# Patient Record
Sex: Male | Born: 1940 | Race: Black or African American | Hispanic: No | Smoking: Former smoker
Health system: Southern US, Community
[De-identification: ages and names within clinical notes are randomized; demographics above are authoritative.]

## PROBLEM LIST (undated history)

## (undated) DIAGNOSIS — IMO0002 Reserved for concepts with insufficient information to code with codable children: Secondary | ICD-10-CM

## (undated) DIAGNOSIS — M1990 Unspecified osteoarthritis, unspecified site: Secondary | ICD-10-CM

## (undated) DIAGNOSIS — J449 Chronic obstructive pulmonary disease, unspecified: Secondary | ICD-10-CM

## (undated) DIAGNOSIS — Z720 Tobacco use: Secondary | ICD-10-CM

## (undated) DIAGNOSIS — R55 Syncope and collapse: Secondary | ICD-10-CM

## (undated) DIAGNOSIS — I2510 Atherosclerotic heart disease of native coronary artery without angina pectoris: Secondary | ICD-10-CM

## (undated) DIAGNOSIS — I219 Acute myocardial infarction, unspecified: Secondary | ICD-10-CM

## (undated) DIAGNOSIS — E785 Hyperlipidemia, unspecified: Secondary | ICD-10-CM

## (undated) DIAGNOSIS — I10 Essential (primary) hypertension: Secondary | ICD-10-CM

## (undated) DIAGNOSIS — N183 Chronic kidney disease, stage 3 unspecified: Secondary | ICD-10-CM

## (undated) DIAGNOSIS — I509 Heart failure, unspecified: Secondary | ICD-10-CM

## (undated) DIAGNOSIS — I82409 Acute embolism and thrombosis of unspecified deep veins of unspecified lower extremity: Secondary | ICD-10-CM

## (undated) DIAGNOSIS — R0600 Dyspnea, unspecified: Secondary | ICD-10-CM

## (undated) DIAGNOSIS — R1030 Lower abdominal pain, unspecified: Secondary | ICD-10-CM

## (undated) DIAGNOSIS — R9430 Abnormal result of cardiovascular function study, unspecified: Secondary | ICD-10-CM

## (undated) HISTORY — DX: Lower abdominal pain, unspecified: R10.30

## (undated) HISTORY — DX: Abnormal result of cardiovascular function study, unspecified: R94.30

## (undated) HISTORY — DX: Heart failure, unspecified: I50.9

## (undated) HISTORY — DX: Acute myocardial infarction, unspecified: I21.9

## (undated) HISTORY — DX: Chronic kidney disease, stage 3 (moderate): N18.3

## (undated) HISTORY — DX: Syncope and collapse: R55

## (undated) HISTORY — PX: MULTIPLE TOOTH EXTRACTIONS: SHX2053

## (undated) HISTORY — DX: Reserved for concepts with insufficient information to code with codable children: IMO0002

## (undated) HISTORY — PX: EYE SURGERY: SHX253

## (undated) HISTORY — DX: Hyperlipidemia, unspecified: E78.5

## (undated) HISTORY — DX: Tobacco use: Z72.0

## (undated) HISTORY — DX: Acute embolism and thrombosis of unspecified deep veins of unspecified lower extremity: I82.409

## (undated) HISTORY — DX: Atherosclerotic heart disease of native coronary artery without angina pectoris: I25.10

## (undated) HISTORY — DX: Chronic kidney disease, stage 3 unspecified: N18.30

## (undated) HISTORY — DX: Chronic obstructive pulmonary disease, unspecified: J44.9

## (undated) HISTORY — DX: Essential (primary) hypertension: I10

---

## 2004-12-07 DIAGNOSIS — I219 Acute myocardial infarction, unspecified: Secondary | ICD-10-CM

## 2004-12-07 HISTORY — DX: Acute myocardial infarction, unspecified: I21.9

## 2004-12-07 HISTORY — PX: CORONARY ANGIOPLASTY: SHX604

## 2005-04-19 ENCOUNTER — Inpatient Hospital Stay (HOSPITAL_COMMUNITY): Admission: EM | Admit: 2005-04-19 | Discharge: 2005-04-21 | Payer: Self-pay | Admitting: Emergency Medicine

## 2005-04-20 ENCOUNTER — Ambulatory Visit: Payer: Self-pay | Admitting: *Deleted

## 2005-04-21 ENCOUNTER — Ambulatory Visit: Payer: Self-pay | Admitting: Internal Medicine

## 2005-04-22 ENCOUNTER — Ambulatory Visit: Payer: Self-pay | Admitting: *Deleted

## 2005-05-12 ENCOUNTER — Ambulatory Visit: Payer: Self-pay | Admitting: Internal Medicine

## 2005-05-14 ENCOUNTER — Ambulatory Visit: Payer: Self-pay | Admitting: Cardiology

## 2005-06-03 ENCOUNTER — Ambulatory Visit: Payer: Self-pay | Admitting: Internal Medicine

## 2005-08-28 ENCOUNTER — Ambulatory Visit: Payer: Self-pay | Admitting: Cardiology

## 2005-09-02 ENCOUNTER — Ambulatory Visit: Payer: Self-pay | Admitting: Internal Medicine

## 2005-09-15 ENCOUNTER — Encounter: Payer: Self-pay | Admitting: Cardiology

## 2005-09-15 ENCOUNTER — Ambulatory Visit (HOSPITAL_COMMUNITY): Admission: RE | Admit: 2005-09-15 | Discharge: 2005-09-15 | Payer: Self-pay | Admitting: Cardiology

## 2005-09-15 ENCOUNTER — Ambulatory Visit: Payer: Self-pay | Admitting: Cardiology

## 2005-09-16 ENCOUNTER — Ambulatory Visit: Payer: Self-pay | Admitting: Internal Medicine

## 2005-10-13 ENCOUNTER — Ambulatory Visit: Payer: Self-pay | Admitting: Internal Medicine

## 2005-12-18 ENCOUNTER — Ambulatory Visit: Payer: Self-pay | Admitting: Cardiology

## 2006-09-15 ENCOUNTER — Ambulatory Visit: Payer: Self-pay | Admitting: Cardiology

## 2006-09-16 ENCOUNTER — Ambulatory Visit: Payer: Self-pay | Admitting: Internal Medicine

## 2007-01-27 ENCOUNTER — Ambulatory Visit: Payer: Self-pay | Admitting: Cardiology

## 2007-08-17 ENCOUNTER — Ambulatory Visit: Payer: Self-pay | Admitting: Cardiology

## 2008-03-05 ENCOUNTER — Ambulatory Visit: Payer: Self-pay | Admitting: Cardiology

## 2009-06-28 ENCOUNTER — Encounter (INDEPENDENT_AMBULATORY_CARE_PROVIDER_SITE_OTHER): Payer: Self-pay | Admitting: *Deleted

## 2010-02-21 DIAGNOSIS — I10 Essential (primary) hypertension: Secondary | ICD-10-CM | POA: Insufficient documentation

## 2010-02-23 ENCOUNTER — Encounter: Payer: Self-pay | Admitting: Cardiology

## 2010-02-23 DIAGNOSIS — F17200 Nicotine dependence, unspecified, uncomplicated: Secondary | ICD-10-CM | POA: Insufficient documentation

## 2010-02-23 DIAGNOSIS — E785 Hyperlipidemia, unspecified: Secondary | ICD-10-CM | POA: Insufficient documentation

## 2010-02-24 ENCOUNTER — Ambulatory Visit: Payer: Self-pay | Admitting: Cardiology

## 2010-11-19 ENCOUNTER — Emergency Department (HOSPITAL_COMMUNITY)
Admission: EM | Admit: 2010-11-19 | Discharge: 2010-11-19 | Disposition: A | Discharge status: Other Acute Inpatient Hospital | Payer: Self-pay | Source: Home / Self Care | Admitting: Family Medicine

## 2010-11-19 ENCOUNTER — Encounter (INDEPENDENT_AMBULATORY_CARE_PROVIDER_SITE_OTHER): Payer: Self-pay | Admitting: Emergency Medicine

## 2010-11-19 ENCOUNTER — Observation Stay (HOSPITAL_COMMUNITY)
Admission: EM | Admit: 2010-11-19 | Discharge: 2010-11-20 | Payer: Self-pay | Source: Home / Self Care | Attending: Emergency Medicine | Admitting: Emergency Medicine

## 2010-11-21 ENCOUNTER — Ambulatory Visit: Payer: Self-pay | Admitting: Cardiology

## 2010-11-21 LAB — CONVERTED CEMR LAB
INR: 1.2
POC INR: 1.2

## 2010-11-25 ENCOUNTER — Ambulatory Visit: Payer: Self-pay | Admitting: Cardiology

## 2010-11-25 LAB — CONVERTED CEMR LAB: POC INR: 1.8

## 2010-12-02 ENCOUNTER — Ambulatory Visit: Payer: Self-pay | Admitting: Internal Medicine

## 2010-12-02 LAB — CONVERTED CEMR LAB: POC INR: 2.5

## 2010-12-16 ENCOUNTER — Encounter: Payer: Self-pay | Admitting: Cardiology

## 2010-12-16 ENCOUNTER — Emergency Department (HOSPITAL_COMMUNITY)
Admission: EM | Admit: 2010-12-16 | Discharge: 2010-12-16 | Payer: Self-pay | Source: Home / Self Care | Admitting: Emergency Medicine

## 2010-12-18 ENCOUNTER — Ambulatory Visit: Admission: RE | Admit: 2010-12-18 | Discharge: 2010-12-18 | Payer: Self-pay | Source: Home / Self Care

## 2010-12-18 LAB — CONVERTED CEMR LAB: POC INR: 1.6

## 2010-12-22 LAB — COMPREHENSIVE METABOLIC PANEL
ALT: 18 U/L (ref 0–53)
AST: 25 U/L (ref 0–37)
Albumin: 3.5 g/dL (ref 3.5–5.2)
Alkaline Phosphatase: 129 U/L — ABNORMAL HIGH (ref 39–117)
BUN: 5 mg/dL — ABNORMAL LOW (ref 6–23)
CO2: 23 mEq/L (ref 19–32)
Calcium: 8.8 mg/dL (ref 8.4–10.5)
Chloride: 106 mEq/L (ref 96–112)
Creatinine, Ser: 1.2 mg/dL (ref 0.4–1.5)
GFR calc Af Amer: >60 mL/min (ref >60)
GFR calc non Af Amer: >60 mL/min (ref >60)
Glucose, Bld: 111 mg/dL — ABNORMAL HIGH (ref 70–99)
Potassium: 5 mEq/L (ref 3.5–5.1)
Sodium: 137 mEq/L (ref 135–145)
Total Bilirubin: 0.8 mg/dL (ref 0.3–1.2)
Total Protein: 7.3 g/dL (ref 6.0–8.3)

## 2010-12-22 LAB — DIFFERENTIAL
Basophils Absolute: 0 10*3/uL (ref 0.0–0.1)
Basophils Relative: 1 % (ref 0–1)
Eosinophils Absolute: 0.2 10*3/uL (ref 0.0–0.7)
Eosinophils Relative: 5 % (ref 0–5)
Lymphocytes Relative: 26 % (ref 12–46)
Lymphs Abs: 1.1 10*3/uL (ref 0.7–4.0)
Monocytes Absolute: 0.6 10*3/uL (ref 0.1–1.0)
Monocytes Relative: 14 % — ABNORMAL HIGH (ref 3–12)
Neutro Abs: 2.3 10*3/uL (ref 1.7–7.7)
Neutrophils Relative %: 54 % (ref 43–77)
Smear Review: ADEQUATE

## 2010-12-22 LAB — CBC
HCT: 55.4 % — ABNORMAL HIGH (ref 39.0–52.0)
Hemoglobin: 19.5 g/dL — ABNORMAL HIGH (ref 13.0–17.0)
MCH: 39.2 pg — ABNORMAL HIGH (ref 26.0–34.0)
MCHC: 35.2 g/dL (ref 30.0–36.0)
MCV: 111.5 fL — ABNORMAL HIGH (ref 78.0–100.0)
Platelets: 154 10*3/uL (ref 150–400)
RBC: 4.97 MIL/uL (ref 4.22–5.81)
RDW: 12.2 % (ref 11.5–15.5)
WBC: 4.2 10*3/uL (ref 4.0–10.5)

## 2010-12-22 LAB — POCT CARDIAC MARKERS
CKMB, poc: <1 ng/mL — ABNORMAL LOW (ref 1.0–8.0)
Myoglobin, poc: 53.3 ng/mL (ref 12–200)
Troponin i, poc: <0.05 ng/mL (ref 0.00–0.09)

## 2010-12-22 LAB — PROTIME-INR
INR: 1.66 — ABNORMAL HIGH (ref 0.00–1.49)
Prothrombin Time: 19.8 seconds — ABNORMAL HIGH (ref 11.6–15.2)

## 2010-12-23 ENCOUNTER — Encounter: Payer: Self-pay | Admitting: Cardiology

## 2010-12-23 ENCOUNTER — Ambulatory Visit
Admission: RE | Admit: 2010-12-23 | Discharge: 2010-12-23 | Payer: Self-pay | Source: Home / Self Care | Attending: Cardiology | Admitting: Cardiology

## 2010-12-23 DIAGNOSIS — R079 Chest pain, unspecified: Secondary | ICD-10-CM | POA: Insufficient documentation

## 2010-12-30 ENCOUNTER — Telehealth (INDEPENDENT_AMBULATORY_CARE_PROVIDER_SITE_OTHER): Payer: Self-pay | Admitting: *Deleted

## 2010-12-31 ENCOUNTER — Ambulatory Visit
Admission: RE | Admit: 2010-12-31 | Discharge: 2010-12-31 | Payer: Self-pay | Source: Home / Self Care | Attending: Cardiovascular Disease | Admitting: Cardiovascular Disease

## 2010-12-31 ENCOUNTER — Encounter: Payer: Self-pay | Admitting: Cardiology

## 2010-12-31 ENCOUNTER — Encounter: Payer: Self-pay | Admitting: *Deleted

## 2010-12-31 ENCOUNTER — Encounter (HOSPITAL_COMMUNITY)
Admission: RE | Admit: 2010-12-31 | Discharge: 2011-01-06 | Payer: Self-pay | Source: Home / Self Care | Attending: Cardiology | Admitting: Cardiology

## 2010-12-31 ENCOUNTER — Ambulatory Visit: Admission: RE | Admit: 2010-12-31 | Discharge: 2010-12-31 | Payer: Self-pay | Source: Home / Self Care

## 2010-12-31 LAB — CONVERTED CEMR LAB: POC INR: 2.2

## 2011-01-01 ENCOUNTER — Ambulatory Visit: Admit: 2011-01-01 | Payer: Self-pay

## 2011-01-06 NOTE — Miscellaneous (Signed)
  Clinical Lists Changes  Problems: Added new problem of CAD (ICD-414.00) Added new problem of DYSLIPIDEMIA (ICD-272.4) Added new problem of TOBACCO ABUSE (ICD-305.1) Observations: Added new observation of PAST MED HX: HYPERTENSION, UNSPECIFIED  CAD...Marland KitchenMarland KitchenNon-ST elevation MI in May 2006 treated with a bare metal stent.  LV   mild dysfunction...echo..09/2005...50-55%...tech difficult Lipid abnormalities Tobacco abuse Etoh use (02/23/2010 20:39)       Past History:  Past Medical History: HYPERTENSION, UNSPECIFIED  CAD...Marland KitchenMarland KitchenNon-ST elevation MI in May 2006 treated with a bare metal stent.  LV   mild dysfunction...echo..09/2005...50-55%...tech difficult Lipid abnormalities Tobacco abuse Etoh use

## 2011-01-06 NOTE — Assessment & Plan Note (Signed)
Summary: F2Y  Medications Added ASPIRIN 81 MG TBEC (ASPIRIN) Take one tablet by mouth daily      Allergies Added: NKDA  Visit Type:  Follow-up Primary Provider:  [facility]  CC:  CAD.  History of Present Illness: The patient is seen for followup of coronary artery disease.  He had a non-ST elevation MI in 2006.  He received a bare-metal stent at that time. He has not had any recurrent chest pain since then.  Current Medications (verified): 1)  Metoprolol Succinate 50 Mg Xr24h-Tab (Metoprolol Succinate) .... Take One Tablet By Mouth Daily 2)  Lisinopril 10 Mg Tabs (Lisinopril) .... Take One Tablet By Mouth Daily 3)  Furosemide 20 Mg Tabs (Furosemide) .... Take One Tablet By Mouth Daily. 4)  Zetia 10 Mg Tabs (Ezetimibe) .... Take One Tablet By Mouth Daily. 5)  Aspirin 81 Mg Tbec (Aspirin) .... Take One Tablet By Mouth Daily  Allergies (verified): No Known Drug Allergies  Past History:  Past Medical History: Last updated: 02/23/2010 HYPERTENSION, UNSPECIFIED  CAD...Marland KitchenMarland KitchenNon-ST elevation MI in May 2006 treated with a bare metal stent.  LV   mild dysfunction...echo..09/2005...50-55%...tech difficult Lipid abnormalities Tobacco abuse Etoh use  Review of Systems       Patient denies fever, chills, headache, sweats, rash, change in vision, change in hearing, chest pain, cough, shortness of breath, nausea vomiting, urinary symptoms.  All other systems are reviewed and are negative.  Vital Signs:  Patient profile:   70 year old male Height:      71 inches Weight:      167 pounds BMI:     23.38 Pulse rate:   68 / minute BP sitting:   142 / 70  (left arm) Cuff size:   regular  Vitals Entered By: Hardin Negus, RMA (February 24, 2010 11:40 AM)  Physical Exam  General:  patient is stable. Eyes:  no xanthelasma. Neck:  no jugular distention. Lungs:  lungs are clear respiratory effort is nonlabored. Heart:  cardiac exam reveals S1 and S2.  No clicks or significant  murmurs. Abdomen:  abdomen is soft. Extremities:  no peripheral edema. Psych:  patient is oriented to person time and place.  Affect is normal.   Impression & Recommendations:  Problem # 1:  TOBACCO ABUSE (ICD-305.1) I have spoken with the patient and counseled him to stop smoking.  He says that he has cut back but he has not yet stopped.  Problem # 2:  DYSLIPIDEMIA (ICD-272.4)  His updated medication list for this problem includes:    Zetia 10 Mg Tabs (Ezetimibe) .Marland Kitchen... Take one tablet by mouth daily. Patient is on Zetia.  Would be optimal for him to be on a statin if possible  Problem # 3:  CAD (ICD-414.00)  His updated medication list for this problem includes:    Metoprolol Succinate 50 Mg Xr24h-tab (Metoprolol succinate) .Marland Kitchen... Take one tablet by mouth daily    Lisinopril 10 Mg Tabs (Lisinopril) .Marland Kitchen... Take one tablet by mouth daily    Aspirin 81 Mg Tbec (Aspirin) .Marland Kitchen... Take one tablet by mouth daily  Orders: EKG w/ Interpretation (93000) Coronary disease is stable.  EKG is done today reviewed by me.  His left axis deviation.  No further workup is needed.  Patient Instructions: 1)  Follow up in 1 year

## 2011-01-08 NOTE — Progress Notes (Signed)
Summary: nuc pre procedure  Phone Note Outgoing Call Call back at Home Phone [phone]   Call placed by: Cathlyn Parsons RN,  December 30, 2010 3:21 PM Call placed to: Patient Reason for Call: Confirm/change Appt Summary of Call: Left message with information on Myoview Information Sheet (see scanned document for details).      Nuclear Med Background Indications for Stress Test: Evaluation for Ischemia, Stent Patency, Post Hospital  Indications Comments: 12/19/10 [facility] with CP  History: Echo, Heart Catheterization, Myocardial Infarction, Stents  History Comments: 06 Echo: EF=50-55%,MI,Cath and Stent of RCA/LAD  Symptoms: Chest Pain with Exertion, Near Syncope  Symptoms Comments: Hx of DVT   Nuclear Pre-Procedure Cardiac Risk Factors: Hypertension, Lipids, Smoker Height (in): 71  Nuclear Med Study Referring MD:  Myrtis Ser MD, Leotis Shames

## 2011-01-08 NOTE — Medication Information (Signed)
Summary: rov  Anticoagulant Therapy  Managed by: Bethena Midget, RN, BSN Referring MD: Myrtis Ser MD, Leotis Shames [facility]CP: [facility] Supervising MD: Daleen Squibb MD,Thomas Indication 1: Acute MI Lab Used: [facility] [place] Site: [address] INR POC 1.8 INR RANGE 2-3  Dietary changes: no    Health status changes: no    Bleeding/hemorrhagic complications: no    Recent/future hospitalizations: no    Any changes in medication regimen? no    Recent/future dental: no  Any missed doses?: no       Is patient compliant with meds? yes      Comments: Finished Lovenox injection yesterday morning  Allergies: No Known Drug Allergies  Anticoagulation Management History:      The patient is taking warfarin and comes in today for a routine follow up visit.  Positive risk factors for bleeding include an age of 68 years or older.  The bleeding index is 'intermediate risk'.  Positive CHADS2 values include History of HTN.  Negative CHADS2 values include Age > 60 years old.  His last INR was 1.2.  Anticoagulation responsible provider: Wall MD,Thomas.  INR POC: 1.8.  Cuvette Lot#: [id number].  Exp: 12/2011.    Anticoagulation Management Assessment/Plan:      The next INR is due 12/02/2010.  Anticoagulation instructions were given to patient.  Results were reviewed/authorized by Bethena Midget, RN, BSN.  He was notified by Bethena Midget, RN, BSN.         Prior Anticoagulation Instructions: INR 1.2 (goal 2-3)  Continue taking the Lovenox shots until we see you again on Tuesday.  Continue taking 1 tablet of warfarin daily.  Return to clinic on Tuesday, December 20th at 2:45PM.  Current Anticoagulation Instructions: INR 1.8 Change dose to 1 pill everyday except 1.5 pills on Tuesdays and Saturdays per Dr Abner Greenspan. . Recheck in one week.

## 2011-01-08 NOTE — Medication Information (Signed)
Summary: rov/jb  Anticoagulant Therapy  Managed by: Weston Brass, PharmD Referring MD: Myrtis Ser MD, Leotis Shames [facility]CP: [facility] Supervising MD: Ladona Ridgel MD, Sharlot Gowda Indication 1: Acute MI Lab Used: [facility] [place] Site: [address] INR POC 1.6 INR RANGE 2-3  Dietary changes: no    Health status changes: no    Bleeding/hemorrhagic complications: no    Recent/future hospitalizations: yes       Details: Was in hospital two days ago for dehydration, received fluids, tests showed no other problems   Any changes in medication regimen? no    Recent/future dental: no  Any missed doses?: yes     Details: Missed last night  Is patient compliant with meds? yes      Comments: Pt states that he takes just 1 tablet every day   Allergies: No Known Drug Allergies  Anticoagulation Management History:      The patient is taking warfarin and comes in today for a routine follow up visit.  Positive risk factors for bleeding include an age of 24 years or older.  The bleeding index is 'intermediate risk'.  Positive CHADS2 values include History of HTN.  Negative CHADS2 values include Age > 45 years old.  His last INR was 1.2.  Anticoagulation responsible provider: Ladona Ridgel MD, Sharlot Gowda.  INR POC: 1.6.  Cuvette Lot#: [id number].  Exp: 01/2012.    Anticoagulation Management Assessment/Plan:      The patient's current anticoagulation dose is Warfarin sodium 5 mg tabs: Use as directed by Anticoagulation Clinic.  The next INR is due 01/01/2011.  Anticoagulation instructions were given to patient.  Results were reviewed/authorized by Weston Brass, PharmD.         Prior Anticoagulation Instructions: Cont on current regimen Return to clinic on Jan 12th, at 330 pm  Current Anticoagulation Instructions: INR 1.6  Coumadin 5 mg - Take 1.5 tablets today, then resume 1 tablet every day except 1.5 tablets on Thursdays

## 2011-01-08 NOTE — Medication Information (Signed)
Summary: rov/sp   Anticoagulant Therapy  Managed by: Windell Hummingbird, RN Referring MD: Myrtis Ser MD, Leotis Shames [facility]CP: [facility] Supervising MD: Patty Sermons, T Indication 1: Acute MI Lab Used: [facility] [place] Site: [address] INR POC 2.2 INR RANGE 2-3  Dietary changes: no    Health status changes: no    Bleeding/hemorrhagic complications: no    Recent/future hospitalizations: no    Any changes in medication regimen? no    Recent/future dental: no  Any missed doses?: yes     Details: Missed Sunday's dose  Is patient compliant with meds? yes       Allergies: No Known Drug Allergies  Anticoagulation Management History:      Positive risk factors for bleeding include an age of 62 years or older.  The bleeding index is 'intermediate risk'.  Positive CHADS2 values include History of HTN.  Negative CHADS2 values include Age > 37 years old.  His last INR was 1.2.  Anticoagulation responsible provider: Anikka Marsan, T.  INR POC: 2.2.  Cuvette Lot#: [id number].  Exp: 12/2011.    Anticoagulation Management Assessment/Plan:      The patient's current anticoagulation dose is Warfarin sodium 5 mg tabs: Use as directed by Anticoagulation Clinic.  The next INR is due 01/28/2011.  Anticoagulation instructions were given to patient.  Results were reviewed/authorized by Windell Hummingbird, RN.  He was notified by Windell Hummingbird, RN.         Prior Anticoagulation Instructions: INR 1.6  Coumadin 5 mg - Take 1.5 tablets today, then resume 1 tablet every day except 1.5 tablets on Thursdays   Current Anticoagulation Instructions: INR 2.2 Continue to take 1 tablet every day except 1.5 tablets on Thursdays. Recheck in 4 weeks.

## 2011-01-08 NOTE — Medication Information (Signed)
Summary: ccn.gd   Anticoagulant Therapy  Managed by: Louann Sjogren, PharmD Referring MD: Myrtis Ser MD, Leotis Shames [facility]CP: [facility] Supervising MD: Daleen Squibb MD,Thomas Indication 1: Acute MI INR POC 1.2 INR RANGE 2-3  Dietary changes: no    Health status changes: no    Bleeding/hemorrhagic complications: no    Recent/future hospitalizations: no    Any changes in medication regimen? yes       Details: New start coumadin  Recent/future dental: no  Any missed doses?: no       Is patient compliant with meds? yes      Comments: New start coumadin after discharge from hospital with acute MI.  H/o of MI when stents.  Was on ASA 81mg  daily.  Allergies: No Known Drug Allergies  Anticoagulation Management History:      The patient comes in today for his initial visit for anticoagulation therapy.  Positive risk factors for bleeding include an age of 68 years or older.  The bleeding index is 'intermediate risk'.  Positive CHADS2 values include History of HTN.  Negative CHADS2 values include Age > 53 years old.  Today's INR is 1.2.  Anticoagulation responsible provider: Wall MD,Thomas.  INR POC: 1.2.  Cuvette Lot#: [id number].    Anticoagulation Management Assessment/Plan:      The next INR is due 11/25/2010.  Results were reviewed/authorized by Louann Sjogren, PharmD.         Current Anticoagulation Instructions: INR 1.2 (goal 2-3)  Continue taking the Lovenox shots until we see you again on Tuesday.  Continue taking 1 tablet of warfarin daily.  Return to clinic on Tuesday, December 20th at 2:45PM.

## 2011-01-08 NOTE — Assessment & Plan Note (Addendum)
Summary: Cardiology Nuclear Testing  Nuclear Med Background Indications for Stress Test: Evaluation for Ischemia, Stent Patency, Post Hospital  Indications Comments: 12/19/10 [facility] with CP  History: Echo, Heart Catheterization, Myocardial Infarction, Stents  History Comments: '06 Echo:EF=50-55%, '06 MI>Stent-RCA/LAD  Symptoms: Chest Tightness, Dizziness, Near Syncope, Palpitations  Symptoms Comments: .   Nuclear Pre-Procedure Cardiac Risk Factors: Hypertension, Lipids, Smoker Caffeine/Decaff Intake: none NPO After: 9:00 PM Lungs: Clear.  O2 Sat 97% on RA. IV 0.9% NS with Angio Cath: 20g     IV Site: R Forearm IV Started by: Stanton Kidney, EMT-P Chest Size (in) 42     Height (in): 70 Weight (lb): 166 BMI: 23.90 Tech Comments: Metoprolol held this a.m.  Nuclear Med Study 1 or 2 day study:  1 day     Stress Test Type:  Eugenie Birks Reading MD:  Cassell Clement, MD     Referring MD:  Willa Rough, MD Resting Radionuclide:  Technetium 66m Tetrofosmin     Resting Radionuclide Dose:  10.6 mCi  Stress Radionuclide:  Technetium 27m Tetrofosmin     Stress Radionuclide Dose:  33 mCi   Stress Protocol   Lexiscan: 0.4 mg   Stress Test Technologist:  Rea [employer], CMA-N     Nuclear Technologist:  Doyne Keel, CNMT  Rest Procedure  Myocardial perfusion imaging was performed at rest 45 minutes following the intravenous administration of Technetium 44m Tetrofosmin.  Stress Procedure  The patient received IV Lexiscan 0.4 mg over 15-seconds.  Technetium 41m Tetrofosmin injected at 30-seconds.  There were no significant changes with infusion, other than occasional PVC's.  Quantitative spect images were obtained after a 45 minute delay.  QPS Raw Data Images:  Normal; no motion artifact; normal heart/lung ratio. Stress Images:  Normal homogeneous uptake in all areas of the myocardium. Rest Images:  Normal homogeneous uptake in all areas of the myocardium. Subtraction (SDS):  No evidence of  ischemia. Transient Ischemic Dilatation:  .87  (Normal <1.22)  Lung/Heart Ratio:  .30  (Normal <0.45)  Quantitative Gated Spect Images QGS EDV:  85 ml QGS ESV:  23 ml QGS EF:  72 %  Findings Normal nuclear study      Overall Impression  Exercise Capacity: Lexiscan with no exercise. BP Response: Normal blood pressure response. Clinical Symptoms: Dyspnea. ECG Impression: No significant ST segment change suggestive of ischemia. Overall Impression: Normal stress nuclear study.No ischemia.  Normal LV systolic function.  Appended Document: Cardiology Nuclear Testing Good  Appended Document: Cardiology Nuclear Testing attempted to call pt, unable to leave a VM  Appended Document: Cardiology Nuclear Testing Left message to call back   Appended Document: Cardiology Nuclear Testing pt aware

## 2011-01-08 NOTE — Assessment & Plan Note (Signed)
Summary: sob/mild chest pain off and on/mt      Allergies Added: NKDA  Visit Type:  Follow-up Primary Provider:  [facility]  CC:  CAD.  History of Present Illness: The patient is seen for followup of coronary artery disease.  I saw him last in March, 2011.  He had an ST elevation MI in 2006.  He received a bare-metal stent.  Until recently he had not had any recurrent chest pain.  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  I do not know who made this diagnosis.  He was placed on Coumadin and is followed in our Coumadin clinic.  On December 16, 2010 patient was seen in the emergency room.  He had significant diarrhea and was dehydrated with presyncope.  CT Scan ruled out pulmonary embolus.  Chest x-ray revealed COPD.  EKG question old inferior MI.  He was felt to be stable and was allowed to go home.  He felt better slowly over several days.  On December 19, 2010 he did have some chest pain.  It occurred with some exertion.  He feels better now.  Current Medications (verified): 1)  Metoprolol Succinate 50 Mg Xr24h-Tab (Metoprolol Succinate) .... Take One Tablet By Mouth Daily 2)  Lisinopril 10 Mg Tabs (Lisinopril) .... Take One Tablet By Mouth Daily 3)  Furosemide 20 Mg Tabs (Furosemide) .... Take One Tablet By Mouth Daily. 4)  Zetia 10 Mg Tabs (Ezetimibe) .... Take One Tablet By Mouth Daily. 5)  Aspirin 81 Mg Tbec (Aspirin) .... Take One Tablet By Mouth Daily 6)  Warfarin Sodium 5 Mg Tabs (Warfarin Sodium) .... Use As Directed By Anticoagulation Clinic  Allergies (verified): No Known Drug Allergies  Past History:  Past Medical History: HYPERTENSION, UNSPECIFIED  CAD...Marland KitchenMarland KitchenNon-ST elevation MI in May 2006 treated with a bare metal stent.  /  chest pain December 19, 2009 LV   mild dysfunction...echo..09/2005...50-55%...tech difficult Lipid abnormalities Tobacco abuse Etoh use Presyncope    ER visit January, 2011....  dehydration DVT    diagnosis December, 2011.... Coumadin therapy  Review of Systems       Patient denies fever, chills, headache, sweats, rash, change in vision, change in hearing, cough, nausea vomiting, urinary symptoms. All of the systems are reviewed and are negative.  Vital Signs:  Patient profile:   70 year old male Height:      71 inches Weight:      165 pounds BMI:     23.10 Pulse rate:   80 / minute BP sitting:   128 / 64  (left arm) Cuff size:   regular  Vitals Entered By: Hardin Negus, RMA (December 23, 2010 9:10 AM)  Physical Exam  General:  Patient is stable in general. Head:  head is atraumatic. Eyes:  no xanthelasma. Mouth:  poor dentition with multiple missing teeth. Neck:  no jugular venous distention. Chest Wall:  no chest wall tenderness. Lungs:  lungs reveal decreased breath sounds.  There are no rales. Heart:  cardiac exam reveals S1-S2.  No clicks or significant murmurs. Abdomen:  abdomen soft. Msk:  no musculoskeletal deformities. Extremities:  mild swelling in the right lower extremity. Skin:  no skin rashes. Psych:  patient is oriented to person time and place.  Affect is normal.   Impression & Recommendations:  Problem # 1:  TOBACCO ABUSE (ICD-305.1) The patient continues to use tobacco products.  I have counseled him to stop.  Problem # 2:  DYSLIPIDEMIA (ICD-272.4)  His updated medication list for this problem includes:    Zetia 10 Mg Tabs (Ezetimibe) .Marland Kitchen... Take one tablet by mouth daily. By history I have encouraged the patient to be on a statin in the past. Our research team is talking to him today about the possibility of a drug study involving the use of a statin and other meds.  He is considering.  Problem # 3:  CAD (ICD-414.00)  His updated medication list for this problem includes:    Metoprolol Succinate 50 Mg Xr24h-tab (Metoprolol succinate) .Marland Kitchen... Take one tablet by mouth daily    Lisinopril 10 Mg Tabs (Lisinopril) .Marland Kitchen... Take one  tablet by mouth daily    Aspirin 81 Mg Tbec (Aspirin) .Marland Kitchen... Take one tablet by mouth daily    Warfarin Sodium 5 Mg Tabs (Warfarin sodium) ..... Use as directed by anticoagulation clinic The patient has recently had some chest discomfort.  He says it was with exertion.  He has known coronary artery disease.  He has not had any type of exercise testing since 2006.  EKG is done today and reviewed by me. There is a Q wave in lead 3.  This is similar to the tracing of December 16, 2010 and similar to prior tracings.  There is no acute change. He Keysean have a nuclear exercise study to be sure that he does not have large areas of ischemia.  I Sherril then see him for followup.  Orders: EKG w/ Interpretation (93000) Nuclear Stress Test (Nuc Stress Test)  Problem # 4:  HYPERTENSION, UNSPECIFIED (ICD-401.9)  His updated medication list for this problem includes:    Metoprolol Succinate 50 Mg Xr24h-tab (Metoprolol succinate) .Marland Kitchen... Take one tablet by mouth daily    Lisinopril 10 Mg Tabs (Lisinopril) .Marland Kitchen... Take one tablet by mouth daily    Furosemide 20 Mg Tabs (Furosemide) .Marland Kitchen... Take one tablet by mouth daily.    Aspirin 81 Mg Tbec (Aspirin) .Marland Kitchen... Take one tablet by mouth daily Blood pressure is controlled today.  No change in therapy.  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  I do not have the data concerning his diagnosis.  He is on Coumadin.  Records Nickolai be reviewed further to see the recommended length of therapy.  As part of his evaluation I have reviewed the old records.  I have reviewed also the records from his emergency room visit on December 16, 2010.  Patient Instructions: 1)  Your physician has requested that you have a Lexiscan myoview.  For further information please visit [email or web address]  Please follow instruction sheet, as given. 2)  Follow up in 3-4 weeks

## 2011-01-08 NOTE — Medication Information (Signed)
Summary: rov/tm      Allergies Added: NKDA Anticoagulant Therapy  Managed by: Samantha Crimes, PharmD Referring MD: Myrtis Ser MD, Leotis Shames [facility]CP: [facility] Supervising MD: Graciela Husbands MD, Viviann Spare Indication 1: Acute MI Lab Used: [facility] [place] Site: [address] INR POC 2.5 INR RANGE 2-3  Dietary changes: no    Health status changes: no    Bleeding/hemorrhagic complications: no    Recent/future hospitalizations: no    Any changes in medication regimen? no    Recent/future dental: no  Any missed doses?: no       Is patient compliant with meds? yes       Current Medications (verified): 1)  Metoprolol Succinate 50 Mg Xr24h-Tab (Metoprolol Succinate) .... Take One Tablet By Mouth Daily 2)  Lisinopril 10 Mg Tabs (Lisinopril) .... Take One Tablet By Mouth Daily 3)  Furosemide 20 Mg Tabs (Furosemide) .... Take One Tablet By Mouth Daily. 4)  Zetia 10 Mg Tabs (Ezetimibe) .... Take One Tablet By Mouth Daily. 5)  Aspirin 81 Mg Tbec (Aspirin) .... Take One Tablet By Mouth Daily 6)  Warfarin Sodium 5 Mg Tabs (Warfarin Sodium) .... Use As Directed By Anticoagulation Clinic  Allergies (verified): No Known Drug Allergies  Anticoagulation Management History:      Positive risk factors for bleeding include an age of 16 years or older.  The bleeding index is 'intermediate risk'.  Positive CHADS2 values include History of HTN.  Negative CHADS2 values include Age > 41 years old.  His last INR was 1.2.  Anticoagulation responsible provider: Graciela Husbands MD, Viviann Spare.  INR POC: 2.5.  Exp: 12/2011.    Anticoagulation Management Assessment/Plan:      The patient's current anticoagulation dose is Warfarin sodium 5 mg tabs: Use as directed by Anticoagulation Clinic.  The next INR is due 12/16/2010.  Anticoagulation instructions were given to patient.  Results were reviewed/authorized by Samantha Crimes, PharmD.         Prior Anticoagulation Instructions: INR 1.8 Change dose to 1 pill  everyday except 1.5 pills on Tuesdays and Saturdays per Dr Abner Greenspan. . Recheck in one week.   Current Anticoagulation Instructions: Cont on current regimen Return to clinic on Jan 12th, at 330 pm

## 2011-01-18 ENCOUNTER — Encounter: Payer: Self-pay | Admitting: Cardiology

## 2011-01-19 ENCOUNTER — Ambulatory Visit (INDEPENDENT_AMBULATORY_CARE_PROVIDER_SITE_OTHER): Payer: Medicare Other | Admitting: Cardiology

## 2011-01-19 ENCOUNTER — Encounter: Payer: Self-pay | Admitting: Cardiology

## 2011-01-19 DIAGNOSIS — I2510 Atherosclerotic heart disease of native coronary artery without angina pectoris: Secondary | ICD-10-CM

## 2011-01-27 ENCOUNTER — Encounter: Payer: Self-pay | Admitting: Cardiovascular Disease

## 2011-01-27 ENCOUNTER — Encounter (INDEPENDENT_AMBULATORY_CARE_PROVIDER_SITE_OTHER): Payer: Medicare Other

## 2011-01-27 DIAGNOSIS — Z7901 Long term (current) use of anticoagulants: Secondary | ICD-10-CM

## 2011-01-27 DIAGNOSIS — I824Y9 Acute embolism and thrombosis of unspecified deep veins of unspecified proximal lower extremity: Secondary | ICD-10-CM

## 2011-01-27 DIAGNOSIS — I82409 Acute embolism and thrombosis of unspecified deep veins of unspecified lower extremity: Secondary | ICD-10-CM

## 2011-01-27 LAB — CONVERTED CEMR LAB: POC INR: 2.3

## 2011-01-28 NOTE — Assessment & Plan Note (Signed)
Summary: 1 month rov/sl/ok per michelle/kl      Allergies Added: NKDA  Visit Type:  Follow-up Primary Provider:  [facility]  CC:  CAD.  History of Present Illness: The patient is seen today for followup coronary artery disease.  I saw him last December 23, 2010.  He had some chest discomfort during [facility] emergency room.  We decided to proceed with nuclear exercise testing to be sure that there had been no significant change since 2006.  Study was done December 31, 2010.  There was no ischemia.  LV function was normal.  He has not had any significant recurrent chest pain.  Current Medications (verified): 1)  Metoprolol Succinate 50 Mg Xr24h-Tab (Metoprolol Succinate) .... Take One Tablet By Mouth Daily 2)  Lisinopril 10 Mg Tabs (Lisinopril) .... Take One Tablet By Mouth Daily 3)  Furosemide 20 Mg Tabs (Furosemide) .... Take One Tablet By Mouth Daily. 4)  Zetia 10 Mg Tabs (Ezetimibe) .... Take One Tablet By Mouth Daily. 5)  Aspirin 81 Mg Tbec (Aspirin) .... Take One Tablet By Mouth Daily 6)  Warfarin Sodium 5 Mg Tabs (Warfarin Sodium) .... Use As Directed By Anticoagulation Clinic  Allergies (verified): No Known Drug Allergies  Past History:  Past Medical History: HYPERTENSION, UNSPECIFIED  CAD...Marland KitchenMarland KitchenNon-ST elevation MI in May 2006 treated with a bare metal stent.  /  chest pain December 19, 2009 /   nuclear  12/31/2010...no ischemia...EF  70%.. LV   mild dysfunction...echo..09/2005...50-55%...tech difficult Lipid abnormalities Tobacco abuse Etoh use Presyncope    ER visit January, 2011.... dehydration DVT    diagnosis December, 2011.... Coumadin therapy  Review of Systems       Patient denies fever, chills, headache, sweats, rash, change in vision, change in hearing, chest, cough, nausea vomiting, urinary symptoms.  All of the systems are reviewed and are negative.  Vital Signs:  Patient profile:   70 year old male Height:      70 inches Weight:      167 pounds BMI:      24.05 Pulse rate:   80 / minute BP sitting:   124 / 78  (left arm) Cuff size:   regular  Vitals Entered By: Hardin Negus, RMA (January 19, 2011 10:14 AM)  Physical Exam  General:  patient is stable today. Eyes:  no xanthelasma. Neck:  no jugular venous distention. Lungs:  lungs are clear.  Respiratory effort is nonlabored. Heart:  cardiac exam reveals S1 and S2.  No clicks or significant murmurs. Abdomen:  abdomen is soft. Neurologic:  no peripheral edema.   Impression & Recommendations:  Problem # 1:  CHEST PAIN (ICD-786.50) No further chest pain.  Nuclear scan shows no ischemia.  No further workup.  Problem # 2:  TOBACCO ABUSE (ICD-305.1) The patient does continue to smoke.  I once again counseled him to stop.  Problem # 3:  HYPERTENSION, UNSPECIFIED (ICD-401.9) Blood pressure is controlled today.  No change in therapy.  One-year followup.  Patient Instructions: 1)  Your physician wants you to follow-up in:  1 year.  You Marley receive a reminder letter in the mail two months in advance. If you don't receive a letter, please call our office to schedule the follow-up appointment.

## 2011-01-28 NOTE — Miscellaneous (Signed)
  Clinical Lists Changes  Observations: Added new observation of PAST MED HX: HYPERTENSION, UNSPECIFIED  CAD...Marland KitchenMarland KitchenNon-ST elevation MI in May 2006 treated with a bare metal stent.  /  chest pain December 19, 2009 /   nuclear  12/31/2010...no ischemia...EF  70% LV   mild dysfunction...echo..09/2005...50-55%...tech difficult Lipid abnormalities Tobacco abuse Etoh use Presyncope    ER visit January, 2011.... dehydration DVT    diagnosis December, 2011.... Coumadin therapy (01/18/2011 17:30) Added new observation of PRIMARY MD: [facility] (01/18/2011 17:30)       Past History:  Past Medical History: HYPERTENSION, UNSPECIFIED  CAD...Marland KitchenMarland KitchenNon-ST elevation MI in May 2006 treated with a bare metal stent.  /  chest pain December 19, 2009 /   nuclear  12/31/2010...no ischemia...EF  70% LV   mild dysfunction...echo..09/2005...50-55%...tech difficult Lipid abnormalities Tobacco abuse Etoh use Presyncope    ER visit January, 2011.... dehydration DVT    diagnosis December, 2011.... Coumadin therapy

## 2011-02-03 NOTE — Medication Information (Signed)
Summary: rov/tm   Anticoagulant Therapy  Managed by: Weston Brass, PharmD Referring MD: Willa Rough, MD PCP: [facility] Supervising MD: Excell Seltzer MD, Casimiro Needle Indication 1: Acute MI Lab Used: [facility] [place] Site: [address] INR POC 2.3 INR RANGE 2-3  Dietary changes: yes       Details: Ate more greens  Health status changes: no    Bleeding/hemorrhagic complications: no    Recent/future hospitalizations: no    Any changes in medication regimen? yes       Details: missed one dose 3 days ago  Recent/future dental: no  Any missed doses?: no       Is patient compliant with meds? yes       Allergies: No Known Drug Allergies  Anticoagulation Management History:      The patient is taking warfarin and comes in today for a routine follow up visit.  Positive risk factors for bleeding include an age of 24 years or older.  The bleeding index is 'intermediate risk'.  Positive CHADS2 values include History of HTN.  Negative CHADS2 values include Age > 53 years old.  His last INR was 1.2.  Anticoagulation responsible provider: Excell Seltzer MD, Casimiro Needle.  INR POC: 2.3.  Cuvette Lot#: [id number].  Exp: 12/2011.    Anticoagulation Management Assessment/Plan:      The patient's current anticoagulation dose is Warfarin sodium 5 mg tabs: Use as directed by Anticoagulation Clinic.  The next INR is due 02/24/2011.  Anticoagulation instructions were given to patient.  Results were reviewed/authorized by Weston Brass, PharmD.  He was notified by Margot Chimes PharmD Candidate.         Prior Anticoagulation Instructions: INR 2.2 Continue to take 1 tablet every day except 1.5 tablets on Thursdays. Recheck in 4 weeks.  Current Anticoagulation Instructions: INR 2.3  Continue your current dose of 1 tablet everyday except for Thursdays when you take 1 and 1/2 tablets.  Recheck INR in 4 weeks.

## 2011-02-16 LAB — BASIC METABOLIC PANEL WITH GFR
BUN: 5 mg/dL — ABNORMAL LOW (ref 6–23)
CO2: 25 meq/L (ref 19–32)
Calcium: 8.5 mg/dL (ref 8.4–10.5)
Chloride: 101 meq/L (ref 96–112)
Creatinine, Ser: 1.26 mg/dL (ref 0.4–1.5)
GFR calc non Af Amer: 57 mL/min — ABNORMAL LOW
Glucose, Bld: 87 mg/dL (ref 70–99)
Potassium: 4.2 meq/L (ref 3.5–5.1)
Sodium: 133 meq/L — ABNORMAL LOW (ref 135–145)

## 2011-02-16 LAB — CBC
HCT: 51.4 % (ref 39.0–52.0)
Hemoglobin: 18.2 g/dL — ABNORMAL HIGH (ref 13.0–17.0)
MCH: 39.9 pg — ABNORMAL HIGH (ref 26.0–34.0)
MCHC: 35.4 g/dL (ref 30.0–36.0)
MCV: 112.7 fL — ABNORMAL HIGH (ref 78.0–100.0)
Platelets: 158 10*3/uL (ref 150–400)
RBC: 4.56 MIL/uL (ref 4.22–5.81)
RDW: 11.5 % (ref 11.5–15.5)
WBC: 4.7 10*3/uL (ref 4.0–10.5)

## 2011-02-16 LAB — APTT: aPTT: 27 seconds (ref 24–37)

## 2011-02-16 LAB — PROTIME-INR
INR: 1.05 (ref 0.00–1.49)
Prothrombin Time: 13.9 seconds (ref 11.6–15.2)

## 2011-02-23 ENCOUNTER — Encounter: Payer: Self-pay | Admitting: Internal Medicine

## 2011-02-23 ENCOUNTER — Encounter (INDEPENDENT_AMBULATORY_CARE_PROVIDER_SITE_OTHER): Payer: Medicare Other

## 2011-02-23 DIAGNOSIS — I82409 Acute embolism and thrombosis of unspecified deep veins of unspecified lower extremity: Secondary | ICD-10-CM

## 2011-02-23 DIAGNOSIS — I80299 Phlebitis and thrombophlebitis of other deep vessels of unspecified lower extremity: Secondary | ICD-10-CM

## 2011-02-23 DIAGNOSIS — Z7901 Long term (current) use of anticoagulants: Secondary | ICD-10-CM

## 2011-02-23 LAB — CONVERTED CEMR LAB: POC INR: 2.4

## 2011-02-27 ENCOUNTER — Ambulatory Visit (INDEPENDENT_AMBULATORY_CARE_PROVIDER_SITE_OTHER): Payer: Medicare Other | Admitting: Family Medicine

## 2011-02-27 DIAGNOSIS — E785 Hyperlipidemia, unspecified: Secondary | ICD-10-CM

## 2011-02-27 DIAGNOSIS — I10 Essential (primary) hypertension: Secondary | ICD-10-CM

## 2011-02-27 DIAGNOSIS — F17200 Nicotine dependence, unspecified, uncomplicated: Secondary | ICD-10-CM

## 2011-02-27 DIAGNOSIS — I2510 Atherosclerotic heart disease of native coronary artery without angina pectoris: Secondary | ICD-10-CM

## 2011-02-27 DIAGNOSIS — Z79899 Other long term (current) drug therapy: Secondary | ICD-10-CM

## 2011-02-27 DIAGNOSIS — I82409 Acute embolism and thrombosis of unspecified deep veins of unspecified lower extremity: Secondary | ICD-10-CM

## 2011-02-27 NOTE — Patient Instructions (Signed)
It was great to meet you today! I would like you to schedule an appointment to come in and get some lab work done and your blood pressure checked sometime next week.  [name] have to be fasting for this. I want you to do some more thinking about screening for colon cancer.  We can talk about this at a later point. Congratulations on how much you have cut back on your smoking.  Keep up the good work!

## 2011-03-01 ENCOUNTER — Encounter: Payer: Self-pay | Admitting: Family Medicine

## 2011-03-01 NOTE — Assessment & Plan Note (Signed)
It does not appear that the patient has had a fasting lipid panel checked in some time. The patient is not fasting today. Craig Moore have the patient return sometime in the next two weeks for fasting labs.

## 2011-03-01 NOTE — Progress Notes (Signed)
Subjective: The patient is a 70 year old male who presents today for an initial appointment. He reports that he was generally in good health until 2006 when he suffered from a heart attack. He was hospitalized at that time, cardiac Was performed, he was found to have to blockages requires standing. At that time he was also diagnosed with high blood pressure, and hyperlipidemia. Approximately 3 months ago he suffered from acute onset swelling in his right foot. He went to the emergency department and was discovered to have a deep vein thrombosis.  He was placed on Coumadin and bridge with Lovenox.  His INR has been followed by his cardiologist Dr. Driscilla Moats, and his last check demonstrated an INR 2.4.  He reports no problems with new leg swelling, significant leg pain, shortness of breath, or cough. He reports that there has been some darkening of the skin on his right foot since his episode in December. Otherwise reports that he has smoked for many years but is down to a quarter pack per day and does have intentions of stopping altogether.  Review of systems: her HPI, otherwise 12 point review of systems was performed and was unremarkable  Objective:  Filed Vitals:   02/27/11 1355  BP: 168/94  Pulse: 72  Temp: 98.5 F (36.9 C)   Gen: NAD, alert, cooperative with exam HEENT: MMM, EOMI, PERRL CV: RRR, no MRG Resp: CTABL, no wheezes noted, prolonged expiratory phase, breath sounds somewhat diminished Abd: SNTND, BS present, no guarding or organomegaly Ext: No edema noted on the left, mild edema with slight darkening of skin on right foot, full ROM.  2+ pulses in left foot, 1+ pulses in right foot Neuro: Alert and oriented, CN II-XII grossly intact

## 2011-03-01 NOTE — Assessment & Plan Note (Signed)
The patient has a deep vein thrombosis with a single risk factor, namely smoking.  he Craig Moore require at least six months of anticoagulation. He is currently being followed by another Coumadin clinic, and wishes to continue being followed by them. I made the patient aware that we [place] we would be happy to follow his INR is he so decides at a future date.

## 2011-03-01 NOTE — Assessment & Plan Note (Signed)
The patient is currently mildly hypertensive, but maintains that he normally has a well-controlled blood pressure. We Craig Moore recheck the patient's blood pressure on a return visit next week when he gets fasting labs drawn. If it remains elevated at that visit, I Callie plan to have the patient keeping blood pressure log and consider increasing medication at his next visit.

## 2011-03-01 NOTE — Assessment & Plan Note (Signed)
Discuss the importance of smoking cessation with the patient. The patient is not interested in any pharmacologic therapy at this point in time. Inform the patient that we would be happy to help him in this area if he so desires at a later point. Craig Moore continue to check in the patient at regular intervals on this health concern.

## 2011-03-02 ENCOUNTER — Other Ambulatory Visit: Payer: Medicare Other

## 2011-03-02 ENCOUNTER — Ambulatory Visit (INDEPENDENT_AMBULATORY_CARE_PROVIDER_SITE_OTHER): Payer: Medicare Other | Admitting: *Deleted

## 2011-03-02 VITALS — BP 150/90 | HR 64

## 2011-03-02 DIAGNOSIS — Z79899 Other long term (current) drug therapy: Secondary | ICD-10-CM

## 2011-03-02 DIAGNOSIS — E785 Hyperlipidemia, unspecified: Secondary | ICD-10-CM

## 2011-03-02 DIAGNOSIS — I10 Essential (primary) hypertension: Secondary | ICD-10-CM

## 2011-03-02 NOTE — Progress Notes (Signed)
Patient in today for labs and BP check. BP  checked manually with regular adult cuff.  BP  LA 156/90 and RA 150/90 pulse 64. Vicky forward to MD for further instructions.

## 2011-03-02 NOTE — Progress Notes (Signed)
Drew pt for CMP and fasting Lipid. 2sst. AC

## 2011-03-03 LAB — COMPREHENSIVE METABOLIC PANEL
ALT: 18 U/L (ref 0–53)
AST: 28 U/L (ref 0–37)
Albumin: 3.8 g/dL (ref 3.5–5.2)
Alkaline Phosphatase: 112 U/L (ref 39–117)
BUN: 6 mg/dL (ref 6–23)
CO2: 22 mEq/L (ref 19–32)
Calcium: 8.5 mg/dL (ref 8.4–10.5)
Chloride: 103 mEq/L (ref 96–112)
Creat: 1.11 mg/dL (ref 0.40–1.50)
Glucose, Bld: 90 mg/dL (ref 70–99)
Potassium: 4.2 mEq/L (ref 3.5–5.3)
Sodium: 139 mEq/L (ref 135–145)
Total Bilirubin: 0.7 mg/dL (ref 0.3–1.2)
Total Protein: 7 g/dL (ref 6.0–8.3)

## 2011-03-03 LAB — LIPID PANEL
Cholesterol: 156 mg/dL (ref 0–200)
HDL: 49 mg/dL (ref >39)
LDL Cholesterol: 75 mg/dL (ref 0–99)
Total CHOL/HDL Ratio: 3.2 Ratio
Triglycerides: 158 mg/dL — ABNORMAL HIGH (ref <150)
VLDL: 32 mg/dL (ref 0–40)

## 2011-03-04 ENCOUNTER — Other Ambulatory Visit: Payer: Self-pay | Admitting: Internal Medicine

## 2011-03-04 ENCOUNTER — Other Ambulatory Visit: Payer: Self-pay | Admitting: Cardiology

## 2011-03-05 ENCOUNTER — Encounter: Payer: Self-pay | Admitting: Family Medicine

## 2011-03-05 NOTE — Medication Information (Signed)
Summary: rov/ewj  Anticoagulant Therapy  Managed by: Bethena Midget, RN, BSN Referring MD: Willa Rough, MD PCP: [facility] Supervising MD: Tenny Craw MD, Gunnar Fusi Indication 1: Acute MI Indication 2: DVT Lab Used: [facility] [place] Site: [address] INR POC 2.4 INR RANGE 2-3  Dietary changes: no    Health status changes: no    Bleeding/hemorrhagic complications: no    Recent/future hospitalizations: no    Any changes in medication regimen? no    Recent/future dental: no  Any missed doses?: no       Is patient compliant with meds? yes       Allergies: No Known Drug Allergies  Anticoagulation Management History:      The patient is taking warfarin and comes in today for a routine follow up visit.  Positive risk factors for bleeding include an age of 68 years or older.  The bleeding index is 'intermediate risk'.  Positive CHADS2 values include History of HTN.  Negative CHADS2 values include Age > 62 years old.  His last INR was 1.2.  Anticoagulation responsible provider: Tenny Craw MD, Gunnar Fusi.  INR POC: 2.4.  Cuvette Lot#: [id number].  Exp: 02/2012.    Anticoagulation Management Assessment/Plan:      The patient's current anticoagulation dose is Warfarin sodium 5 mg tabs: Use as directed by Anticoagulation Clinic.  The next INR is due 03/23/2011.  Anticoagulation instructions were given to patient.  Results were reviewed/authorized by Bethena Midget, RN, BSN.  He was notified by Bethena Midget, RN, BSN.         Prior Anticoagulation Instructions: INR 2.3  Continue your current dose of 1 tablet everyday except for Thursdays when you take 1 and 1/2 tablets.  Recheck INR in 4 weeks.    Current Anticoagulation Instructions: INR 2.4 Continue 1 pill everyday except 1.5 pill on Thursdays. Recheck in 4 weeks.

## 2011-03-05 NOTE — Progress Notes (Signed)
Reviewed.  Donovan ask patient to keep a log of blood pressures as OP (as he claims he has been in good control in the past) and RTC in 3 months to discuss changing his medications.

## 2011-03-05 NOTE — Telephone Encounter (Signed)
[address] °

## 2011-03-10 NOTE — Progress Notes (Signed)
Patient notified . He has an already scheduled appointment for a month from last visit . Advised him to keep that appointment.

## 2011-03-16 ENCOUNTER — Telehealth: Payer: Self-pay | Admitting: Cardiology

## 2011-03-16 DIAGNOSIS — I10 Essential (primary) hypertension: Secondary | ICD-10-CM

## 2011-03-16 MED ORDER — FUROSEMIDE 20 MG PO TABS: 20.0000 mg | ORAL_TABLET | Freq: Every day | ORAL | Status: DC

## 2011-03-16 MED ORDER — METOPROLOL SUCCINATE ER 50 MG PO TB24: 50.0000 mg | ORAL_TABLET | Freq: Every day | ORAL | Status: DC

## 2011-03-16 MED ORDER — LISINOPRIL 10 MG PO TABS: 10.0000 mg | ORAL_TABLET | Freq: Every day | ORAL | Status: DC

## 2011-03-16 NOTE — Telephone Encounter (Signed)
Spoke w/pt he states his refills for his lisinopril, metoprolol and lasix were denied and he is not sure why, advised I was not sure, sorry for inconvenience and sent in refills

## 2011-03-16 NOTE — Telephone Encounter (Signed)
Pt calling because his medication was denied and he is not sure why. Please call pt.

## 2011-03-23 ENCOUNTER — Ambulatory Visit (INDEPENDENT_AMBULATORY_CARE_PROVIDER_SITE_OTHER): Payer: Medicare Other | Admitting: *Deleted

## 2011-03-23 DIAGNOSIS — I82409 Acute embolism and thrombosis of unspecified deep veins of unspecified lower extremity: Secondary | ICD-10-CM

## 2011-03-23 LAB — POCT INR: INR: 3

## 2011-03-31 ENCOUNTER — Ambulatory Visit (INDEPENDENT_AMBULATORY_CARE_PROVIDER_SITE_OTHER): Payer: Medicare Other | Admitting: Family Medicine

## 2011-03-31 ENCOUNTER — Encounter: Payer: Self-pay | Admitting: Family Medicine

## 2011-03-31 VITALS — BP 154/96 | HR 70 | Temp 98.2°F | Ht 70.0 in | Wt 168.2 lb

## 2011-03-31 DIAGNOSIS — I10 Essential (primary) hypertension: Secondary | ICD-10-CM

## 2011-03-31 NOTE — Patient Instructions (Signed)
It is good to see you today! I am still puzzled by your blood pressure but, with the readings you are getting at home, I am a little afraid of you going too low if we go up on your medicine.  What I would like you to do is to check your blood pressure a few times when you are out and around (go into the drug store or something like that).  I also would like you to schedule an appointment to see me in about 6 weeks.  Bring your blood pressure cuff with you to that appointment so we can compare readings.

## 2011-04-08 ENCOUNTER — Other Ambulatory Visit: Payer: Self-pay | Admitting: Cardiology

## 2011-04-10 ENCOUNTER — Encounter: Payer: Self-pay | Admitting: Family Medicine

## 2011-04-10 NOTE — Progress Notes (Signed)
Subjective:  The patient presents today for follow-up for his hypertension. He reports that he is compliant with his medications, and it does take them daily. He reports rarely having any missed doses. He also reports that, at home, when he checks his blood pressure he is averaging systolic in the 120s and diastolic in the 60s to 70s. He denies any problems with dizziness, headaches, visual changes, chest pain, shortness of breath, or other worrisome symptoms. He is quite puzzled by his blood pressure reading today.  Objective:  Filed Vitals:   03/31/11 1439  BP: 154/96  Pulse: 70  Temp: 98.2 F (36.8 C)   Gen: NAD, alert, cooperative with exam CV: RRR, no MRG Resp: CTABL, no wheezes noted Abd: SNTND, BS present, no guarding or organomegaly Ext: No edema noted, full ROM

## 2011-04-10 NOTE — Assessment & Plan Note (Signed)
I am quite puzzled by this patient's persistent hypertension while in the office. I am not certain if this is an issue related to his home blood pressure monitor or if it is related to a physician's visit. Nonetheless, I am hesitant to increase any of his antihypertensives for fear of making him run too low when at home, assuming that his home readings are correct. For now, we Gentle keep his regimen the same, instruct him to check his blood pressure while out and about several times before his next visit, and bring his blood pressure monitor with him to the clinic. If his blood pressure cuff is in agreement with our readings at an office visit, I Breon not be terribly inclined to increase his medications.

## 2011-04-13 ENCOUNTER — Encounter: Payer: Self-pay | Admitting: Home Health Services

## 2011-04-14 ENCOUNTER — Other Ambulatory Visit: Payer: Self-pay | Admitting: Cardiology

## 2011-04-20 ENCOUNTER — Ambulatory Visit (INDEPENDENT_AMBULATORY_CARE_PROVIDER_SITE_OTHER): Payer: Medicare Other | Admitting: *Deleted

## 2011-04-20 DIAGNOSIS — I82409 Acute embolism and thrombosis of unspecified deep veins of unspecified lower extremity: Secondary | ICD-10-CM

## 2011-04-20 LAB — POCT INR: INR: 2.8

## 2011-04-20 NOTE — Patient Instructions (Signed)
Continue taking 1 tablet (5 mg) daily EXCEPT for 1 1/2 tablets (7.5 mg) on Thursdays only. Recheck in 4 weeks.

## 2011-04-21 NOTE — Assessment & Plan Note (Signed)
[facility]                            CARDIOLOGY OFFICE NOTE   KALIEL, BOLDS                         MRN:          [id number]  DATE:03/05/2008                            DOB:          November 30, 1941    Mr. Craig Moore has coronary disease.  He has coronary disease.  He had a MI  in May 2006. He has done well.  He tried Chantix and could not stop  smoking but he has cut back.  He says he drinks only a rare beer or  glass of wine.   He is not having any significant chest pain.   PAST MEDICAL HISTORY:   ALLERGIES:  No known drug allergies.   MEDICATIONS:  1. Zetia 10.  2. Furosemide 20.  3. Aspirin 81.  4. Lisinopril 10.  5. Metoprolol 25 b.i.d.   OTHER MEDICAL PROBLEMS:  See the list below.   REVIEW OF SYSTEMS:  He is actually feeling well. He says that his blood  pressure was normal at home today by his check.  He is delayed by my  being late in to see him and I suspect this has caused his blood  pressure to go up. He Derek be checking it at home. Review of systems is  negative.   PHYSICAL EXAM:  Blood pressure is 180/90 with a pulse of 65.  The patient is oriented to person, time and place.  Affect is normal.  HEENT:  Reveals no xanthelasma.  He has poor dentition.  LUNGS:  Reveal distant lung sounds.  CARDIAC:  Reveals an S1 with an S2.  There are no clicks or significant  murmurs.  ABDOMEN:  Soft.  He has no masses or bruits.   PROBLEM:  1. Non-ST elevation MI in May 2006 treated with a bare metal stent.  2. Mild left ventricular dysfunction.  3. History of tobacco use that continues and he is trying to limit.  4. History of alcohol use that he is limiting.  5. Lipid abnormalities.  He is on Zetia.  It would be optimal for him      to be a statin.  6. Hypertension.  He says that his pressure is under control at home      and I have chosen not to push his medicines further.   I Mervin see him back for a cardiology follow-up.  He is  stable.  EKG  shows inferior infarct.     Craig Abed, MD, [facility]  Electronically Signed    JDK/MedQ  DD: 03/05/2008  DT: 03/06/2008  Job #: [id number]   cc:   Melvern Banker

## 2011-04-21 NOTE — Assessment & Plan Note (Signed)
[facility]                            CARDIOLOGY OFFICE NOTE   ZION, TA                         MRN:          [id number]  DATE:08/17/2007                            DOB:          March 05, 1941    Mr. Schar is here for cardiology followup and he is doing well.  He is  not having any chest pain.  He is trying to take his medicines.  He has  not had shortness of breath, syncope, or presyncope.  He has begun  smoking again and he wants to try to stop.  He has not been able to get  nicotine patches.   PAST MEDICAL HISTORY:   ALLERGIES:  No known drug allergies.   MEDICATIONS:  1. Zetia 10.  2. Furosemide 20.  3. Metoprolol 50.  4. Lisinopril 10.  5. Aspirin.   OTHER MEDICAL PROBLEMS:  See the list below.   REVIEW OF SYSTEMS:  He is feeling well and his review of systems is  negative.  He does have dental disease and needs a procedure soon.  Otherwise, review of systems is negative.   PHYSICAL EXAMINATION:  VITAL SIGNS:  Weight is 176 pounds which is  decreased from the prior visit.  Blood pressure is 140/88 with a pulse  of 73.  NEUROLOGIC:  The patient is oriented to person, time, and place.  Affect  is normal.  HEENT:  Reveals no xanthelasma. He has normal extraocular motion.  He  does have poor dentition.  NECK:  There are no carotid bruits.  There is no jugular venous  distention.  LUNGS:  Reveal overall decreased breath sounds.  There are no rales  heard.  There is no respiratory distress.  CARDIAC:  Reveals an S1 with an S2.  There are no clicks or significant  murmurs.  ABDOMEN:  Soft.  There are no masses or bruits.  He has normal bowel  sounds.  EXTREMITIES:  There is no significant peripheral edema.   EKG reveals no significant change.   PROBLEMS:  1. Non-ST elevation myocardial infarction in May 2006, treated with a      bare metal stent.  He was on Plavix for a period of time and has      now been off it and he  remains stable.  2. Mild left ventricular dysfunction and an ejection fraction of 50-      55%.  3. History of tobacco and alcohol use.  He has stopped alcohol.  He      did begin smoking again somewhat.  We Selassie try to see any meds that      we can help him with.  I do not know if Medicaid Artem cover      Chantix.  4. Lipid abnormalities.  He is on Zetia.  5. Hypertension, treated.   It Bartolo be optimal for the patient to be on a Statin.     Luis Abed, MD, [facility]  Electronically Signed    JDK/MedQ  DD: 08/17/2007  DT: 08/18/2007  Job #: [id number]   cc:  [facility]

## 2011-04-24 NOTE — Cardiovascular Report (Signed)
NAME:  Craig Moore, Craig Moore                ACCOUNT NO.:  [id number]   MEDICAL RECORD NO.:  [id number]          PATIENT TYPE:  INP   LOCATION:  6526                         FACILITY:  [facility]   PHYSICIAN:  Charlies Constable, M.D. [facility] DATE OF BIRTH:  1941/09/09   DATE OF PROCEDURE:  04/20/2005  DATE OF DISCHARGE:                              CARDIAC CATHETERIZATION   CLINICAL HISTORY:  Mr. Rogala is 70 years old and has no prior history of  known heart disease.  He is admitted with chest pain and positive enzymes  consistent with a non-ST elevation myocardial infarction.  He was studied by  Dr. Dorethea Clan and found to have moderately tight lesions in the proximal LAD  and in the mid right coronary artery.  He has had a problem with alcohol and  we were concerned about compliance with long-term Plavix, so we elected to  perform percutaneous coronary intervention with bare metal stents.   PROCEDURE:  The procedure was performed via the right femoral artery using  an arterial sheath.  We used a 6-French Q-4 guiding catheter with side-holes  for the LAD lesion.  The patient was on an Integrilin drip and was given  additional heparin to prolong the ACT to greater than 200 seconds.  He was  given 600 mg of Plavix before the procedure.  We crossed the lesion in the  proximal LAD with a prowater wire without difficulty.  We direct stented  with a 3.5 x 20 mm Liberty stent, deploying this with one inflation of 12  atmospheres for 30 seconds.  We post-dilated with a 3.75 x 15 mm Quantum  Maverick, performing two inflations up to 16 atmospheres for about 30  seconds.   We then approached the right coronary artery.  We used a JR-4 6-French  guiding catheter with side-holes.  We crossed the lesion with a prowater  wire without difficulty.  We direct stented with a 3.0 x 24 mm Liberty  stent, pulling this with one inflation of 15 atmospheres for about 30  seconds.  We post-dilated with a 3.5 x 20 mm Quantum  Maverick, performing  two inflations up 16 atmospheres for about 30 seconds.  Repeat diagnostic  study was then performed through the guiding catheter.   The right femoral artery was closed with Angioseal at the end of the  procedure.  The patient tolerated the procedure well and left the laboratory  in satisfactory condition.   RESULTS:  Initially, the stenosis in the proximal LAD was estimated at 80%.  Following stenting, this improved to 0%.  There was a small diagonal branch  that had an ostial lesion, but this remained patent following stent  deployment.   The stenosis in the mid right coronary artery was initially estimated at  80%.  Following stenting, this improved to 0%.   CONCLUSION:  1.  Successful stenting of the lesion in the proximal LAD using a Liberty      bare metal stent with improvement in sentinel narrowing from 80% to 0%.  2.  Successful stenting of the lesion in the mid right coronary artery  using      a Liberty bare metal stent with improvement in sentinel narrowing from      80% to 0%.   DISPOSITION:  Patient returned to his room to have further observation.  We  Zaeden recommend Plavix for one year, but if the patient should be  noncompliant we feel that the risk of stent thrombosis Qualyn be less with  bare metal stents than with drug-eluting stents.      BB/MEDQ  D:  04/20/2005  T:  04/20/2005  Job:  [id number]

## 2011-04-24 NOTE — H&P (Signed)
NAME:  Craig Moore, Craig Moore                ACCOUNT NO.:  [id number]   MEDICAL RECORD NO.:  [id number]          PATIENT TYPE:  EMS   LOCATION:  ED                           FACILITY:  [facility]   PHYSICIAN:  Gertha Calkin, M.D.DATE OF BIRTH:  1941/02/28   DATE OF ADMISSION:  04/19/2005  DATE OF DISCHARGE:                                HISTORY & PHYSICAL   PRIMARY CARE PHYSICIAN:  Unassigned.   This is a 70 year old African-American male with limited past medical care,  who presents with acute onset of shortness of breath approximately 12:30  p.m. earlier today.  The patient states he was seated in his car when he had  the onset of his symptoms.  After the initial onset of his shortness of  breath, this was followed by chest tightness, some light amount of  diaphoresis but no nausea, no vomiting.  The chest tightness he cannot even  quantify, as his biggest worry was his difficulty with his breathing.  He  states that he has had similar symptoms like this last week with the  shortness of breath, but those lasted minutes in duration and spontaneously  resolved.  He denies any peripheral edema or orthopnea, denies problems with  chest pain or dyspnea on exertion at baseline.  His shortness of breath was  not relieved until he was brought to the ED and medications were  administered (Lasix and nitroglycerin drip).   PAST MEDICAL HISTORY:  None.   MEDICATIONS:  None.   ALLERGIES:  None.   FAMILY HISTORY:  Positive for brain aneurysm in his father who passed away  at the age of 35.  No known family history of coronary artery disease, early  onset CVD, or hypercholesterolemia, lung or kidney issues.  No hypertension  or diabetes that runs in the family that he is aware of.   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  He is separated, has 9 kids total.  He has 4 brothers and 3  living sisters.  One sister died from cancer, unknown type.  Another sister  has breast cancer.  He smokes approximately 1/2 pack of cigarettes a day and  states that he has been smoking for approximately 30-35 years.  He also  drinks occasionally, 2-3 beers approximately 3-4 times a week, denies any  binge drinking or black-outs.  Has not had any legal consequences from his  drinking.  Denies cocaine, marijuana, IV drug abuse.   REVIEW OF SYSTEMS:  No headaches, blurred vision, no chest pain, no problems  with dyspnea on exertion.  Denies cough or sinus problems.  Has no problems  with abdominal pain, peripheral edema, orthopnea.  No PND.  No weight  changes.  Denies any dysuria, dysphagia, constipation, diarrhea, nausea or  vomiting.  The rest of review of systems is negative.   PHYSICAL EXAMINATION:  VITAL SIGNS:  Temperature 97.5, blood pressure 165/97  down from 237/154 after medications administered.  Heart rate of 133,  respirations 24, 99% on 2 L nasal cannula.  GENERAL:  This is a well-developed, well-nourished  African-American male in  mild distress sitting in bed on face mask.  HEENT:  Pupils equal, round, and reactive to light.  Extraocular movements  are intact.  Posterior pharynx is clear without erythema or exudate.  NECK:  Supple.  Difficult to assess JVP, but no hepatojugular reflux, has no  lymphadenopathy, no masses, no bruits.  CARDIOVASCULAR:  Tachycardic but regular rhythm.  No audible murmurs, rubs,  or gallops.  CHEST:  Rales approximately 2/3 up bilateral lung fields.  No significant  crackles.  No wheezing present.  Air movement is adequate without use of  accessory muscles.  ABDOMEN:  Distended, however, soft, nontender with good bowel sounds.  No  hepatosplenomegaly appreciated.  No flank tenderness. RECTAL EXAM: nl  tone/prostate. Small external hemorrhoid. HEME NEGATIVE.  EXTREMITIES:  Without cyanosis, clubbing, or edema.  Pulses are 2+ and  symmetric upper and lower.  NEUROLOGIC:  Alert and oriented x 3.  Cranial nerves 2-12 are  intact.  There  is no focal strength or sensation deficits.   LABORATORY DATA:  Chest x-ray shows evidence of cardiac enlargement with  significant moderate amount of vascular congestion.   BNP of 1990.   White count of 6.7, hemoglobin 12.2, platelets are 223, MCV of 104, RDW is  within normal.  Sodium is 135, potassium 4.3, chloride 102, bicarb 22,  glucose of 211, BUN 6, creatinine 1.2, calcium 9.3.  AST elevated slightly  at 46, ALT of 25, total bili 1.3, alk phos elevated at 138, total protein  8.1, albumin 4.0.  Point of care markers x 1 is negative.  EKG shows sinus  tach with LVH and left axis deviation, otherwise normal intervals.  A repeat  EKG without changes from first.   ASSESSMENT/PLAN:  1.  New onset congestive heart failure.  2.  Hypertensive urgency.  3.  Sinus tachycardia.  4.  Tobacco abuse.  5.  Abnormal LFTs.  6.  Macrocytic macrocytosis.  7.  Deep venous thrombosis gastrointestinal prophylaxis.   Craig Moore admit to telemetry bed, IV diuretics and follow I's & O's and daily  weights.  We Seven rule him out for acute myocardial injury with serial  enzymes and EKGs.  Check fasting lipid profile, TSH.  For his sinus  tachycardia and severe elevated blood pressure on admission, we Craig Moore check a  UDS.  During this hospitalization, we Craig Moore also evaluate his left  ventricular function with a 2 D echocardiogram.  We Craig Moore check serum alcohol  and hepatitis panel and orthostatics again for his LFTs and his sinus  tachycardia, respectively.  We Craig Moore place a nicotine patch and provide him  tobacco cessation counseling prior to discharge.      JD/MEDQ  D:  04/19/2005  T:  04/19/2005  Job:  [id number]

## 2011-04-24 NOTE — Discharge Summary (Signed)
NAME:  Craig Moore, Craig Moore                ACCOUNT NO.:  [id number]   MEDICAL RECORD NO.:  [id number]          PATIENT TYPE:  INP   LOCATION:  6526                         FACILITY:  [facility]   PHYSICIAN:  Vida Roller, M.D.   DATE OF BIRTH:  [birth year]   DATE OF ADMISSION:  04/20/2005  DATE OF DISCHARGE:  04/21/2005                           DISCHARGE SUMMARY - REFERRING   SUMMARY OF HISTORY:  Mr. Schinke is a 70 year old African-American male who  presented to [facility] on Apr 19, 2005 with acute onset of shortness of  breath followed by chest tightness, diaphoresis, and it was noted that he  was a poor historian.   He was not on any medications prior to his presentation. He probably has a  history of untreated hypertension, tobacco and alcohol use, remote cocaine.   LABORATORY:  At [facility] on the fourteenth chest x-ray showed  cardiomegaly and moderate CHF. Admission weight at [facility] was 185.5. Admission H&H was 17.2 and 51.0, MCV was slightly  elevated 103.5, platelets 223, WBC and 6.7. At the time of discharge H&H was  14.9, 43.8, MCV 102.2, platelets 195, WBCs 6.0. Admission PT 13.9, sodium  135, potassium 43, BUN 6, creatinine 1.2, glucose 211. LFTs were elevated at  alkaline phosphatase of 138, AST of 46. At the time of discharge sodium was  138, potassium 35, BUN 13, creatinine 1.8. Admission BNP was 1990. CK total  MBs were negative for myocardial infarction. However, troponins were  elevated at 0.14, 0.16 and 0.11. Fasting lipids showed total cholesterol  181, triglycerides 70, HDL 52, LDL 115. TSH was 2.142. Vitamin B12 was  elevated at 1211 with a folate of 15.8. Alcohol and drug screen was negative  at the time of admission. Hemoglobin A1C on the 15th was 6.2. EKGs on  admission showed sinus tachycardia with a rate of 147 and lateral ST-segment  depression. Subsequent EKGs showed normal sinus rhythm with left axis  deviation, LVH, antral  lateral inferior T-wave inversion.   HOSPITAL COURSE:  Mr. Denardo was admitted to [facility] by [facility] and we were asked to consult. Dr. Rise Mu in association with Dr. Myrtis Ser  saw the patient on Apr 19, 2005 and felt that he had a non-Q-wave myocardial  infarction associated with congestive heart failure and hypertensive  emergency. A 2-D echocardiogram was performed and EF was 25-35% with diffuse  LV hypokinesis, moderate LVH, 1+ TR, normal RV function, left atrial  dilatation. The patient was diuresed over several days with improvement in  his blood pressure. Cardiac catheterization was performed on Apr 20, 2005 by  Dr. Dorethea Clan. Please refer to dictated note. It is noted that his EF was 25-  30%. He did have a mid 90% LAD lesion and 75% RCA lesion. Dr. Dorethea Clan  recommended stenting the LAD but would favor be non drug-eluting stent  secondary to the patient's noncompliance. Dr. Juanda Chance performed stenting to  the LAD and RCA reducing both lesions from 80% to 0% with a non drug-eluting  stent. However he did recommend taking Plavix long-term. His  Integrilin was  discontinued secondary to hematoma. It is felt that he could possibly be  discharged in the morning. On Apr 21, 2005, he was ambulating without  difficulty. Catheterization site was intact. Dr. Juanda Chance felt that the  patient could be discharged home.   DISCHARGE DIAGNOSIS:  1.  Non-Q-wave myocardial infarction.  2.  Congestive heart failure.  3.  Ischemic cardiomyopathy.  4.  Tobacco and alcohol use, remote drug use.  5.  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  6.  Hyperglycemia with an elevated hemoglobin A1c.  7.  Hyperlipidemia.  8.  Elevated liver function tests secondary to congestive heart failure,      history as previously.   DISPOSITION:  The patient is discharged home.   DISCHARGE MEDICATIONS:  1.  Plavix 75 milligrams q.d. for 1 year.  2.  Aspirin 325  q.d.  3.  Enalapril 5 milligrams b.i.d.  4.  Metoprolol 50 milligrams half tablet b.i.d.  5.  Mevacor 40 milligrams q.h.s.  6.  Lasix 20 milligrams q.d.  7.  Nitroglycerin 0.4 as needed.   He was given discharge instructions in regards to his catheterization site.  Maintain low salt, fat, cholesterol diet. No smoking or tobacco products. No  alcohol. He Yao follow up with Dr. Myrtis Ser in the office on June 8 at 2:30  p.m. At the time of follow-up arrangements Nainoa need to be made for fasting  lipids and LFTs since Mevacor was initiated. If he is not found a primary  care physician at the time of follow-up, this was also need to be arranged  in that the patient may be a diabetic and this needs to be further  investigated. Just prior to discharge, he Tristyn see the smoking cessation  consult as well as cardiac rehab and case management Franke be involved to  assist with any discharge needs.      EW/MEDQ  D:  04/21/2005  T:  04/21/2005  Job:  [id number]   cc:   Willa Rough, M.D.

## 2011-04-24 NOTE — Consult Note (Signed)
NAME:  Craig Moore, Craig Moore                ACCOUNT NO.:  [id number]   MEDICAL RECORD NO.:  [id number]          PATIENT TYPE:  INP   LOCATION:  0160                         FACILITY:  [facility]   PHYSICIAN:  Verne Grain, MD   DATE OF BIRTH:  18-Jan-1941   DATE OF CONSULTATION:  04/19/2005  DATE OF DISCHARGE:                                   CONSULTATION   Consult from Dr. Sedonia Small of the [facility] group.   REASON FOR CONSULTATION:  Non-ST elevation myocardial  infarction/hypertensive emergency.   HISTORY OF PRESENT ILLNESS:  A 70 year old male with a history of moderate  alcohol and tobacco use, history of high blood, was not taking any  medications for any known disease processes, no other known past medical or  cardiac history.  The patient reportedly works as a Scientist, physiological in [employer]  and has noted over the past one to two months transient episodes of  shortness of breath related to exertion.  He states these episodes would  usually go away in a few minutes on their own; however, yesterday the  patient had an episode that lasted longer than usual.  Today the patient had  onset of shortness of breath with some chest tightness that did not go away,  prompting him to report to the emergency room for further evaluation.  In  the [facility] emergency room, the patient's blood pressure was reportedly  237/154.  With medications, this value improved to 165/97 with nitroglycerin  and IV Lopressor.  The patient's EKG revealed anterolateral T-wave inversion  as well as evidence of LVH and left axis deviation.  The patient's chest x-  ray revealed vascular congestion and cardiomegaly.  The patient reports that  he felt back to his usual baseline within an hour of treatment with the  above medications and O2 by nasal cannula in the [facility] emergency room.  He subsequently had three sets of point of care cardiac markers in the  emergency room that were negative, was subsequently  admitted for further  blood pressure control and diuresis in regard to the findings of his  cardiomegaly, vascular congestion, as well as laboratory value with a BNP of  1990 and complaints of shortness of breath.  His first set of laboratory  values, however, after admission revealed emergence of a low positive CK-MB  and troponin (CK-MB equals 4.8 and troponin equals 0.14).  He subsequently  was transferred to the intensive care unit for closer monitoring.  Repeat  EKG revealed presence of anterior precordial T-wave inversions and was  otherwise not significantly changed. His diastolic blood pressure upon  arrival in the ICU was 118.  Review of his laboratory values is also notable  for a blood glucose of 211.  The patient specifically denies any history of  drug use.  Denies cocaine use for at least the past several years.   PAST MEDICAL HISTORY:  1.  Ongoing tobacco, one pack per day x35 years.  2.  Moderate alcohol use, two to three beers three to four times per week.  3.  Probable untreated  hypertension.   ALLERGIES/ADVERSE REACTIONS:  None known.  The patient specifically denies  any history to shellfish or iodine.   CURRENT MEDICATIONS:  1.  Folate and thiamine.  2.  Lasix 60 mg IV q.d.  3.  Heparin 14.5 mL/hr.  4.  Lopressor 25 mg p.o. b.i.d.  5.  Nicotine patch.  6.  Protonix 40 mg p.o. daily.  7.  Nitroglycerin drip.   SOCIAL HISTORY:  The patient lives in [place], is a cab driver.  He is  separate with nine children and a positive alcohol and tobacco history but  denies any recent drug use for at least the past several years.   FAMILY HISTORY:  The patient has a father who died of a brain aneurysm at  age 59, a sister who died of cancer.  The patient has four brothers and  three sisters who are alive without known health problems.   REVIEW OF SYSTEMS:  Essentially negative.  No fevers, chills.  No recent  weight gain or weight loss.  No acute rashes reported.  No  neurologic  complaints.  No bowel or bladder complaints.  The patient did have some  chest tightness with shortness of breath as described in the HPI.  No skin  or hair changes suggestive of endocrinologic abnormality.  All other systems  are negative.   PHYSICAL EXAMINATION:  VITAL SIGNS:  Blood pressure 185/118, temperature 37  degrees, respiratory rate 18, heart rate 90.  GENERAL:  The patient is pleasant, cooperative, in no apparent distress.  HEENT:  He is normocephalic, atraumatic.  His extraocular eye movements are  intact.  NECK:  Supple with jugular venous pressures estimated at 8 cmH2O.  CHEST:  Lung fields reveal bibasilar crackles.  CARDIOVASCULAR:  Regular S1 and a regular S2.  ABDOMEN:  Soft, nontender, nondistended, with positive bowel sounds.  EXTREMITIES:  Lower extremity examination reveals no evidence of edema.  SKIN:  There is no acute rash on limited skin examination.  NEUROLOGIC:  There are grossly no focal neurologic deficits on brief  neurologic exam.  The patient was able to move all extremities, although  gait was not tested.   LABORATORY VALUES:  BNP 1990.  White blood cell count 6.7, hematocrit 51,  platelet count 223.  Sodium 135, potassium 43, chloride 12, bicarb 22, BUN  6, creatinine 1.2, glucose 211, anion gap 11.  Alcohol less than 1.  Calcium  9.3, AST 46, ALT 25, total bilirubin 1.3, alkaline phosphatase 139, albumin  4.0, total protein 8.1.  Myoglobin 107, 159, 402.  Troponin less than 0.05  x3, 0.14.  CK-MB 2.5, 2.0, 2.9, 4.8.  Total CK 203.   EKG:  Evidence of left ventricular hypertrophy with left axis deviation,  associated repolarization abnormalities, left atrial enlargement, anterior  precordial T-wave inversions described in HPI.  Chest x-ray:  Cardiac  enlargement with vascular congestion per written report.   ASSESSMENT AND RECOMMENDATIONS:  A 70 year old with history of alcohol, tobacco and probable untreated hypertension, reports with  a history of what  sound like crescendo angina and low positive cardiac markers consistent with  non-ST elevation myocardial infarction, evidence of volume overload  consistent with congestive heart failure (hypertensive emergency versus  acute coronary syndrome).  Jermon add eptifibatide to current use of  unfractionated heparin.  Kazden change the Lopressor to labetalol drip for  optimization of blood pressure (goal blood pressure 130-180/70-95) and add  Captopril 6.25 mg p.o. q.8h. and titrate up, start Lipitor 80  mg p.o.  q.h.s., aspirin 162 mg p.o. daily, and continue to cycle CK-MB values until  a downward trend is established, continuing IV Lasix for diuresis (goal  negative 1-2 L overnight) is certainly reasonable.  Pedro have the patient  n.p.o. after midnight in  anticipation of cardiac catheterization in the morning (may consider right  and left heart catheterization).  Liam also write for the patient to have a  lipid profile, LFTs, thyroid profile, a hemoglobin A1C, along with urine  drug screen checked with morning labs.  Continue to encourage tobacco  cessation throughout hospitalization.      DDH/MEDQ  D:  04/19/2005  T:  04/20/2005  Job:  [id number]

## 2011-05-12 ENCOUNTER — Encounter: Payer: Self-pay | Admitting: Family Medicine

## 2011-05-12 ENCOUNTER — Ambulatory Visit (INDEPENDENT_AMBULATORY_CARE_PROVIDER_SITE_OTHER): Payer: Medicare Other | Admitting: Family Medicine

## 2011-05-12 DIAGNOSIS — I10 Essential (primary) hypertension: Secondary | ICD-10-CM

## 2011-05-12 DIAGNOSIS — I82409 Acute embolism and thrombosis of unspecified deep veins of unspecified lower extremity: Secondary | ICD-10-CM

## 2011-05-12 DIAGNOSIS — E785 Hyperlipidemia, unspecified: Secondary | ICD-10-CM

## 2011-05-12 NOTE — Patient Instructions (Addendum)
It was great to see you today! Because of the history of a blood clot, you need to work hard on stopping smoking. I think it would be OK for you to come off your coumadin at the end of the month.  I am giving you a handout about DVTs.  If you experience any of the symptoms, get in to the hospital immediately.  If you have another clot, you Hagen have to be on coumadin for the rest of your life. Once you stop the coumadin, you can start taking 81mg  of aspirin again. Come in a couple days before your next appointment to get some fasting labs drawn.

## 2011-05-12 NOTE — Assessment & Plan Note (Signed)
Pt counceled extensively on the risks and benefits of continued coumadin therapy.  As he is a smoker with hypertension, CAD, and dislipidemia, he is at greater than average risk of repeat DVT.  After significant discussion, the patient decided that he would like to discontinue therapy.  We Manoj plan to do so at the end of the month once he has been anticoagulated for a total of 6 months.  Pt advised of red flags to seek medical attention, and informed that a repeat clot would necessitate lifelong anticoagulation.

## 2011-05-12 NOTE — Progress Notes (Signed)
Subjective: Pt presents for bp followup and to discuss discontinuing his coumadin.  He reports no problems with chest pain, shortness of breath, headaches, or visual changes.  His blood pressure continues to run 120s/70s on his home machine.  No low blood pressures or problems with orthostasis.  Pt continues to take coumadin for DVT.  Has had therepeutic INR for close to six months at this point and would like to come off of medicine if at all possible.  Objective:  Filed Vitals:   05/12/11 1331  BP: 130/78  Pulse: 72  Temp: 98.7 F (37.1 C)   Gen: NAD, alert, cooperative with exam CV: RRR, no MRG Resp: CTABL, no wheezes noted, mildly decreased bilaterally Abd: SNTND, BS present, no guarding or organomegaly Ext: No edema noted, no palpable chords or knots.  No pain on palpation.

## 2011-05-18 ENCOUNTER — Ambulatory Visit (INDEPENDENT_AMBULATORY_CARE_PROVIDER_SITE_OTHER): Payer: Medicare Other | Admitting: *Deleted

## 2011-05-18 DIAGNOSIS — I82409 Acute embolism and thrombosis of unspecified deep veins of unspecified lower extremity: Secondary | ICD-10-CM

## 2011-05-18 LAB — POCT INR: INR: 2.1

## 2011-05-18 NOTE — Assessment & Plan Note (Signed)
Pt blood pressure is at goal today.  When compared with his home bp monitor, clinic blood pressure reads lower.  If that is the case and he is getting 120s systolic at home, I do not feel we need to increase his medications.  Kellyn plan followup in about 3 months.

## 2012-03-19 ENCOUNTER — Other Ambulatory Visit: Payer: Self-pay | Admitting: Cardiology

## 2012-04-22 ENCOUNTER — Other Ambulatory Visit: Payer: Self-pay | Admitting: Cardiology

## 2012-05-03 ENCOUNTER — Telehealth: Payer: Self-pay | Admitting: Cardiology

## 2012-05-03 MED ORDER — EZETIMIBE 10 MG PO TABS: 10.0000 mg | ORAL_TABLET | Freq: Every day | ORAL | Status: DC

## 2012-05-03 NOTE — Telephone Encounter (Signed)
Pt calling re denial on zetia, wants to know why?

## 2012-05-03 NOTE — Telephone Encounter (Signed)
Appt scheduled, zetia refilled for 30 days, pt notified he needs an appt for further refills.

## 2012-05-10 ENCOUNTER — Encounter: Payer: Self-pay | Admitting: Cardiology

## 2012-05-10 DIAGNOSIS — I82409 Acute embolism and thrombosis of unspecified deep veins of unspecified lower extremity: Secondary | ICD-10-CM | POA: Insufficient documentation

## 2012-05-10 DIAGNOSIS — R55 Syncope and collapse: Secondary | ICD-10-CM | POA: Insufficient documentation

## 2012-05-10 DIAGNOSIS — I2510 Atherosclerotic heart disease of native coronary artery without angina pectoris: Secondary | ICD-10-CM | POA: Insufficient documentation

## 2012-05-10 DIAGNOSIS — R9430 Abnormal result of cardiovascular function study, unspecified: Secondary | ICD-10-CM | POA: Insufficient documentation

## 2012-05-11 ENCOUNTER — Ambulatory Visit (INDEPENDENT_AMBULATORY_CARE_PROVIDER_SITE_OTHER): Payer: Medicare Other | Admitting: Cardiology

## 2012-05-11 ENCOUNTER — Encounter: Payer: Self-pay | Admitting: Cardiology

## 2012-05-11 VITALS — BP 138/88 | Ht 71.0 in | Wt 166.0 lb

## 2012-05-11 DIAGNOSIS — R109 Unspecified abdominal pain: Secondary | ICD-10-CM

## 2012-05-11 DIAGNOSIS — I2510 Atherosclerotic heart disease of native coronary artery without angina pectoris: Secondary | ICD-10-CM

## 2012-05-11 DIAGNOSIS — F17200 Nicotine dependence, unspecified, uncomplicated: Secondary | ICD-10-CM

## 2012-05-11 DIAGNOSIS — R55 Syncope and collapse: Secondary | ICD-10-CM

## 2012-05-11 DIAGNOSIS — R1030 Lower abdominal pain, unspecified: Secondary | ICD-10-CM

## 2012-05-11 DIAGNOSIS — I82409 Acute embolism and thrombosis of unspecified deep veins of unspecified lower extremity: Secondary | ICD-10-CM

## 2012-05-11 NOTE — Patient Instructions (Signed)
Your physician wants you to follow-up in: 1 year. You Jahseh receive a reminder letter in the mail two months in advance. If you don't receive a letter, please call our office to schedule the follow-up appointment.  

## 2012-05-11 NOTE — Assessment & Plan Note (Signed)
Coronary disease is stable.  He needs no further workup at this time. 

## 2012-05-11 NOTE — Assessment & Plan Note (Signed)
Patient continues to smoke. I have counseled him to stop. 

## 2012-05-11 NOTE — Assessment & Plan Note (Signed)
There is a history of deep venous thrombosis. Coumadin was then stopped.

## 2012-05-11 NOTE — Progress Notes (Signed)
HPI Patient is seen for followup of coronary disease. I saw him last February, 2012. His cardiac status has remained stable. He received a bare-metal stent for a non-ST elevation MI in 2006. Nuclear scan in January, 2012 revealed no ischemia and there was normal LV function. He's not having any chest pain or shortness of breath. He's doing well. He did have DVT in December, 2011. He's been treated with Coumadin. He was then felt that he had been treated long enough and his Coumadin was stopped.  He has an area that is causing him some discomfort in his right groin. He'll be seeing his primary physician about this tomorrow.  As part of today's evaluation I have reviewed my old cardiology records. I have completely abated the new electronic medical record.  No Known Allergies  Current Outpatient Prescriptions  Medication Sig Dispense Refill  . aspirin EC 81 MG EC tablet Take 1 tablet (81 mg total) by mouth daily.  150 tablet  2  . ezetimibe (ZETIA) 10 MG tablet Take 1 tablet (10 mg total) by mouth daily.  30 tablet  0  . furosemide (LASIX) 20 MG tablet TAKE 1 TABLET BY MOUTH ONCE DAILY  30 tablet  2  . lisinopril (PRINIVIL,ZESTRIL) 10 MG tablet TAKE 1 TABLET BY MOUTH ONCE DAILY  30 tablet  2  . metoprolol succinate (TOPROL-XL) 50 MG 24 hr tablet TAKE 1 TABLET BY MOUTH ONCE DAILY  30 tablet  2    History   Social History  . Marital Status: Legally Separated    Spouse Name: N/A    Number of Children: N/A  . Years of Education: N/A   Occupational History  . Not on file.   Social History Main Topics  . Smoking status: Current Everyday Smoker -- 0.2 packs/day    Types: Cigarettes  . Smokeless tobacco: Not on file  . Alcohol Use: 4.2 oz/week    7 Glasses of wine per week  . Drug Use: No  . Sexually Active: Not Currently   Other Topics Concern  . Not on file   Social History Narrative  . No narrative on file    Family History  Problem Relation Age of Onset  . Cancer Mother     . Heart disease Father   . Diabetes Father   . Stroke Father     Past Medical History  Diagnosis Date  . Hyperlipidemia   . Hypertension   . Myocardial infarction   . CAD (coronary artery disease)     Non-STEMI May, 2006, bare-metal stent  //   nuclear, January, 2012, EF 70%, no ischemia  . Ejection fraction     EF 55%, echo, October, 2006, technically difficult  //  EF 70%, nuclear, January, 2012  . Tobacco abuse   . Pre-syncope     ER, January, 2011, dehydration  . DVT (deep venous thrombosis)     December, 2011, Coumadin therapy    Past Surgical History  Procedure Date  . Coronary angioplasty 2006    ROS Patient denies fever, chills, headache, sweats, rash,  Change in vision, change in hearing, chest pain, cough, nausea vomiting, urinary symptoms. All other systems are reviewed and are negative.  PHYSICAL EXAM Patient is oriented to person time and place. Affect is normal. There is no jugulovenous distention. Lungs are clear. Respiratory effort is nonlabored. Cardiac exam reveals S1 and S2. There no clicks or significant murmurs. The abdomen is soft. There is no peripheral edema.There no musculoskeletal deformities.  There are no skin rashes. Very limited exam of the right groin area reveals no obvious external abnormality.  Filed Vitals:   05/11/12 1508  BP: 138/88  Height: 5\' 11"  (1.803 m)  Weight: 166 lb (75.297 kg)   EKG is done today and reviewed by me. There is sinus rhythm.  ASSESSMENT & PLAN

## 2012-05-11 NOTE — Assessment & Plan Note (Signed)
Etiology the patient's groin pain is not clear. I did only a very limited exam to be sure that the patient had no marked external abnormality. He Craig Moore be seeing his primary physician about this tomorrow.

## 2012-05-11 NOTE — Assessment & Plan Note (Signed)
He has had no syncope or presyncope. No further workup.

## 2012-05-12 ENCOUNTER — Encounter: Payer: Self-pay | Admitting: Family Medicine

## 2012-05-12 ENCOUNTER — Ambulatory Visit (INDEPENDENT_AMBULATORY_CARE_PROVIDER_SITE_OTHER): Payer: Medicare Other | Admitting: Family Medicine

## 2012-05-12 VITALS — BP 132/84 | Temp 98.1°F | Ht 70.0 in | Wt 165.0 lb

## 2012-05-12 DIAGNOSIS — R1030 Lower abdominal pain, unspecified: Secondary | ICD-10-CM

## 2012-05-12 DIAGNOSIS — R229 Localized swelling, mass and lump, unspecified: Secondary | ICD-10-CM | POA: Insufficient documentation

## 2012-05-12 DIAGNOSIS — R109 Unspecified abdominal pain: Secondary | ICD-10-CM

## 2012-05-12 MED ORDER — TRAMADOL HCL 50 MG PO TABS: 50.0000 mg | ORAL_TABLET | Freq: Three times a day (TID) | ORAL | Status: AC | PRN

## 2012-05-12 NOTE — Progress Notes (Signed)
  Subjective:    Patient ID: Craig Moore, male    DOB: 20-Apr-1941, 71 y.o.   MRN: [id number]  HPI  1.  Right groin pain:  71 yo M with Right groin pain for past month.  Noticed first when he was playing golf.  Describes sharp stabbing pain worse when he's been standing, bending with golf, or bending over to put on his shoe.  No trauma/injury to the site.  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  No redness or swelling of leg.  Taking Alleve initially to relieve his pain, but this is not helping as much anymore.  No fevers, chills, shortness of breath.  No dysuria, change in bowel habits.    Review of Systems See HPI above for review of systems.       Objective:   Physical Exam Gen:  Alert, cooperative patient who appears stated age in no acute distress.  Vital signs reviewed. GU:  No inguinal hernias noted.  No mass or tenderness in scrotal sac.  Do note 2-3 cm mass that bulges when patient coughs in femoral canal.  Minimal tenderness here. Ext:  No edema BL LE's.  No palpable cord.  No tenderness along medial or lateral aspect of Right leg distal to mass in femoral canal.   MSK:  No hip tenderness with full external or internal rotation bilaterally.  Patient able to fully extend and flex hip on Right side without any pain or tenderness.     Skin:  No overlying redness of groin, no lesions noted.        Assessment & Plan:

## 2012-05-12 NOTE — Patient Instructions (Signed)
Take the Tramadol for pain relief.    We Craig Moore get you set up with surgery.

## 2012-05-12 NOTE — Assessment & Plan Note (Signed)
Painful mass noted Right femoral canal.   Do not think this is a lymph node. Question femoral hernia, although this is uncommon in males. [place] refer to surgery for possible ultrasound of area to see if this is really hernia and for further recommendations.

## 2012-05-23 ENCOUNTER — Telehealth: Payer: Self-pay | Admitting: Family Medicine

## 2012-05-23 DIAGNOSIS — R1909 Other intra-abdominal and pelvic swelling, mass and lump: Secondary | ICD-10-CM

## 2012-05-23 NOTE — Telephone Encounter (Signed)
Referral placed on 6/6.  Craig Moore route to [facility] to complete

## 2012-05-23 NOTE — Telephone Encounter (Signed)
Patient is calling to check on the status of the referral for surgeon for hernia.  The pain meds aren't working so he really wants to get going on this.

## 2012-05-24 NOTE — Telephone Encounter (Signed)
?  ultrasound needed

## 2012-05-25 ENCOUNTER — Other Ambulatory Visit: Payer: Self-pay | Admitting: Family Medicine

## 2012-05-25 ENCOUNTER — Telehealth: Payer: Self-pay | Admitting: *Deleted

## 2012-05-25 DIAGNOSIS — R1909 Other intra-abdominal and pelvic swelling, mass and lump: Secondary | ICD-10-CM

## 2012-05-25 NOTE — Addendum Note (Signed)
Addended by: Farrell Ours on: 05/25/2012 03:25 PM   Modules accepted: Orders

## 2012-05-25 NOTE — Addendum Note (Signed)
Addended by: Damita Lack on: 05/25/2012 03:49 PM   Modules accepted: Orders

## 2012-05-25 NOTE — Addendum Note (Signed)
Addended by: Farrell Ours on: 05/25/2012 03:27 PM   Modules accepted: Orders

## 2012-05-25 NOTE — Telephone Encounter (Signed)
Spoke with patient and informed him of appointment @ [facility] 6/26@ 9am arrival time. He Dontrae be seeing Dr. Michaell Cowing

## 2012-05-25 NOTE — Addendum Note (Signed)
Addended byGwendolyn Grant, Lendora Keys H on: 05/25/2012 12:21 PM   Modules accepted: Orders

## 2012-05-25 NOTE — Telephone Encounter (Signed)
Spoke with patient and informed him of the [place] appointment made for him which is 6/24 @ 11:30am, patient to arrive @ 11:15am[facility]. patient is aware that this needs to be done first before appointment at [facility] can be made

## 2012-05-28 ENCOUNTER — Other Ambulatory Visit: Payer: Self-pay | Admitting: Cardiology

## 2012-05-30 ENCOUNTER — Ambulatory Visit (HOSPITAL_COMMUNITY)
Admission: RE | Admit: 2012-05-30 | Discharge: 2012-05-30 | Disposition: A | Discharge status: Home / Self Care | Payer: Medicare Other | Source: Ambulatory Visit | Attending: Family Medicine | Admitting: Family Medicine

## 2012-05-30 DIAGNOSIS — R1909 Other intra-abdominal and pelvic swelling, mass and lump: Secondary | ICD-10-CM

## 2012-05-30 DIAGNOSIS — R1031 Right lower quadrant pain: Secondary | ICD-10-CM | POA: Insufficient documentation

## 2012-05-30 DIAGNOSIS — R1900 Intra-abdominal and pelvic swelling, mass and lump, unspecified site: Secondary | ICD-10-CM | POA: Insufficient documentation

## 2012-05-30 DIAGNOSIS — R933 Abnormal findings on diagnostic imaging of other parts of digestive tract: Secondary | ICD-10-CM | POA: Insufficient documentation

## 2012-05-30 DIAGNOSIS — M1610 Unilateral primary osteoarthritis, unspecified hip: Secondary | ICD-10-CM | POA: Insufficient documentation

## 2012-05-30 DIAGNOSIS — M169 Osteoarthritis of hip, unspecified: Secondary | ICD-10-CM | POA: Insufficient documentation

## 2012-06-01 ENCOUNTER — Ambulatory Visit (INDEPENDENT_AMBULATORY_CARE_PROVIDER_SITE_OTHER): Payer: Self-pay | Admitting: Surgery

## 2012-06-01 ENCOUNTER — Ambulatory Visit (INDEPENDENT_AMBULATORY_CARE_PROVIDER_SITE_OTHER): Payer: Medicare Other | Admitting: Surgery

## 2012-06-01 ENCOUNTER — Encounter (INDEPENDENT_AMBULATORY_CARE_PROVIDER_SITE_OTHER): Payer: Self-pay | Admitting: Surgery

## 2012-06-01 VITALS — BP 148/88 | HR 98 | Temp 99.0°F | Resp 18 | Ht 70.0 in | Wt 160.8 lb

## 2012-06-01 DIAGNOSIS — M1610 Unilateral primary osteoarthritis, unspecified hip: Secondary | ICD-10-CM

## 2012-06-01 DIAGNOSIS — R109 Unspecified abdominal pain: Secondary | ICD-10-CM

## 2012-06-01 DIAGNOSIS — K429 Umbilical hernia without obstruction or gangrene: Secondary | ICD-10-CM

## 2012-06-01 DIAGNOSIS — R1030 Lower abdominal pain, unspecified: Secondary | ICD-10-CM

## 2012-06-01 DIAGNOSIS — M160 Bilateral primary osteoarthritis of hip: Secondary | ICD-10-CM

## 2012-06-01 DIAGNOSIS — M169 Osteoarthritis of hip, unspecified: Secondary | ICD-10-CM

## 2012-06-01 NOTE — Patient Instructions (Signed)
Groin Strain (Adductor Strain) A groin pull/strain refers to strained muscles on the upper inner part of the thigh. This usually occurs in muscles during exercise or participation in sports events. It is more likely to occur when your muscles are not warmed up well enough or if you are not properly conditioned. A pulled muscle, or muscle strain, occurs when a muscle is over stretched and some muscle fibers are torn. This is especially common in athletes where a sudden violent force placed on a muscle pushes it past its ability to respond. Usually, recovery from a pulled muscle takes 1 to 2 weeks, but complete healing Erdem take 5 to 6 weeks.  HOME CARE INSTRUCTIONS   Apply ice to the sore muscle for 15 to 20 minutes every 2-3 hours for the first 2 days. Put ice in a plastic bag and place a towel between the bag of ice and your skin.   Do not strain the pulled muscle for as long as it is still painful.   Only take over-the-counter or prescription medicines for pain, discomfort, or fever as directed by your caregiver. Do not use aspirin as this may increase bleeding (bruising) at injury site.   Warming up before exercise and developing proper conditioning helps prevent muscle strains.  SEEK IMMEDIATE MEDICAL CARE IF:   There is increased pain or swelling in the affected area.   You are not improving or are getting worse.  Document Released: 07/21/2004 Document Revised: 11/12/2011 Document Reviewed: 07/16/2009 [facility] Patient Information 2012 [place], [place].  Managing Pain  Pain after surgery or related to activity is often due to strain/injury to muscle, tendon, nerves and/or incisions.  This pain is usually short-term and Dom improve in a few months.   Many people find it helpful to do the following things TOGETHER to help speed the process of healing and to get back to regular activity more quickly:  1. Avoid heavy physical activity a.  no lifting greater than 20 pounds b. Do not "push  through" the pain.  Listen to your body and avoid positions and maneuvers than reproduce the pain c. Walking is okay as tolerated, but go slowly and stop when getting sore.  d. Remember: If it hurts to do it, then don't do it! 2. Take Anti-inflammatory medication  a. Take with food/snack around the clock for 1-2 weeks i. This helps the muscle and nerve tissues become less irritable and calm down faster b. Choose ONE of the following over-the-counter medications: i. Naproxen 220mg  tabs (ex. Aleve) 1-2 pills twice a day  ii. Ibuprofen 200mg  tabs (ex. Advil, Motrin) 3-4 pills with every meal and just before bedtime iii. Acetaminophen 500mg  tabs (Tylenol) 1-2 pills with every meal and just before bedtime 3. Use a Heating pad or Ice/Cold Pack a. 4-6 times a day b. May use warm bath/hottub  or showers 4. Try Gentle Massage and/or Stretching  a. at the area of pain many times a day b. stop if you feel pain - do not overdo it  Try these steps together to help you body heal faster and avoid making things get worse.  Doing just one of these things may not be enough.    If you are not getting better after two weeks or are noticing you are getting worse, contact our office for further advice; we may need to re-evaluate you & see what other things we can do to help.

## 2012-06-01 NOTE — Progress Notes (Signed)
Subjective:     Patient ID: Craig Moore, male   DOB: [birth year], 71 y.o.   MRN: [id number]  HPI  Craig Moore  26-Dec-1940 [id number]  Patient Care Team: Brent Bulla, MD as PCP - General (Family Medicine) Luis Abed, MD as Consulting Physician (Cardiology)  This patient is a 71 y.o.male who presents today for surgical evaluation at the request of Dr. Gwendolyn Grant.   Reason for visit: Right groin strain.  Question of femoral hernia.  Patient is a pleasant active male.  Golfs often.  He noted some right groin pain  about two months ago.  It was worse after walking.  Often with prolonged standing he would get worse as well.  He was walking with a limp.  Workup did not show any definite hernia but there was suspicion on physical exam or perhaps a femoral bulge/hernia.  Initially anti-inflammatories did not work.   However, he increased the Aleve to 2 pills three times a day.  It is definitely improved over the last few weeks.  It is much milder now.  He's weaned himself down to one pill a couple times a day.  He felt like the right side was more puffy but confesses it's not nearly as bad as it was.  No history of prior hernia issues aside from a tiny bellybutton hernia he's had all his life that has not gotten bigger or hurt him..  No history of fall or trauma.  Patient Active Problem List  Diagnosis  . DYSLIPIDEMIA  . TOBACCO ABUSE  . HYPERTENSION, UNSPECIFIED  . CAD (coronary artery disease)  . Ejection fraction  . Pre-syncope  . DVT (deep venous thrombosis)  . Groin pain, right inner    Past Medical History  Diagnosis Date  . Hyperlipidemia   . Hypertension   . Myocardial infarction   . CAD (coronary artery disease)     Non-STEMI May, 2006, bare-metal stent  //   nuclear, January, 2012, EF 70%, no ischemia  . Ejection fraction     EF 55%, echo, October, 2006, technically difficult  //  EF 70%, nuclear, January, 2012  . Tobacco abuse   . Pre-syncope     ER, January, 2011,  dehydration  . DVT (deep venous thrombosis)     December, 2011, Coumadin therapy  . Groin pain     June, 2013  . CHF (congestive heart failure)     Past Surgical History  Procedure Date  . Coronary angioplasty 2006    History   Social History  . Marital Status: Legally Separated    Spouse Name: N/A    Number of Children: N/A  . Years of Education: N/A   Occupational History  . Not on file.   Social History Main Topics  . Smoking status: Current Everyday Smoker -- 0.2 packs/day    Types: Cigarettes  . Smokeless tobacco: Not on file  . Alcohol Use: 4.2 oz/week    7 Glasses of wine per week  . Drug Use: No  . Sexually Active: Not Currently   Other Topics Concern  . Not on file   Social History Narrative  . No narrative on file    Family History  Problem Relation Age of Onset  . Cancer Mother   . Heart disease Father   . Diabetes Father   . Stroke Father   . Cancer Sister     breast    Current Outpatient Prescriptions  Medication Sig Dispense Refill  . aspirin EC 81  MG EC tablet Take 1 tablet (81 mg total) by mouth daily.  150 tablet  2  . ezetimibe (ZETIA) 10 MG tablet Take 1 tablet (10 mg total) by mouth daily.  30 tablet  0  . furosemide (LASIX) 20 MG tablet TAKE 1 TABLET BY MOUTH ONCE DAILY  30 tablet  2  . lisinopril (PRINIVIL,ZESTRIL) 10 MG tablet TAKE 1 TABLET BY MOUTH ONCE DAILY  30 tablet  2  . metoprolol succinate (TOPROL-XL) 50 MG 24 hr tablet TAKE 1 TABLET BY MOUTH ONCE DAILY  30 tablet  2     No Known Allergies  BP 148/88  Pulse 98  Temp 99 F (37.2 C) (Temporal)  Resp 18  Ht 5\' 10"  (1.778 m)  Wt 160 lb 12.8 oz (72.938 kg)  BMI 23.07 kg/m2  Ct Pelvis Wo Contrast  05/30/2012  *RADIOLOGY REPORT*  Clinical Data:  Right groin pain for 1 month.  Inguinal hernia.  CT PELVIS WITHOUT CONTRAST  Technique:  Multidetector CT imaging of the pelvis was performed following the standard protocol without intravenous contrast.  Comparison:   None.   Findings:  There is no inguinal hernia.  The inguinal canal appears normal and symmetric bilaterally.  Aorto iliac atherosclerosis is present extending in the femoral arteries.  No aneurysm is identified.  Colonic diverticulosis.  Prostatomegaly with 5 cm transverse axis of the prostate gland. There are areas of thickening of the wall of the sigmoid colon (image 23 series 2) which may represent diverticular hypertrophy.  Urinary bladder appears within normal limits.  Small bowel normal. Normal appendix identified.  Severe right hip osteoarthritis is present with denuded articular cartilage and essentially bone on bone contact.  Moderate to severe left hip osteoarthritis is present.  Pubic symphysis appears within normal limits. Partially visualized severe lumbar facet arthrosis and mild degenerative disc disease at L5-S1. There is isolated atrophy of the dorsal aspect of the right rectus femoris muscle (image 59 series 2).  Mild SI joint degenerative disease.  The CT scan was repeated with marker in place over the area of palpable abnormality.  Spermatic cord in this region appears normal.  There is no inguinal adenopathy.  No mass lesion is identified in the right inguinal canal.  IMPRESSION: 1.  Negative for inguinal hernia. 2.  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 3.  Lumbar facet arthrosis. 4.  Sigmoid colon mural thickening may be due to diverticular hypertrophy or under distention.  Correlation with colonoscopy recommended if not recently performed.  Original Report Authenticated By: Andreas Newport, M.D.     Review of Systems  Constitutional: Negative for fever, chills and diaphoresis.  HENT: Negative for nosebleeds, sore throat, facial swelling, mouth sores, trouble swallowing and ear discharge.   Eyes: Negative for photophobia, discharge and visual disturbance.  Respiratory: Negative for choking, chest tightness, shortness of breath and stridor.     Cardiovascular: Negative for chest pain and palpitations.       Patient walks 30 minutes without difficulty.  Golfs regularly.  No exertional chest/neck/shoulder/arm pain.   Gastrointestinal: Negative for nausea, vomiting, abdominal pain, diarrhea, constipation, blood in stool, abdominal distention, anal bleeding and rectal pain.       No personal nor family history of GI/colon cancer, inflammatory bowel disease, irritable bowel syndrome, allergy such as Celiac Sprue, dietary/dairy problems, colitis, ulcers nor gastritis.    No recent sick contacts/gastroenteritis.  No travel outside the country.  No changes in diet.  Genitourinary: Negative for dysuria, urgency, difficulty urinating and testicular pain.  Musculoskeletal: Negative for myalgias, back pain, arthralgias and gait problem.  Skin: Negative for color change, pallor, rash and wound.  Neurological: Negative for dizziness, speech difficulty, weakness, numbness and headaches.  Hematological: Negative for adenopathy. Does not bruise/bleed easily.  Psychiatric/Behavioral: Negative for hallucinations, confusion and agitation.       Objective:   Physical Exam  Constitutional: He is oriented to person, place, and time. He appears well-developed and well-nourished. No distress.  HENT:  Head: Normocephalic.  Mouth/Throat: Oropharynx is clear and moist and mucous membranes are normal. Abnormal dentition. No oropharyngeal exudate or posterior oropharyngeal edema.  Eyes: Conjunctivae and EOM are normal. Pupils are equal, round, and reactive to light. No scleral icterus.  Neck: Normal range of motion. Neck supple. No tracheal deviation present.  Cardiovascular: Normal rate, regular rhythm and intact distal pulses.   Pulmonary/Chest: Effort normal and breath sounds normal. No respiratory distress.  Abdominal: Soft. He exhibits no distension. There is no tenderness. Hernia confirmed negative in the right inguinal area and confirmed negative  in the left inguinal area.  Genitourinary: Right testis shows no mass and no tenderness. Left testis shows no mass and no tenderness. Uncircumcised. No phimosis.       No mass mass in groins.  No femoral bulge/mass.  Musculoskeletal: Normal range of motion. He exhibits no tenderness.  Lymphadenopathy:    He has no cervical adenopathy.       Right: No inguinal adenopathy present.       Left: No inguinal adenopathy present.  Neurological: He is alert and oriented to person, place, and time. No cranial nerve deficit. He exhibits normal muscle tone. Coordination normal.  Skin: Skin is warm and dry. No rash noted. He is not diaphoretic. No erythema. No pallor.  Psychiatric: He has a normal mood and affect. His behavior is normal. Judgment and thought content normal.       Assessment:     Right groin pain/swelling resolving with NSAIDs    Plan:     At this point, he seems to be improving.  I can feel no definite hernia.  I see no definite mass.  CAT scan does not raise any high suspicions.  I am leaning more towards a groin strain/hematoma.  Was related to some activity.  At this point I do not think he needs any diagnostic laparoscopy or anything more aggressive.It does not sound classic hip pain either, but that is in the differential as well.  Stop smoking.  Increase activity as tolerated.  Do not push through pain.Light duty before advancing back to unrestricted activity such as golf.  Bowel regimen to avoid problems.  Return to clinic p.r.n. If he suddenly gets worsening pain a recurrent or worsening balked, then I would proceed with diagnostic laparoscopy with a low threshold to do a mesh repair.The patient expressed understanding and appreciation  His umbilical hernia is small and has not changed over the decades.  It does not hurt or bother him.  I did not urge umbilical hernia repair at this time but just to be aware and call if anything changes.  He wants to hold off on any surgery  there as well

## 2012-07-03 ENCOUNTER — Other Ambulatory Visit: Payer: Self-pay | Admitting: Cardiology

## 2012-09-21 ENCOUNTER — Ambulatory Visit (HOSPITAL_COMMUNITY)
Admission: RE | Admit: 2012-09-21 | Discharge: 2012-09-21 | Disposition: A | Discharge status: Home / Self Care | Payer: Medicare Other | Source: Ambulatory Visit | Attending: Family Medicine | Admitting: Family Medicine

## 2012-09-21 ENCOUNTER — Encounter: Payer: Self-pay | Admitting: Family Medicine

## 2012-09-21 ENCOUNTER — Telehealth: Payer: Self-pay | Admitting: Family Medicine

## 2012-09-21 ENCOUNTER — Ambulatory Visit (INDEPENDENT_AMBULATORY_CARE_PROVIDER_SITE_OTHER): Payer: Medicare Other | Admitting: Family Medicine

## 2012-09-21 VITALS — BP 132/81 | HR 73 | Temp 97.8°F | Wt 157.1 lb

## 2012-09-21 DIAGNOSIS — I825Y9 Chronic embolism and thrombosis of unspecified deep veins of unspecified proximal lower extremity: Secondary | ICD-10-CM | POA: Insufficient documentation

## 2012-09-21 DIAGNOSIS — M7989 Other specified soft tissue disorders: Secondary | ICD-10-CM

## 2012-09-21 DIAGNOSIS — I824Y9 Acute embolism and thrombosis of unspecified deep veins of unspecified proximal lower extremity: Secondary | ICD-10-CM | POA: Insufficient documentation

## 2012-09-21 DIAGNOSIS — M79609 Pain in unspecified limb: Secondary | ICD-10-CM

## 2012-09-21 DIAGNOSIS — I82409 Acute embolism and thrombosis of unspecified deep veins of unspecified lower extremity: Secondary | ICD-10-CM

## 2012-09-21 DIAGNOSIS — M79605 Pain in left leg: Secondary | ICD-10-CM

## 2012-09-21 MED ORDER — RIVAROXABAN 20 MG PO TABS: 20.0000 mg | ORAL_TABLET | Freq: Every day | ORAL | Status: DC

## 2012-09-21 MED ORDER — RIVAROXABAN 15 MG PO TABS: 15.0000 mg | ORAL_TABLET | Freq: Two times a day (BID) | ORAL | Status: DC

## 2012-09-21 NOTE — Progress Notes (Signed)
VASCULAR LAB PRELIMINARY  PRELIMINARY  PRELIMINARY  PRELIMINARY  Bilateral lower extremity venous duplex completed.    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  Craig Moore, RVS 09/21/2012, 3:20 PM

## 2012-09-21 NOTE — Addendum Note (Signed)
Addended byDenny Levy L on: 09/21/2012 03:07 PM   Modules accepted: Orders

## 2012-09-21 NOTE — Progress Notes (Addendum)
Patient ID: Craig Moore, male   DOB: 1941-09-04, 71 y.o.   MRN: [id number] Has DVT---has had one before. Saw Dr Louanne Belton this morning. I discussed DVT. He has no questions---is glad he does not have to do 'thiose shots" again. I gave him Rivaroaban 15 mg ---# ten total tabs of samples, a  rx for 15 mg tabs  And a Rx for the 20 mg tabs. And he is to see Dr Louanne Belton in 2 weeks. Can transition him to 20 mg tabs then. If no problems with the first three weeks. I gave him BOTH Rx and a coupon.

## 2012-09-21 NOTE — Addendum Note (Signed)
Addended by: Farrell Ours on: 09/21/2012 03:04 PM   Modules accepted: Orders

## 2012-09-21 NOTE — Progress Notes (Signed)
Patient ID: Craig Moore, male   DOB: [birth year], 71 y.o.   MRN: [id number] Subjective: The patient is a 71 y.o. year old male who presents today for left leg pain and swelling.  Pain began about a week ago and was located in calf of the left leg.  Accompanied by some intermittent swelling.  Swelling worse with standing or later in the day, better in the mornings.  Pain is an ache or tightness.  Initially thought nothing of it but, as it was not getting better after 6 days, he decided to come in for a visit.  He denies any chest pain, shortness of breath, pain with inspiration, bleeding, bruising, rashes, visual changes, or syncope/near syncope.  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  He is not on any anticoagulation now.  Patient's past medical, social, and family history were reviewed and updated as appropriate. History  Substance Use Topics  . Smoking status: Current Every Day Smoker -- 0.2 packs/day    Types: Cigarettes  . Smokeless tobacco: Not on file  . Alcohol Use: 4.2 oz/week    7 Glasses of wine per week   Objective:  Filed Vitals:   09/21/12 1146  BP: 132/81  Pulse: 73  Temp: 97.8 F (36.6 C)   Gen: NAD CV: RRR Resp: CTABL Ext: Right leg is non-painful.  I do not detect any edema but patient says that it is much larger than it was prior to when he had his DVT in it.  Left leg also has no edema but patient reports that it is larger than normal.  There are no palpable ropes present but the calf is mildly tender.  Pulses are preserved.  Assessment/Plan:  Please also see individual problems in problem list for problem-specific plans.

## 2012-09-21 NOTE — Patient Instructions (Addendum)
It was good to see you today. You need to get an ultrasound of your leg.  If it shows a blood clot we Navin be calling you to talk about starting to treat that. If it does not show a clot then I think you have a pulled muscle and should take 650mg  of Tylenol three times per day for the next week. ADDENDUM: You DO have a clot on your leg. I have given you 5 days of medicine. Take one tab in the morning and one at night. Get the Rx filled for the remaining medicine. You Nikalas need a total of 21 days at the twice a day dose, then we Kilo move to a ONCE A DAY dose of 20 mg.

## 2012-09-21 NOTE — Telephone Encounter (Signed)
Paged by vascular lab.  Patient has acute DVT of left popliteal vein.  This corresponds with location of patient's discomfort on exam.  Have instructed him to return to [facility].  As he has medicare he should be able to afford Xarelto but I want someone to speak with him personally about the importance of taking it.  He should be started on 15mg  BID x 21 days then transitioned to 20mg  daily for life (second occurrence of DVT unprovoked).  I asked Dr. Jennette Kettle who is precepting this afternoon to make certain that he was given a prescription when he came to the clinic and that he either be given 1-2 days of samples if there was any question about his ability to afford filling a prescription of xarelto, or that he be able to guarantee that he Craig Moore be able get it filled and started today.

## 2012-09-21 NOTE — Assessment & Plan Note (Signed)
Due to history of DVT Johnpatrick obtain LE doplers.  If these are positive would plan Xarelto.  If negative would plan tylenol.  No signs of infection at this time.

## 2012-09-21 NOTE — Addendum Note (Signed)
Addended by: Farrell Ours on: 09/21/2012 03:01 PM   Modules accepted: Orders

## 2012-10-04 ENCOUNTER — Encounter: Payer: Self-pay | Admitting: Family Medicine

## 2012-10-04 ENCOUNTER — Ambulatory Visit (INDEPENDENT_AMBULATORY_CARE_PROVIDER_SITE_OTHER): Payer: Medicare Other | Admitting: Family Medicine

## 2012-10-04 VITALS — BP 141/71 | HR 74 | Temp 98.2°F | Wt 155.2 lb

## 2012-10-04 DIAGNOSIS — I82409 Acute embolism and thrombosis of unspecified deep veins of unspecified lower extremity: Secondary | ICD-10-CM

## 2012-10-11 NOTE — Progress Notes (Signed)
Patient ID: Craig Moore, male   DOB: 1941/03/03, 71 y.o.   MRN: [id number] Subjective: The patient is a 71 y.o. year old male who presents today for f/u DVT.  Has several days more of BID xarelto and rx for 20mg  daily dose.  No residual pain.  Feels leg is returning to normal size.  No CP/SOB/DOE.  No new leg swelling or pain.  Patient's past medical, social, and family history were reviewed and updated as appropriate. History  Substance Use Topics  . Smoking status: Current Every Day Smoker -- 0.2 packs/day    Types: Cigarettes  . Smokeless tobacco: Not on file  . Alcohol Use: 4.2 oz/week    7 Glasses of wine per week   Objective:  Filed Vitals:   10/04/12 1617  BP: 141/71  Pulse: 74  Temp: 98.2 F (36.8 C)   Gen: NAD Ext: No significant swelling or pain on palpation of either calf. 2+ pulses  Assessment/Plan:  Please also see individual problems in problem list for problem-specific plans.

## 2012-10-11 NOTE — Assessment & Plan Note (Signed)
Appears to be progressing well with no evidence of DVT extension on exam.  Have encouraged patient to price 20mg  rx before he runs out of 15mg  bid so he can make sure there are not financial problems with the medication.  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

## 2012-11-09 ENCOUNTER — Other Ambulatory Visit: Payer: Self-pay | Admitting: Family Medicine

## 2012-11-09 MED ORDER — RIVAROXABAN 20 MG PO TABS: 20.0000 mg | ORAL_TABLET | Freq: Every day | ORAL | Status: DC

## 2012-12-23 ENCOUNTER — Telehealth: Payer: Self-pay | Admitting: *Deleted

## 2012-12-23 NOTE — Telephone Encounter (Signed)
Patient comes to office stating he started Xarelto 2 months ago. Last night he saw on TV a commercial about Xarelto and it states if you have tingling to let your doctor know.. Patient has been having tingling of scalp since he started  Xarelto. Dr. Louanne Belton  consulted with Dr. Raymondo Band and he advises does not seem to be a concern or side effect of medication. If he is interested in restarting coumadin or if problem really bothering him  ,come in to see Dr. Louanne Belton so they can discuss. Appointment scheduled.

## 2012-12-28 ENCOUNTER — Ambulatory Visit: Payer: Medicare Other | Admitting: Family Medicine

## 2013-01-11 ENCOUNTER — Other Ambulatory Visit: Payer: Self-pay | Admitting: Family Medicine

## 2013-01-11 ENCOUNTER — Telehealth: Payer: Self-pay | Admitting: Family Medicine

## 2013-01-11 NOTE — Telephone Encounter (Signed)
Patient comes in and states that he has no other refill of XARELTO.  Patient does not understand why he did not receive a year's worth of refills. I let patient know that all refill request should come from pharmacy. Pt is completely out as of today.

## 2013-01-11 NOTE — Telephone Encounter (Signed)
rx was called in [place],pt was informed. Craig Moore, Virgel Bouquet

## 2013-01-13 ENCOUNTER — Ambulatory Visit (INDEPENDENT_AMBULATORY_CARE_PROVIDER_SITE_OTHER): Payer: Medicare Other | Admitting: Family Medicine

## 2013-01-13 ENCOUNTER — Encounter: Payer: Self-pay | Admitting: Family Medicine

## 2013-01-13 VITALS — BP 139/76 | HR 76 | Temp 99.4°F | Ht 70.0 in | Wt 156.0 lb

## 2013-01-13 DIAGNOSIS — E785 Hyperlipidemia, unspecified: Secondary | ICD-10-CM

## 2013-01-13 DIAGNOSIS — I82409 Acute embolism and thrombosis of unspecified deep veins of unspecified lower extremity: Secondary | ICD-10-CM

## 2013-01-13 DIAGNOSIS — I10 Essential (primary) hypertension: Secondary | ICD-10-CM

## 2013-01-13 LAB — LDL CHOLESTEROL, DIRECT: Direct LDL: 67 mg/dL

## 2013-01-13 MED ORDER — AMITRIPTYLINE HCL 10 MG PO TABS: 10.0000 mg | ORAL_TABLET | Freq: Every day | ORAL | Status: DC

## 2013-01-13 NOTE — Assessment & Plan Note (Signed)
Recheck cholesterol today

## 2013-01-13 NOTE — Assessment & Plan Note (Signed)
Discuss with the patient, abdomen, the fact that is relatively Coumadin regularly to prevent choices for treatment of DVT. Patient is not interested in Coumadin therapy at this time. His been on this before and did not like the number of times he has to come to see the physician. He decided that he Abdulkadir continue was also. We Jasiah give a trial of amitriptyline in case this is something neuropathic.

## 2013-01-13 NOTE — Patient Instructions (Signed)
It was good to see you today! I would like for you to try taking 10mg  of amitriptyline at night for the tingling in your scalp.  Let me know if it is helping. We Cardell check your cholesterol today. I would recommend that you get the pneumonia shot and a colonoscopy.  Let me know when you decide to do either of those.

## 2013-01-13 NOTE — Assessment & Plan Note (Signed)
Currently appropriately controlled. Continue current medications

## 2013-01-13 NOTE — Progress Notes (Signed)
Patient ID: Donzell Coller, male   DOB: 1941/07/01, 72 y.o.   MRN: [id number] Subjective: The patient is a 72 y.o. year old male who presents today for followup.  1. DVT: The patient is taking several toe as prescribed. He continues to have problems with a tingling sensation on the left posterior portion of his scalp. This has been made worse by concentrating. Is not associated with any visual changes, difficulty speaking, difficulty swallowing, loss of balance, or change in personality. He reports the swelling and pain in his legs has decreased significantly. Is not having any problems with shortness of breath or chest pain.  2. Dyslipidemia: patient continues to take his Zetia.  is not reporting any problems with his medication. Cholesterol has not been checked in close to a year.  3. Health maintenance: Patient is due for a pneumococcal vaccination in colonoscopy. He is managed in either of these this time. This was discussed with him in depth.  Patient's past medical, social, and family history were reviewed and updated as appropriate. History  Substance Use Topics  . Smoking status: Current Every Day Smoker -- 0.2 packs/day    Types: Cigarettes  . Smokeless tobacco: Not on file  . Alcohol Use: 4.2 oz/week    7 Glasses of wine per week   Objective:  Filed Vitals:   01/13/13 1422  BP: 139/76  Pulse: 76  Temp: 99.4 F (37.4 C)   Gen:  no acute distress, thin, appropriate CV:  regular rate and rhythm, no murmurs appreciated Resp:  clear to auscultation bilaterally  Ext: there is no appreciable swelling of the leg. There is no pain on palpation of either.   Assessment/Plan:  have discussed importance of health maintenance with the patient. He Camillo contact me when he is ready to pursue recommended health maintenance.   Please also see individual problems in problem list for problem-specific plans.

## 2013-01-16 ENCOUNTER — Encounter: Payer: Self-pay | Admitting: Family Medicine

## 2013-04-05 ENCOUNTER — Other Ambulatory Visit: Payer: Self-pay | Admitting: *Deleted

## 2013-04-06 MED ORDER — AMITRIPTYLINE HCL 10 MG PO TABS: 10.0000 mg | ORAL_TABLET | Freq: Every day | ORAL | Status: DC

## 2013-04-17 ENCOUNTER — Other Ambulatory Visit: Payer: Self-pay | Admitting: Family Medicine

## 2013-05-20 ENCOUNTER — Other Ambulatory Visit: Payer: Self-pay | Admitting: Family Medicine

## 2013-06-02 ENCOUNTER — Other Ambulatory Visit: Payer: Self-pay | Admitting: *Deleted

## 2013-06-02 MED ORDER — AMITRIPTYLINE HCL 10 MG PO TABS: 10.0000 mg | ORAL_TABLET | Freq: Every day | ORAL | Status: DC

## 2013-06-05 ENCOUNTER — Other Ambulatory Visit: Payer: Self-pay | Admitting: *Deleted

## 2013-06-05 MED ORDER — AMITRIPTYLINE HCL 10 MG PO TABS: 10.0000 mg | ORAL_TABLET | Freq: Every day | ORAL | Status: DC

## 2013-06-05 NOTE — Telephone Encounter (Signed)
Refilled based on Dr. Louanne Belton last note.  I know that amitriptyline is not recommended for elderly.  Low dose makes and Dr. Radonna Ricker note make me think OK, although I would revisit at next visit.

## 2013-06-09 ENCOUNTER — Other Ambulatory Visit: Payer: Self-pay | Admitting: Cardiology

## 2013-07-05 ENCOUNTER — Encounter: Payer: Self-pay | Admitting: Cardiology

## 2013-07-05 ENCOUNTER — Ambulatory Visit (INDEPENDENT_AMBULATORY_CARE_PROVIDER_SITE_OTHER): Payer: Medicare Other | Admitting: Cardiology

## 2013-07-05 VITALS — BP 112/60 | HR 72 | Ht 71.0 in | Wt 155.0 lb

## 2013-07-05 DIAGNOSIS — I2510 Atherosclerotic heart disease of native coronary artery without angina pectoris: Secondary | ICD-10-CM

## 2013-07-05 DIAGNOSIS — E785 Hyperlipidemia, unspecified: Secondary | ICD-10-CM

## 2013-07-05 DIAGNOSIS — F17200 Nicotine dependence, unspecified, uncomplicated: Secondary | ICD-10-CM

## 2013-07-05 DIAGNOSIS — I10 Essential (primary) hypertension: Secondary | ICD-10-CM

## 2013-07-05 NOTE — Assessment & Plan Note (Signed)
He continues to smoke a few cigarettes daily. I've encouraged him to stop completely.

## 2013-07-05 NOTE — Progress Notes (Signed)
HPI  Patient is seen to followup coronary disease. He's doing well. I saw him last in June, 2013. He received a bare-metal stent 2006. Nuclear study in 2012 revealed no ischemia. He has good left ventricular function. 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 He is now on Xarelto under a happy taking it. He's not having any significant chest pain.  No Known Allergies  Current Outpatient Prescriptions  Medication Sig Dispense Refill  . aspirin EC 81 MG EC tablet Take 1 tablet (81 mg total) by mouth daily.  150 tablet  2  . furosemide (LASIX) 20 MG tablet TAKE 1 TABLET BY MOUTH ONCE DAILY  90 tablet  0  . lisinopril (PRINIVIL,ZESTRIL) 10 MG tablet TAKE 1 TABLET BY MOUTH ONCE DAILY  90 tablet  0  . metoprolol succinate (TOPROL-XL) 50 MG 24 hr tablet TAKE 1 TABLET BY MOUTH ONCE DAILY  90 tablet  0  . XARELTO 20 MG TABS TAKE 1 TABLET BY MOUTH EVERY DAY  30 tablet  3  . ZETIA 10 MG tablet TAKE 1 TABLET (10 MG TOTAL) BY MOUTH DAILY.  90 tablet  0   No current facility-administered medications for this visit.    History   Social History  . Marital Status: Legally Separated    Spouse Name: N/A    Number of Children: N/A  . Years of Education: N/A   Occupational History  . Not on file.   Social History Main Topics  . Smoking status: Current Every Day Smoker -- 0.25 packs/day    Types: Cigarettes  . Smokeless tobacco: Not on file  . Alcohol Use: 4.2 oz/week    7 Glasses of wine per week  . Drug Use: No  . Sexually Active: Not Currently   Other Topics Concern  . Not on file   Social History Narrative  . No narrative on file    Family History  Problem Relation Age of Onset  . Cancer Mother   . Heart disease Father   . Diabetes Father   . Stroke Father   . Cancer Sister     breast    Past Medical History  Diagnosis Date  . Hyperlipidemia   . Hypertension   . Myocardial infarction   . CAD (coronary artery disease)     Non-STEMI May, 2006, bare-metal  stent  //   nuclear, January, 2012, EF 70%, no ischemia  . Ejection fraction     EF 55%, echo, October, 2006, technically difficult  //  EF 70%, nuclear, January, 2012  . Tobacco abuse   . Pre-syncope     ER, January, 2011, dehydration  . DVT (deep venous thrombosis)     December, 2011, Coumadin therapy  . Groin pain     June, 2013  . CHF (congestive heart failure)     Past Surgical History  Procedure Laterality Date  . Coronary angioplasty  2006    Patient Active Problem List   Diagnosis Date Noted  . Leg pain, left 09/21/2012  . Umbilical hernia 06/01/2012  . Osteoarthritis of both hips 06/01/2012  . Groin pain, right inner   . CAD (coronary artery disease)   . Ejection fraction   . Pre-syncope   . DVT (deep venous thrombosis)   . DYSLIPIDEMIA 02/23/2010  . TOBACCO ABUSE 02/23/2010  . HYPERTENSION, UNSPECIFIED 02/21/2010    ROS   Patient denies fever, chills, headache, sweats, rash, change in vision, change in hearing, chest pain, cough, nausea  vomiting, urinary symptoms. All other systems are reviewed and are negative.  PHYSICAL EXAM  Patient is oriented to person time and place. Affect is normal. He has poor dentition. There is no jugulovenous distention. Lungs are clear. Respiratory effort is nonlabored. Cardiac exam her vitals S1 and S2. There are no clicks or significant murmurs. The abdomen is soft. There is no peripheral edema.  Filed Vitals:   07/05/13 1516  BP: 112/60  Pulse: 72  Height: 5\' 11"  (1.803 m)  Weight: 155 lb (70.308 kg)   EKG is done today and reviewed by me. There is no significant abnormality.  ASSESSMENT & PLAN

## 2013-07-05 NOTE — Assessment & Plan Note (Signed)
The patient is on Zetia. I tried to see if there was a prior history of problems with statins. I cannot find any problems. The patient is not aware. I would be in favor of him being on a statin as the guidelines recommend.

## 2013-07-05 NOTE — Assessment & Plan Note (Signed)
Blood pressure is controlled. No change in therapy. 

## 2013-07-05 NOTE — Assessment & Plan Note (Signed)
Coronary disease is stable. No change in therapy. 

## 2013-07-05 NOTE — Patient Instructions (Signed)
**Note De-identified Craig Moore Obfuscation** Your physician recommends that you continue on your current medications as directed. Please refer to the Current Medication list given to you today.  Your physician wants you to follow-up in: 1 year. [name] receive a reminder letter in the mail two months in advance. If you don't receive a letter, please call our office to schedule the follow-up appointment.  

## 2013-07-11 ENCOUNTER — Other Ambulatory Visit: Payer: Self-pay | Admitting: Cardiology

## 2013-09-28 ENCOUNTER — Telehealth: Payer: Self-pay | Admitting: *Deleted

## 2013-09-28 DIAGNOSIS — I82409 Acute embolism and thrombosis of unspecified deep veins of unspecified lower extremity: Secondary | ICD-10-CM

## 2013-09-28 MED ORDER — RIVAROXABAN 20 MG PO TABS: 20.0000 mg | ORAL_TABLET | Freq: Every day | ORAL | Status: DC

## 2013-09-28 NOTE — Telephone Encounter (Signed)
Pharmacy calling asking for refill of Xarelto for patient. Nykeem forward to PCP

## 2013-09-28 NOTE — Telephone Encounter (Signed)
Patient came into office requesting refill of Xarelto.  Last OV was 01/2013.  Was seen by cardiologist in 06/2013.  Informed patient that med would be refilled x 1 month with no refills.  Brownie need OV with Dr. Caleb Popp before next refill.  Patient scheduled f/u appt for 10/17/13 at 3:30 pm.  Gaylene Brooks, RN

## 2013-10-17 ENCOUNTER — Encounter: Payer: Self-pay | Admitting: Family Medicine

## 2013-10-17 ENCOUNTER — Ambulatory Visit (INDEPENDENT_AMBULATORY_CARE_PROVIDER_SITE_OTHER): Payer: Medicare Other | Admitting: Family Medicine

## 2013-10-17 VITALS — BP 123/63 | HR 78 | Temp 99.6°F | Ht 71.0 in | Wt 146.0 lb

## 2013-10-17 DIAGNOSIS — I82409 Acute embolism and thrombosis of unspecified deep veins of unspecified lower extremity: Secondary | ICD-10-CM

## 2013-10-17 DIAGNOSIS — I2510 Atherosclerotic heart disease of native coronary artery without angina pectoris: Secondary | ICD-10-CM

## 2013-10-17 DIAGNOSIS — I10 Essential (primary) hypertension: Secondary | ICD-10-CM

## 2013-10-17 LAB — COMPREHENSIVE METABOLIC PANEL
ALT: 20 U/L (ref 0–53)
Albumin: 3.5 g/dL (ref 3.5–5.2)
CO2: 22 mEq/L (ref 19–32)
Calcium: 8.6 mg/dL (ref 8.4–10.5)
Chloride: 100 mEq/L (ref 96–112)
Creat: 1.85 mg/dL — ABNORMAL HIGH (ref 0.50–1.35)
Potassium: 4.5 mEq/L (ref 3.5–5.3)
Total Protein: 6.7 g/dL (ref 6.0–8.3)

## 2013-10-17 NOTE — Patient Instructions (Addendum)
Hi Mr. Tennyson. Today we talked about your blood pressure and your Xarelto prescription. I Ruby fill out a prior authorization to have your Xarelto approved. I discussed starting a statin because of your heart history however you have decided not to take it. We discussed risk and benefits and you understood. You do not have to take your furosemide, however, if you start to get short of breath after some time, please start taking the medication and call me. Please take this pill once daily. Please also consider getting a colonoscopy and flu shot. Please also get your Zoster vaccine. Please see me in 6 months for a follow-up appointment.  Jacquelin Hawking, MD

## 2013-10-22 NOTE — Assessment & Plan Note (Signed)
Blood pressure controlled. Troye continue current regimen. 

## 2013-10-22 NOTE — Assessment & Plan Note (Signed)
Patient on Zetia and does not want to start statin therapy. Advised patient of recommendations with current CAD, but he does not want to change his medication regimen. Craig Moore continue Zetia

## 2013-10-22 NOTE — Assessment & Plan Note (Signed)
Deondrea continue Xarelto.

## 2013-10-22 NOTE — Progress Notes (Signed)
  Subjective:    Patient ID: Jacai Kipp, male    DOB: August 23, 1941, 72 y.o.   MRN: [id number]  HPI Comments: Mr. David is here to meet me and to get me to fill out a prior authorization for his Xarelto. He has had no complaints.     Review of Systems  Respiratory: Negative for shortness of breath.   Neurological: Negative for light-headedness and headaches.       Objective:   Physical Exam  Constitutional: He is oriented to person, place, and time. He appears well-developed and well-nourished. No distress.  Cardiovascular: Normal rate, regular rhythm and normal heart sounds.   Pulmonary/Chest: Effort normal and breath sounds normal. No respiratory distress.  Abdominal: Soft.  Musculoskeletal: Normal range of motion.       Right ankle: He exhibits no swelling.       Left ankle: He exhibits no swelling.  Neurological: He is alert and oriented to person, place, and time.  Skin: Skin is warm and dry. He is not diaphoretic.          Assessment & Plan:

## 2013-10-30 ENCOUNTER — Other Ambulatory Visit: Payer: Self-pay | Admitting: Family Medicine

## 2014-02-19 ENCOUNTER — Telehealth: Payer: Self-pay | Admitting: *Deleted

## 2014-02-19 NOTE — Telephone Encounter (Signed)
Called pt and LMVM to call back. Please ask pt, if he had flu shot (when and where) thanks. Javier Glazier, Gerrit Heck

## 2014-03-23 ENCOUNTER — Ambulatory Visit (INDEPENDENT_AMBULATORY_CARE_PROVIDER_SITE_OTHER): Payer: Medicare Other | Admitting: Family Medicine

## 2014-03-23 ENCOUNTER — Encounter: Payer: Self-pay | Admitting: Family Medicine

## 2014-03-23 VITALS — BP 124/77 | HR 74 | Temp 98.1°F | Ht 71.0 in | Wt 146.1 lb

## 2014-03-23 DIAGNOSIS — I10 Essential (primary) hypertension: Secondary | ICD-10-CM

## 2014-03-23 DIAGNOSIS — I2510 Atherosclerotic heart disease of native coronary artery without angina pectoris: Secondary | ICD-10-CM

## 2014-03-23 DIAGNOSIS — Z23 Encounter for immunization: Secondary | ICD-10-CM

## 2014-03-23 MED ORDER — ASPIRIN EC 81 MG PO TBEC: 81.0000 mg | DELAYED_RELEASE_TABLET | Freq: Every day | ORAL | Status: DC

## 2014-03-23 MED ORDER — RIVAROXABAN 20 MG PO TABS: 20.0000 mg | ORAL_TABLET | Freq: Every day | ORAL | Status: DC

## 2014-03-23 MED ORDER — FUROSEMIDE 20 MG PO TABS: ORAL_TABLET | ORAL | Status: DC

## 2014-03-23 MED ORDER — EZETIMIBE 10 MG PO TABS: ORAL_TABLET | ORAL | Status: DC

## 2014-03-23 MED ORDER — LISINOPRIL 10 MG PO TABS: 10.0000 mg | ORAL_TABLET | Freq: Every day | ORAL | Status: DC

## 2014-03-23 NOTE — Patient Instructions (Signed)
Craig Moore, it was a pleasure seeing you today. Today we talked about your blood pressure and your vaccinations. Your blood pressure is well controlled today, but I want to get some labs to check your kidney function. We agreed that you would come by next week to get your blood drawn. In addition to the kidney function, you Agustin also get the cholesterol lab test. Please make an appointment at the front for a lab visit. We also discussed you getting the Zoster (Shingles) vaccine and the Pneumococcal (Pneumonia) vaccine.  Please make an appointment to see me in 1 year. Or sooner, if needed.  If you have any questions or concerns, please do not hesitate to call the office at [phone].  Sincerely,  Cordelia Poche, MD

## 2014-03-24 NOTE — Progress Notes (Signed)
   Subjective:    Patient ID: Craig Moore, male    DOB: [birth year], 73 y.o.   MRN: [id number]  HPI  Patient presents today for follow-up of chronic issues. He has well controlled hypertension currently on lisinopril and metoprolol and adherent to regimen. He has hyperlipidemia currently controlled on Zetia with no desire to change treatment medication. 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   Social history: Not currently sexually active  Past medical history: No known allergies   Review of Systems  Respiratory: Negative for shortness of breath.   Cardiovascular: Negative for chest pain and palpitations.  Musculoskeletal: Negative for myalgias.  Neurological: Negative for headaches.  All other systems reviewed and are negative.      Objective:   Physical Exam  Vitals reviewed. Constitutional: He is oriented to person, place, and time. He appears well-developed and well-nourished.  Cardiovascular: Normal rate, regular rhythm and normal heart sounds.   No murmur heard. Pulmonary/Chest: Effort normal and breath sounds normal. No respiratory distress. He has no wheezes.  Musculoskeletal: Normal range of motion. He exhibits edema (slight on left foot). He exhibits no tenderness.  Neurological: He is alert and oriented to person, place, and time.      Assessment & Plan:

## 2014-03-24 NOTE — Assessment & Plan Note (Signed)
Patient's blood pressure is controlled. Craig Moore obtain bmet to assess kidney function since last was bumped. Craig Moore need to come off of ACE-i if rises >30%

## 2014-03-26 ENCOUNTER — Other Ambulatory Visit: Payer: Medicare Other

## 2014-03-26 DIAGNOSIS — I10 Essential (primary) hypertension: Secondary | ICD-10-CM

## 2014-03-26 LAB — BASIC METABOLIC PANEL
BUN: 13 mg/dL (ref 6–23)
CHLORIDE: 97 meq/L (ref 96–112)
CO2: 24 mEq/L (ref 19–32)
CREATININE: 1.7 mg/dL — AB (ref 0.50–1.35)
Calcium: 8.5 mg/dL (ref 8.4–10.5)
GLUCOSE: 70 mg/dL (ref 70–99)
Potassium: 4.9 mEq/L (ref 3.5–5.3)
Sodium: 131 mEq/L — ABNORMAL LOW (ref 135–145)

## 2014-03-26 NOTE — Progress Notes (Signed)
BMP DONE TODAY Craig Moore 

## 2014-05-08 ENCOUNTER — Telehealth: Payer: Self-pay | Admitting: Family Medicine

## 2014-05-30 NOTE — Telephone Encounter (Signed)
Called to setup appointment for AWV. When Pt returns call, schedule 60 min appointment.

## 2014-07-05 ENCOUNTER — Ambulatory Visit (INDEPENDENT_AMBULATORY_CARE_PROVIDER_SITE_OTHER): Payer: Medicare Other | Admitting: Cardiology

## 2014-07-05 ENCOUNTER — Encounter: Payer: Self-pay | Admitting: Cardiology

## 2014-07-05 VITALS — BP 142/82 | HR 72 | Ht 71.0 in | Wt 152.8 lb

## 2014-07-05 DIAGNOSIS — I10 Essential (primary) hypertension: Secondary | ICD-10-CM

## 2014-07-05 DIAGNOSIS — I2510 Atherosclerotic heart disease of native coronary artery without angina pectoris: Secondary | ICD-10-CM

## 2014-07-05 DIAGNOSIS — E785 Hyperlipidemia, unspecified: Secondary | ICD-10-CM

## 2014-07-05 MED ORDER — ATORVASTATIN CALCIUM 20 MG PO TABS: 20.0000 mg | ORAL_TABLET | Freq: Every day | ORAL | Status: DC

## 2014-07-05 NOTE — Assessment & Plan Note (Signed)
He has been on Zetia for many years. His LDL one year ago was 67. The guidelines clearly recommend using statins in patients with coronary disease regardless of their LDL. I've explained this to the patient. Our plan Prentis be to start atorvastatin and stop his Zetia when it runs out.

## 2014-07-05 NOTE — Assessment & Plan Note (Addendum)
Coronary disease is stable. He is not in any further evaluation at this point.

## 2014-07-05 NOTE — Progress Notes (Signed)
Patient ID: Craig Moore, male   DOB: 1941/06/09, 73 y.o.   MRN: [id number]    HPI  Patient is seen to followup coronary artery disease. He received a bare-metal stent in 2006. Nuclear study in 2012 revealed no ischemia. He has good LV function. 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 He's not having any significant problems. In reviewing his medications, I see that he has been on Zetia for many years. In talking with him there is no documentation of problems with a statin in the past. His LDL had been in the range of 67. This is probably why we did not push to use a statin in the past.  No Known Allergies  Current Outpatient Prescriptions  Medication Sig Dispense Refill  . aspirin EC 81 MG tablet Take 1 tablet (81 mg total) by mouth daily.  30 tablet  11  . ezetimibe (ZETIA) 10 MG tablet TAKE 1 TABLET (10 MG TOTAL) BY MOUTH DAILY.  90 tablet  3  . furosemide (LASIX) 20 MG tablet TAKE 1 TABLET BY MOUTH ONCE DAILY  90 tablet  3  . lisinopril (PRINIVIL,ZESTRIL) 10 MG tablet Take 1 tablet (10 mg total) by mouth daily.  30 tablet  0  . metoprolol succinate (TOPROL-XL) 50 MG 24 hr tablet TAKE 1 TABLET BY MOUTH ONCE DAILY  90 tablet  3  . rivaroxaban (XARELTO) 20 MG TABS tablet Take 1 tablet (20 mg total) by mouth daily with breakfast.  30 tablet  11   No current facility-administered medications for this visit.    History   Social History  . Marital Status: Legally Separated    Spouse Name: N/A    Number of Children: N/A  . Years of Education: N/A   Occupational History  . Not on file.   Social History Main Topics  . Smoking status: Current Every Day Smoker -- 0.25 packs/day    Types: Cigarettes  . Smokeless tobacco: Not on file  . Alcohol Use: 4.2 oz/week    7 Glasses of wine per week  . Drug Use: No  . Sexual Activity: Not Currently   Other Topics Concern  . Not on file   Social History Narrative  . No narrative on file    Family History  Problem  Relation Age of Onset  . Cancer Mother   . Heart disease Father   . Diabetes Father   . Stroke Father   . Cancer Sister     breast    Past Medical History  Diagnosis Date  . Hyperlipidemia   . Hypertension   . Myocardial infarction   . CAD (coronary artery disease)     Non-STEMI May, 2006, bare-metal stent  //   nuclear, January, 2012, EF 70%, no ischemia  . Ejection fraction     EF 55%, echo, October, 2006, technically difficult  //  EF 70%, nuclear, January, 2012  . Tobacco abuse   . Pre-syncope     ER, January, 2011, dehydration  . DVT (deep venous thrombosis)     December, 2011, Coumadin therapy  . Groin pain     June, 2013  . CHF (congestive heart failure)     Past Surgical History  Procedure Laterality Date  . Coronary angioplasty  2006    Patient Active Problem List   Diagnosis Date Noted  . Umbilical hernia 69/48/5462  . Osteoarthritis of both hips 06/01/2012  . CAD (coronary artery disease)   . DVT (deep venous thrombosis)   .  DYSLIPIDEMIA 02/23/2010  . TOBACCO ABUSE 02/23/2010  . Hypertension, well controlled 02/21/2010    ROS   Patient denies fever, chills, headache, sweats, rash, change in vision, change in hearing, chest pain, cough, nausea vomiting, urinary symptoms. All other systems are reviewed and are negative.  PHYSICAL EXAM  Patient is oriented to person time and place. Affect is normal. There is no jugulovenous distention. Lungs reveal marked decreased breath sounds.Marland Kitchen Respiratory effort is nonlabored. The patient has poor dentition. Head is atraumatic.  Cardiac exam reveals an S1 and S2. The abdomen is soft. There is no peripheral edema.  Filed Vitals:   07/05/14 1556  BP: 142/82  Pulse: 72  Height: 5\' 11"  (1.803 m)  Weight: 152 lb 12.8 oz (69.31 kg)   EKG is done today and reviewed by me. There is decreased anterior R wave progression. There sinus rhythm. There is no significant change from the past. ASSESSMENT & PLAN

## 2014-07-05 NOTE — Patient Instructions (Addendum)
Your physician has recommended you make the following change in your medication: when you finish your current bottle of Zetia switch to Atorvastatin 20 mg at bedtime  Your physician wants you to follow-up in: 1 year. [name] receive a reminder letter in the mail two months in advance. If you don't receive a letter, please call our office to schedule the follow-up appointment.

## 2014-08-14 ENCOUNTER — Encounter (HOSPITAL_COMMUNITY): Payer: Self-pay | Admitting: Emergency Medicine

## 2014-08-14 ENCOUNTER — Emergency Department (HOSPITAL_COMMUNITY): Payer: Medicare Other

## 2014-08-14 ENCOUNTER — Emergency Department (HOSPITAL_COMMUNITY)
Admission: EM | Admit: 2014-08-14 | Discharge: 2014-08-14 | Disposition: A | Discharge status: Home / Self Care | Payer: Medicare Other | Attending: Emergency Medicine | Admitting: Emergency Medicine

## 2014-08-14 DIAGNOSIS — Z7901 Long term (current) use of anticoagulants: Secondary | ICD-10-CM | POA: Insufficient documentation

## 2014-08-14 DIAGNOSIS — I252 Old myocardial infarction: Secondary | ICD-10-CM | POA: Diagnosis not present

## 2014-08-14 DIAGNOSIS — E871 Hypo-osmolality and hyponatremia: Secondary | ICD-10-CM

## 2014-08-14 DIAGNOSIS — E785 Hyperlipidemia, unspecified: Secondary | ICD-10-CM | POA: Diagnosis not present

## 2014-08-14 DIAGNOSIS — R51 Headache: Secondary | ICD-10-CM | POA: Insufficient documentation

## 2014-08-14 DIAGNOSIS — R079 Chest pain, unspecified: Secondary | ICD-10-CM | POA: Diagnosis present

## 2014-08-14 DIAGNOSIS — F17200 Nicotine dependence, unspecified, uncomplicated: Secondary | ICD-10-CM | POA: Diagnosis not present

## 2014-08-14 DIAGNOSIS — I509 Heart failure, unspecified: Secondary | ICD-10-CM | POA: Diagnosis not present

## 2014-08-14 DIAGNOSIS — Z7982 Long term (current) use of aspirin: Secondary | ICD-10-CM | POA: Insufficient documentation

## 2014-08-14 DIAGNOSIS — I10 Essential (primary) hypertension: Secondary | ICD-10-CM | POA: Insufficient documentation

## 2014-08-14 DIAGNOSIS — Z79899 Other long term (current) drug therapy: Secondary | ICD-10-CM | POA: Diagnosis not present

## 2014-08-14 DIAGNOSIS — I2510 Atherosclerotic heart disease of native coronary artery without angina pectoris: Secondary | ICD-10-CM | POA: Diagnosis not present

## 2014-08-14 DIAGNOSIS — Z86718 Personal history of other venous thrombosis and embolism: Secondary | ICD-10-CM | POA: Diagnosis not present

## 2014-08-14 DIAGNOSIS — Z9861 Coronary angioplasty status: Secondary | ICD-10-CM | POA: Diagnosis not present

## 2014-08-14 DIAGNOSIS — R519 Headache, unspecified: Secondary | ICD-10-CM

## 2014-08-14 LAB — BASIC METABOLIC PANEL
Anion gap: 14 (ref 5–15)
BUN: 11 mg/dL (ref 6–23)
CO2: 23 mEq/L (ref 19–32)
CREATININE: 1.75 mg/dL — AB (ref 0.50–1.35)
Calcium: 8 mg/dL — ABNORMAL LOW (ref 8.4–10.5)
Chloride: 91 mEq/L — ABNORMAL LOW (ref 96–112)
GFR, EST AFRICAN AMERICAN: 43 mL/min — AB (ref >90)
GFR, EST NON AFRICAN AMERICAN: 37 mL/min — AB (ref >90)
Glucose, Bld: 104 mg/dL — ABNORMAL HIGH (ref 70–99)
Potassium: 5 mEq/L (ref 3.7–5.3)
Sodium: 128 mEq/L — ABNORMAL LOW (ref 137–147)

## 2014-08-14 LAB — CBC
HEMATOCRIT: 42.1 % (ref 39.0–52.0)
Hemoglobin: 15.4 g/dL (ref 13.0–17.0)
MCH: 42.4 pg — AB (ref 26.0–34.0)
MCHC: 36.6 g/dL — ABNORMAL HIGH (ref 30.0–36.0)
MCV: 116 fL — AB (ref 78.0–100.0)
PLATELETS: 167 10*3/uL (ref 150–400)
RBC: 3.63 MIL/uL — ABNORMAL LOW (ref 4.22–5.81)
RDW: 11.5 % (ref 11.5–15.5)
WBC: 4.1 10*3/uL (ref 4.0–10.5)

## 2014-08-14 LAB — PRO B NATRIURETIC PEPTIDE: Pro B Natriuretic peptide (BNP): 434.3 pg/mL — ABNORMAL HIGH (ref 0–125)

## 2014-08-14 LAB — I-STAT TROPONIN, ED: Troponin i, poc: 0 ng/mL (ref 0.00–0.08)

## 2014-08-14 NOTE — Progress Notes (Signed)
Contacted by ED concerning Mr. Sellitto.  PA states they are planning on stopping his lasix at this time due to hyponatremia (128).  They wish for him to follow up at his [facility] office for follow up tomorrow to recheck BMP.  He was instructed to call the [facility] tomorrow morning and ask for an appointment.  Dr. Gerlean Ren, DO PGY-1 08/14/14 7:37 PM

## 2014-08-14 NOTE — ED Notes (Signed)
Pt reports for last three days he has awakened with a "fluttering" in his left chest. States by afternoon it resolves.. Pt reports SOB, dizziness and lightheadedness with sensation. Pt denies "fluttering" feeling at this time. Pt in NAD. AO x4.

## 2014-08-14 NOTE — ED Provider Notes (Signed)
CSN: [id number]     Arrival date & time 08/14/14  1237 History   First MD Initiated Contact with Patient 08/14/14 1743     Chief Complaint  Patient presents with  . Chest Pain     (Consider location/radiation/quality/duration/timing/severity/associated sxs/prior Treatment) Patient is a 73 y.o. male presenting with headaches. The history is provided by the patient. No language interpreter was used.  Headache Pain location:  Frontal Quality:  Dull (States it is a pressure not pain.) Associated symptoms: no abdominal pain, no cough, no fever, no nausea and no vomiting   Associated symptoms comment:  He reports concern for a pressure like sensation to bilateral forehead that has been on and off for the past 2-3 days. Today he experienced mild lightheadedness with his symptoms. No syncope or near syncope. No chest pain, SOB, nausea. He is adamant that he does not have a headache. No visual changes, congestion, sore throat, or cough.   Past Medical History  Diagnosis Date  . Hyperlipidemia   . Hypertension   . Myocardial infarction   . CAD (coronary artery disease)     Non-STEMI May, 2006, bare-metal stent  //   nuclear, January, 2012, EF 70%, no ischemia  . Ejection fraction     EF 55%, echo, October, 2006, technically difficult  //  EF 70%, nuclear, January, 2012  . Tobacco abuse   . Pre-syncope     ER, January, 2011, dehydration  . DVT (deep venous thrombosis)     December, 2011, Coumadin therapy  . Groin pain     June, 2013  . CHF (congestive heart failure)    Past Surgical History  Procedure Laterality Date  . Coronary angioplasty  2006   Family History  Problem Relation Age of Onset  . Cancer Mother   . Heart disease Father   . Diabetes Father   . Stroke Father   . Cancer Sister     breast   History  Substance Use Topics  . Smoking status: Current Every Day Smoker -- 0.25 packs/day    Types: Cigarettes  . Smokeless tobacco: Not on file  . Alcohol Use: 4.2 oz/week     7 Glasses of wine per week    Review of Systems  Constitutional: Negative for fever and chills.  Respiratory: Negative.  Negative for cough and shortness of breath.   Cardiovascular: Negative for chest pain and leg swelling.  Gastrointestinal: Negative.  Negative for nausea, vomiting and abdominal pain.  Musculoskeletal: Negative.   Skin: Negative.  Negative for color change and rash.  Neurological: Positive for headaches.       See HPI.      Allergies  Review of patient's allergies indicates no known allergies.  Home Medications   Prior to Admission medications   Medication Sig Start Date End Date Taking? Authorizing Provider  aspirin EC 81 MG tablet Take 81 mg by mouth daily. 03/23/14  Yes Cordelia Poche, MD  atorvastatin (LIPITOR) 20 MG tablet Take 20 mg by mouth daily.   Yes Historical Provider, MD  furosemide (LASIX) 20 MG tablet Take 20 mg by mouth.   Yes Historical Provider, MD  lisinopril (PRINIVIL,ZESTRIL) 10 MG tablet Take 10 mg by mouth daily.   Yes Historical Provider, MD  metoprolol succinate (TOPROL-XL) 50 MG 24 hr tablet Take 50 mg by mouth daily. Take with or immediately following a meal.   Yes Historical Provider, MD  rivaroxaban (XARELTO) 20 MG TABS tablet Take 20 mg by mouth daily with supper.  Yes Historical Provider, MD   BP 173/88  Pulse 65  Temp(Src) 98.2 F (36.8 C) (Oral)  Resp 16  SpO2 100% Physical Exam  Constitutional: He is oriented to person, place, and time. He appears well-developed and well-nourished.  HENT:  Head: Normocephalic and atraumatic.  Eyes: EOM are normal. Pupils are equal, round, and reactive to light.  Neck: Normal range of motion. Neck supple.  Cardiovascular: Normal rate and regular rhythm.   No murmur heard. No carotid bruit.  Pulmonary/Chest: Effort normal and breath sounds normal. He has no wheezes. He has no rales.  Abdominal: Soft. There is no tenderness.  Musculoskeletal: Normal range of motion. He exhibits no  edema.  Neurological: He is alert and oriented to person, place, and time. He has normal strength and normal reflexes. No sensory deficit. He displays a negative Romberg sign. Coordination normal.  CN's 3-12 grossly intact. He is ambulatory without ataxia. Coordination intact, without deficit. Speech clear and focused.   Skin: Skin is warm and dry.  Psychiatric: He has a normal mood and affect.    ED Course  Procedures (including critical care time) Labs Review Labs Reviewed  CBC - Abnormal; Notable for the following:    RBC 3.63 (*)    MCV 116.0 (*)    MCH 42.4 (*)    MCHC 36.6 (*)    All other components within normal limits  BASIC METABOLIC PANEL - Abnormal; Notable for the following:    Sodium 128 (*)    Chloride 91 (*)    Glucose, Bld 104 (*)    Creatinine, Ser 1.75 (*)    Calcium 8.0 (*)    GFR calc non Af Amer 37 (*)    GFR calc Af Amer 43 (*)    All other components within normal limits  PRO B NATRIURETIC PEPTIDE - Abnormal; Notable for the following:    Pro B Natriuretic peptide (BNP) 434.3 (*)    All other components within normal limits  I-STAT TROPOININ, ED    Imaging Review Dg Chest 2 View  08/14/2014   CLINICAL DATA:  Hypertension. Chest pain. Shortness of breath and dizziness.  EXAM: CHEST  2 VIEW  COMPARISON:  12/16/2010  FINDINGS: Midline trachea. Chin overlies the apices. Mild cardiomegaly with atherosclerosis in the transverse aorta. The descending aorta is tortuous. No pleural effusion or pneumothorax. EKG lead artifact project over the right upper and left lower lobes. Clear lungs.  IMPRESSION: Mild cardiomegaly and atherosclerosis.  No acute findings.   Electronically Signed   By: Abigail Miyamoto M.D.   On: 08/14/2014 13:36     EKG Interpretation None      MDM   Final diagnoses:  None    1. Hyponatremia 2. Headache  The patient has a normal neurologic exam. He appears well and is currently asymptomatic. He has been ambulatory in the department  without difficulty or recurrent symtpoms.   Doubt IC bleed, stroke or intracranial infection as cause of headache. Concern for hyponatremia at 128. Discussed with Dr. Johnney Killian who advises patient to stop Lasix until rechecked with PCP. Patient states he was told by PCP already to stop medication but he has continued. Also advised the patient to have his potassium rechecked this week as well.     Dewaine Oats, PA-C 08/15/14 [phone]

## 2014-08-14 NOTE — ED Notes (Addendum)
Patient to ED with C/O tightness in his head and fluttering in his chest. States that he was at the store and began feeling dizzy and lightheaded so he cam to the ED.

## 2014-08-14 NOTE — Discharge Instructions (Signed)
STOP TAKING YOUR LASIX. YOU Json NEED TO HAVE YOUR SODIUM LEVEL RECHECKED IN THE OFFICE. CALL FAMILY PRACTICE TOMORROW TO SCHEDULE AN APPOINTMENT TO HAVE THAT RECHECKED AND TO DISCUSS YOUR POTASSIUM LEVEL AS WELL AS TO HAVE YOUR CURRENT SYMPTOMS RECHECKED.

## 2014-08-20 ENCOUNTER — Ambulatory Visit (INDEPENDENT_AMBULATORY_CARE_PROVIDER_SITE_OTHER): Payer: Medicare Other | Admitting: Family Medicine

## 2014-08-20 ENCOUNTER — Encounter: Payer: Self-pay | Admitting: Family Medicine

## 2014-08-20 VITALS — BP 142/71 | HR 72 | Temp 98.4°F | Ht 71.0 in | Wt 153.0 lb

## 2014-08-20 DIAGNOSIS — I10 Essential (primary) hypertension: Secondary | ICD-10-CM

## 2014-08-20 DIAGNOSIS — E871 Hypo-osmolality and hyponatremia: Secondary | ICD-10-CM | POA: Insufficient documentation

## 2014-08-20 DIAGNOSIS — I2510 Atherosclerotic heart disease of native coronary artery without angina pectoris: Secondary | ICD-10-CM

## 2014-08-20 LAB — BASIC METABOLIC PANEL
BUN: 9 mg/dL (ref 6–23)
CO2: 27 meq/L (ref 19–32)
CREATININE: 1.34 mg/dL (ref 0.50–1.35)
Calcium: 9 mg/dL (ref 8.4–10.5)
Chloride: 97 mEq/L (ref 96–112)
GLUCOSE: 91 mg/dL (ref 70–99)
Potassium: 6.4 mEq/L (ref 3.5–5.3)
Sodium: 128 mEq/L — ABNORMAL LOW (ref 135–145)

## 2014-08-20 NOTE — Patient Instructions (Signed)
Thank you for coming to see me today. It was a pleasure. Today we talked about:   Recent Emergency Department visit: I'm glad you are not having those symptoms anymore. Your electrolytes were a little off recently. I am going to recheck them today. Someone Canden call you with the results, and per your request, I Tharon have them mailed to you. We talked about things to look out for, including: shortness of breath, chest pain, fevers, chills, pain with breathing, coughing up a lot of mucous. If any of those things happens, please come back to be seen.  Please make an appointment to see me in 3 months for follow-up.  If you have any questions or concerns, please do not hesitate to call the office at [phone].  Sincerely,  Cordelia Poche, MD

## 2014-08-20 NOTE — Assessment & Plan Note (Signed)
Stable.  Continue statin and aspirin. 

## 2014-08-20 NOTE — Assessment & Plan Note (Signed)
Denim repeat Bmet today. Patient not taking furosemide anymore.

## 2014-08-20 NOTE — Assessment & Plan Note (Signed)
Patient's blood pressure is controlled. Continue current regimen

## 2014-08-20 NOTE — ED Provider Notes (Signed)
Medical screening examination/treatment/procedure(s) were performed by non-physician practitioner and as supervising physician I was immediately available for consultation/collaboration.   EKG Interpretation   Date/Time:  Tuesday August 14 2014 12:45:26 EDT Ventricular Rate:  72 PR Interval:  216 QRS Duration: 102 QT Interval:  394 QTC Calculation: 431 R Axis:   -30 Text Interpretation:  Sinus rhythm with 1st degree A-V block Left axis  deviation Incomplete right bundle branch block Septal infarct , age  undetermined Abnormal ECG ED PHYSICIAN INTERPRETATION AVAILABLE IN [facility] Confirmed by TEST, Record ([id number]) on 08/16/2014 7:20:57 AM       Charlesetta Shanks, MD 08/20/14 1743

## 2014-08-20 NOTE — Progress Notes (Signed)
    Subjective    Craig Moore is a 73 y.o. male that presents for an office visit.   1. ED follow-up: Patient was seen at the ED for chest pain and shortness of breath. He was found to have hyponatremia and increased potassium. He is not having chest pain, headaches , shortness of breath or leg swelling. He is not taking furosemide. 2. Hypertension: Adherent to medication regimen. 3. CAD: adherent to medication regimen. Recently switched to atorvastatin from Zetia.  History  Substance Use Topics  . Smoking status: Current Every Day Smoker -- 0.25 packs/day    Types: Cigarettes  . Smokeless tobacco: Not on file  . Alcohol Use: 4.2 oz/week    7 Glasses of wine per week    No Known Allergies  No orders of the defined types were placed in this encounter.    ROS  Per HPI   Objective   BP 142/71  Pulse 72  Temp(Src) 98.4 F (36.9 C) (Oral)  Ht 5\' 11"  (1.803 m)  Wt 153 lb (69.4 kg)  BMI 21.35 kg/m2  General: Well appearing, in no acute distress Respiratory/Chest: Clear to auscultation with decreased breath sounds most likely due to poor effort. Cardiovascular: Regular rate and rhythm Musculoskeletal: No leg edema  Assessment and Plan   Please refer to problem based charting of assessment and plan

## 2014-08-21 ENCOUNTER — Telehealth: Payer: Self-pay | Admitting: Family Medicine

## 2014-08-21 ENCOUNTER — Ambulatory Visit (HOSPITAL_COMMUNITY)
Admission: RE | Admit: 2014-08-21 | Discharge: 2014-08-21 | Disposition: A | Discharge status: Home / Self Care | Payer: Medicare Other | Source: Ambulatory Visit | Attending: Family Medicine | Admitting: Family Medicine

## 2014-08-21 ENCOUNTER — Other Ambulatory Visit: Payer: Medicare Other

## 2014-08-21 ENCOUNTER — Ambulatory Visit (INDEPENDENT_AMBULATORY_CARE_PROVIDER_SITE_OTHER): Payer: Medicare Other | Admitting: Family Medicine

## 2014-08-21 ENCOUNTER — Encounter: Payer: Self-pay | Admitting: Family Medicine

## 2014-08-21 VITALS — BP 136/70 | HR 65 | Temp 98.4°F | Wt 149.0 lb

## 2014-08-21 DIAGNOSIS — R0789 Other chest pain: Secondary | ICD-10-CM | POA: Diagnosis present

## 2014-08-21 DIAGNOSIS — E875 Hyperkalemia: Secondary | ICD-10-CM

## 2014-08-21 DIAGNOSIS — I10 Essential (primary) hypertension: Secondary | ICD-10-CM

## 2014-08-21 DIAGNOSIS — E871 Hypo-osmolality and hyponatremia: Secondary | ICD-10-CM

## 2014-08-21 LAB — CBC
HCT: 43.8 % (ref 39.0–52.0)
Hemoglobin: 16 g/dL (ref 13.0–17.0)
MCH: 43.7 pg — AB (ref 26.0–34.0)
MCHC: 36.5 g/dL — ABNORMAL HIGH (ref 30.0–36.0)
MCV: 119.7 fL — ABNORMAL HIGH (ref 78.0–100.0)
Platelets: 206 10*3/uL (ref 150–400)
RBC: 3.66 MIL/uL — AB (ref 4.22–5.81)
RDW: 11.9 % (ref 11.5–15.5)
WBC: 3.8 10*3/uL — ABNORMAL LOW (ref 4.0–10.5)

## 2014-08-21 LAB — BASIC METABOLIC PANEL
BUN: 9 mg/dL (ref 6–23)
CALCIUM: 8.8 mg/dL (ref 8.4–10.5)
CHLORIDE: 99 meq/L (ref 96–112)
CO2: 26 mEq/L (ref 19–32)
Creat: 1.31 mg/dL (ref 0.50–1.35)
Glucose, Bld: 94 mg/dL (ref 70–99)
Potassium: 4.9 mEq/L (ref 3.5–5.3)
Sodium: 137 mEq/L (ref 135–145)

## 2014-08-21 NOTE — Progress Notes (Signed)
Patient ID: Warner Laduca, male   DOB: [birth year], 73 y.o.   MRN: [id number]  Tommi Rumps, MD Phone: [phone]  Bairon Ellery is a 73 y.o. male who presents today for same day appointment.   Patient presents for repeat lab work and EKG in the setting of hyperkalemia discovered on lab work obtained yesterday in clinic. The patients potassium came back at 6.4 and per the lab report the specimen was hemolyzed. Patient reports no complaints at this time. He notes he did have a mild tightness near his left axilla this morning when he received the call regarding the lab results. He notes this lasted less than 30 seconds, had no dyspnea, diaphoresis, or radiation with this. He notes this tightness has occurred in the past when he is anxious. He is followed by Dr Ron Parker and last saw him in July. Patient reports this pain occurs infrequently and only when he is anxious. It is not exertional. He has no palpitations or weakness. He was taken off lasix one week ago for hyponatremia.   Patient is a smoker.   ROS: Per HPI   Physical Exam Filed Vitals:   08/21/14 1405  BP: 136/70  Pulse: 65  Temp: 98.4 F (36.9 C)    Gen: Well NAD HEENT: PERRL,  MMM Lungs: CTABL Nl WOB Heart: RRR no MRG Exts: Non edematous BL  LE, warm and well perfused.   EKG: NSR, rate 66, no ST or T wave abnormalities noted, no QRS prolongation  Assessment/Plan: Please see individual problem list.  Tommi Rumps, MD [place] PGY-3

## 2014-08-21 NOTE — Patient Instructions (Signed)
Nice to meet you. Your potassium was elevated yesterday. We are rechecking this today. We Craig Moore call you with the results.  If your potassium is still elevated we may advise you to go to the emergency room. If you develop palpitations, chest pain, lightheadedness/dizziness, weakness or any new symptoms please go to the emergency room.

## 2014-08-21 NOTE — Assessment & Plan Note (Addendum)
Patient with brief atypical chest discomfort occuring this morning during a period of anxiety regarding his lab results. He has no acute changes on EKG. I do not believe this is cardiac in nature given it is atypical and there are no acute EKG changes and most likely is related to anxiety. I discussed return precautions with the patient. F/u in 2 weeks with PCP.  Precepted with Dr Ree Kida.

## 2014-08-21 NOTE — Assessment & Plan Note (Addendum)
Hyponatremia and hyperkalemia resolved on lab testing today. Called patient to inform him of the results. He is to continue to hold his lasix. He can continue his other medications at this time. He is to follow-up with his PCP in 2 weeks.

## 2014-08-21 NOTE — Telephone Encounter (Signed)
Informed patient of elevated serum potassium and low sodium Pt reports feeling well HPI: Furosemide stopped in ED because of hyponatremia on 08/14/14. Pt had gone to ED for chief complaint of pressure sensation of forehead.  A/  1. Hyperkalemia  Lab Results  Component Value Date   CREATININE 1.34 (1.75 on 08/14/14) 08/20/2014   BUN 9 08/20/2014   NA 128* (128 on 08/14/14) 08/20/2014   K 6.4*   (5.0 on 9.8.15) 08/20/2014   CL 97 08/20/2014   CO2 27 08/20/2014   - Prior serum K+ 5.0 on 08/14/14;  4.9 on 03/27/15; 4.2 on 10/17/13. 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    Metoprolol-related   Chronic Kidney Disease   Multifactorial  Recommendations - Patient advised to come in for SDA to have STAT re-testing serum electrolytes and EKG. - Consider holding ACEI, metoprolol, -  Pt advised to call EMS if develops palpitations, chest pain, lightheadedness/dizziness.  Pt agreed.   Other lab finding of note is persistent Macrocytic RBCs.

## 2014-08-28 ENCOUNTER — Other Ambulatory Visit: Payer: Self-pay | Admitting: Family Medicine

## 2014-08-28 ENCOUNTER — Other Ambulatory Visit: Payer: Medicare Other

## 2014-08-28 DIAGNOSIS — D7589 Other specified diseases of blood and blood-forming organs: Secondary | ICD-10-CM

## 2014-08-28 DIAGNOSIS — E871 Hypo-osmolality and hyponatremia: Secondary | ICD-10-CM

## 2014-08-28 DIAGNOSIS — K921 Melena: Secondary | ICD-10-CM

## 2014-08-28 LAB — BASIC METABOLIC PANEL
BUN: 16 mg/dL (ref 6–23)
CALCIUM: 8.8 mg/dL (ref 8.4–10.5)
CO2: 23 mEq/L (ref 19–32)
Chloride: 99 mEq/L (ref 96–112)
Creat: 1.28 mg/dL (ref 0.50–1.35)
Glucose, Bld: 113 mg/dL — ABNORMAL HIGH (ref 70–99)
POTASSIUM: 4.9 meq/L (ref 3.5–5.3)
SODIUM: 136 meq/L (ref 135–145)

## 2014-08-28 LAB — CBC WITH DIFFERENTIAL/PLATELET
Basophils Absolute: 0 10*3/uL (ref 0.0–0.1)
Basophils Relative: 0 % (ref 0–1)
Eosinophils Absolute: 0.1 10*3/uL (ref 0.0–0.7)
Eosinophils Relative: 2 % (ref 0–5)
HEMATOCRIT: 42.1 % (ref 39.0–52.0)
Hemoglobin: 15.3 g/dL (ref 13.0–17.0)
LYMPHS PCT: 28 % (ref 12–46)
Lymphs Abs: 1.3 10*3/uL (ref 0.7–4.0)
MCH: 43 pg — ABNORMAL HIGH (ref 26.0–34.0)
MCHC: 36.3 g/dL — AB (ref 30.0–36.0)
MCV: 118.3 fL — ABNORMAL HIGH (ref 78.0–100.0)
MONO ABS: 0.4 10*3/uL (ref 0.1–1.0)
MONOS PCT: 8 % (ref 3–12)
NEUTROS ABS: 3 10*3/uL (ref 1.7–7.7)
Neutrophils Relative %: 62 % (ref 43–77)
Platelets: 195 10*3/uL (ref 150–400)
RBC: 3.56 MIL/uL — ABNORMAL LOW (ref 4.22–5.81)
RDW: 11.3 % — ABNORMAL LOW (ref 11.5–15.5)
WBC: 4.8 10*3/uL (ref 4.0–10.5)

## 2014-08-28 LAB — VITAMIN B12: VITAMIN B 12: 340 pg/mL (ref 211–911)

## 2014-08-28 LAB — FOLATE: Folate: 6.3 ng/mL

## 2014-08-28 NOTE — Progress Notes (Signed)
Per DR. NETTEY ADDED B12 AND FOLATE TO STAT LABS  Craig Moore

## 2014-08-28 NOTE — Addendum Note (Signed)
Addended by: Lianne Bushy on: 08/28/2014 12:34 PM   Modules accepted: Orders

## 2014-08-28 NOTE — Progress Notes (Signed)
STAT BMP AND CBC WITH DIFF DONE TODAY Craig Moore

## 2014-08-28 NOTE — Progress Notes (Signed)
Patient walked in and was seen by. [facility] with complaint of loose stools having had 3 bowel movements today. Reports all bowel movements are extremely black and tarry looking. He is worried because he's on blood thinner. He seems to be asymptomatic as in no dizziness, no chest pain, no lightheadedness. We Craig Moore get a stat hemoglobin and fecal occult blood card and I Dyshon call him with results

## 2014-09-17 ENCOUNTER — Other Ambulatory Visit: Payer: Self-pay | Admitting: Cardiology

## 2014-09-18 ENCOUNTER — Other Ambulatory Visit: Payer: Self-pay

## 2014-09-18 MED ORDER — METOPROLOL SUCCINATE ER 50 MG PO TB24: 50.0000 mg | ORAL_TABLET | Freq: Every day | ORAL | Status: DC

## 2014-10-19 ENCOUNTER — Other Ambulatory Visit: Payer: Self-pay | Admitting: *Deleted

## 2014-10-21 MED ORDER — LISINOPRIL 10 MG PO TABS: 10.0000 mg | ORAL_TABLET | Freq: Every day | ORAL | Status: DC

## 2014-10-22 NOTE — Telephone Encounter (Signed)
spokw with patient and informed him that rx has been sent in with 5 refills by Dr. Lonny Prude

## 2015-01-14 ENCOUNTER — Encounter: Payer: Self-pay | Admitting: *Deleted

## 2015-01-14 NOTE — Progress Notes (Signed)
Pt brought in letter from [facility] stating he [name] need a prior authorization completed for Xarelto.  [employer] called; PA completed over the phone.  PA approved through 01/12/2016.  Reference number: [id number].  Derl Barrow, RN

## 2015-02-07 ENCOUNTER — Other Ambulatory Visit: Payer: Self-pay | Admitting: *Deleted

## 2015-02-07 ENCOUNTER — Other Ambulatory Visit: Payer: Self-pay

## 2015-02-07 MED ORDER — METOPROLOL SUCCINATE ER 50 MG PO TB24: 50.0000 mg | ORAL_TABLET | Freq: Every day | ORAL | Status: DC

## 2015-02-07 MED ORDER — ATORVASTATIN CALCIUM 20 MG PO TABS: 20.0000 mg | ORAL_TABLET | Freq: Every day | ORAL | Status: DC

## 2015-02-07 MED ORDER — LISINOPRIL 10 MG PO TABS: 10.0000 mg | ORAL_TABLET | Freq: Every day | ORAL | Status: DC

## 2015-02-11 ENCOUNTER — Other Ambulatory Visit: Payer: Self-pay | Admitting: *Deleted

## 2015-02-11 MED ORDER — RIVAROXABAN 20 MG PO TABS: 20.0000 mg | ORAL_TABLET | Freq: Every day | ORAL | Status: DC

## 2015-02-11 NOTE — Telephone Encounter (Signed)
Patient requesting 90-day supply of xarelto.

## 2015-02-12 MED ORDER — RIVAROXABAN 20 MG PO TABS: 20.0000 mg | ORAL_TABLET | Freq: Every day | ORAL | Status: DC

## 2015-02-12 NOTE — Addendum Note (Signed)
Addended by: Cordelia Poche A on: 02/12/2015 06:49 PM   Modules accepted: Orders

## 2015-02-13 NOTE — Telephone Encounter (Signed)
Spoke with patient and informed him of below 

## 2015-02-13 NOTE — Telephone Encounter (Signed)
LVM for patient tot call back to inform him of below.

## 2015-02-14 ENCOUNTER — Telehealth: Payer: Self-pay | Admitting: Family Medicine

## 2015-02-14 MED ORDER — LISINOPRIL 10 MG PO TABS
10.0000 mg | ORAL_TABLET | Freq: Every day | ORAL | Status: DC
Start: 2015-02-14 — End: 2015-11-04

## 2015-02-14 NOTE — Telephone Encounter (Signed)
Patient's lisinopril was sent for only 30 tabs.  Wanted 90 day supply.  Asking provider to resend for 90 day and call patient to let him know when changed.  Already made 3 trips to pharmacy today.

## 2015-02-14 NOTE — Telephone Encounter (Signed)
Message left for 90 day supply of Xarelto, not lisinopril. [facility] resend prescription for 90 day supply of Lisinopril.

## 2015-02-15 NOTE — Telephone Encounter (Signed)
Patient informed. 

## 2015-08-06 ENCOUNTER — Other Ambulatory Visit: Payer: Self-pay | Admitting: Cardiology

## 2015-08-19 ENCOUNTER — Encounter: Payer: Self-pay | Admitting: Family Medicine

## 2015-08-19 ENCOUNTER — Ambulatory Visit (INDEPENDENT_AMBULATORY_CARE_PROVIDER_SITE_OTHER): Payer: Medicare Other | Admitting: Family Medicine

## 2015-08-19 ENCOUNTER — Other Ambulatory Visit: Payer: Self-pay | Admitting: Family Medicine

## 2015-08-19 VITALS — BP 148/84 | HR 72 | Temp 98.3°F | Ht 71.0 in | Wt 156.7 lb

## 2015-08-19 DIAGNOSIS — R739 Hyperglycemia, unspecified: Secondary | ICD-10-CM

## 2015-08-19 DIAGNOSIS — Z1211 Encounter for screening for malignant neoplasm of colon: Secondary | ICD-10-CM

## 2015-08-19 DIAGNOSIS — I10 Essential (primary) hypertension: Secondary | ICD-10-CM | POA: Diagnosis not present

## 2015-08-19 DIAGNOSIS — N529 Male erectile dysfunction, unspecified: Secondary | ICD-10-CM | POA: Insufficient documentation

## 2015-08-19 LAB — CBC WITH DIFFERENTIAL/PLATELET
BASOS ABS: 0 10*3/uL (ref 0.0–0.1)
Basophils Relative: 0 % (ref 0–1)
EOS ABS: 0.2 10*3/uL (ref 0.0–0.7)
EOS PCT: 4 % (ref 0–5)
HEMATOCRIT: 35.2 % — AB (ref 39.0–52.0)
Hemoglobin: 11.9 g/dL — ABNORMAL LOW (ref 13.0–17.0)
LYMPHS PCT: 22 % (ref 12–46)
Lymphs Abs: 1.2 10*3/uL (ref 0.7–4.0)
MCH: 41.2 pg — ABNORMAL HIGH (ref 26.0–34.0)
MCHC: 33.8 g/dL (ref 30.0–36.0)
MCV: 121.8 fL — ABNORMAL HIGH (ref 78.0–100.0)
MPV: 10.2 fL (ref 8.6–12.4)
Monocytes Absolute: 0.5 10*3/uL (ref 0.1–1.0)
Monocytes Relative: 10 % (ref 3–12)
Neutro Abs: 3.4 10*3/uL (ref 1.7–7.7)
Neutrophils Relative %: 64 % (ref 43–77)
Platelets: 246 10*3/uL (ref 150–400)
RBC: 2.89 MIL/uL — ABNORMAL LOW (ref 4.22–5.81)
RDW: 14.4 % (ref 11.5–15.5)
WBC: 5.3 10*3/uL (ref 4.0–10.5)

## 2015-08-19 LAB — POCT GLYCOSYLATED HEMOGLOBIN (HGB A1C): Hemoglobin A1C: 4.7

## 2015-08-19 LAB — TSH: TSH: 1.084 u[IU]/mL (ref 0.350–4.500)

## 2015-08-19 MED ORDER — SILDENAFIL CITRATE 100 MG PO TABS: 50.0000 mg | ORAL_TABLET | Freq: Every day | ORAL | Status: AC | PRN

## 2015-08-19 NOTE — Progress Notes (Signed)
Subjective    Craig Moore is a 74 y.o. male that presents for yearly physical.   Concerns:  1. Sexual dysfunction: Patient reports issues producing a strong erection and also with maintaining an erection. He reports having a strong desire.  2. Swelling in legs: This is a chronic issue. Symptoms worse when he has been up all day and while sitting down. Symptoms improve in the morning after waking up. No calf tenderness  Past Medical History  Diagnosis Date  . Hyperlipidemia   . Hypertension   . Myocardial infarction   . CAD (coronary artery disease)     Non-STEMI May, 2006, bare-metal stent  //   nuclear, January, 2012, EF 70%, no ischemia  . Ejection fraction     EF 55%, echo, October, 2006, technically difficult  //  EF 70%, nuclear, January, 2012  . Tobacco abuse   . Pre-syncope     ER, January, 2011, dehydration  . DVT (deep venous thrombosis)     December, 2011, Coumadin therapy  . Groin pain     June, 2013  . CHF (congestive heart failure)     Past Surgical History  Procedure Laterality Date  . Coronary angioplasty  2006   Current Outpatient Prescriptions on File Prior to Visit  Medication Sig Dispense Refill  . aspirin EC 81 MG tablet Take 81 mg by mouth daily.    Marland Kitchen atorvastatin (LIPITOR) 20 MG tablet TAKE 1 TABLET BY MOUTH EVERY DAY 30 tablet 0  . lisinopril (PRINIVIL,ZESTRIL) 10 MG tablet Take 1 tablet (10 mg total) by mouth daily. 90 tablet 2  . metoprolol succinate (TOPROL-XL) 50 MG 24 hr tablet TAKE 1 TABLET BY MOUTH EVERY DAY WITH OR IMMEDIATELY FOLLOWING A MEAL 30 tablet 0  . rivaroxaban (XARELTO) 20 MG TABS tablet Take 1 tablet (20 mg total) by mouth daily with supper. 90 tablet 3   No current facility-administered medications on file prior to visit.    No Known Allergies  Social History   Social History  . Marital Status: Legally Separated    Spouse Name: N/A  . Number of Children: N/A  . Years of Education: N/A   Social History Main Topics    . Smoking status: Current Every Day Smoker -- 0.25 packs/day    Types: Cigarettes  . Smokeless tobacco: None  . Alcohol Use: 4.8 oz/week    8 Glasses of wine per week  . Drug Use: No  . Sexual Activity: Not Currently   Other Topics Concern  . None   Social History Narrative    Family History  Problem Relation Age of Onset  . Cancer Mother   . Heart disease Father   . Diabetes Father   . Stroke Father   . Cancer Sister     breast   Health Maintenance Due  Topic Date Due  . TETANUS/TDAP  01/19/1962  . COLONOSCOPY  01/19/1993  . PNA vac Low Risk Adult (2 of 2 - PPSV23) 03/24/2015    ROS  Per HPI   Objective   BP 148/84 mmHg  Pulse 72  Temp(Src) 98.3 F (36.8 C) (Oral)  Ht 5\' 11"  (1.803 m)  Wt 156 lb 11.2 oz (71.079 kg)  BMI 21.87 kg/m2  General: Well appearing, no distress HEENT: Left TM not visible secondary to cerumen, right TM normal, nares patent, oropharynx normal with poor dentition which includes many missing teeth and some broken teeth, no cervical adenopathy. PERRL Respiratory/Chest: Clear to auscultation with diminished breath  sounds bilaterally and poor effort. No respiratory distress Cardiovascular: Regular rate and rhythm, no murmur Gastrointestinal: Soft, non-tender, non-distended, could not feel organomegaly or splenomegaly. Genitourinary: Not examined    Musculoskeletal: Good tone Neuro: Alert, oriented, CN grossly intact, reflexes symmetrical Dermatologic: No obvious rashes Psychiatric: Full affect, no depressed mood  Meds ordered this encounter  Medications  . sildenafil (VIAGRA) 100 MG tablet    Sig: Take 0.5 tablets (50 mg total) by mouth daily as needed for erectile dysfunction.    Dispense:  10 tablet    Refill:  11    Assessment and Plan    #Healthcare Maintenance - CBC, CMP, Lipid panel, TSH, A1C - Hemoccult cards (patient declines colonoscopy)  #LE swelling - compression stockings while up and sitting - elevate while sitting  and lying down

## 2015-08-19 NOTE — Patient Instructions (Addendum)
Thank you for coming to see me todaSildenafil tablets (Viagra) What is this medicine? SILDENAFIL (sil DEN a fil) is used to treat erection problems in men. This medicine may be used for other purposes; ask your health care provider or pharmacist if you have questions. COMMON BRAND NAME(S): Viagra What should I tell my health care provider before I take this medicine? They need to know if you have any of these conditions: -bleeding disorders -eye or vision problems, including a rare inherited eye disease called retinitis pigmentosa -anatomical deformation of the penis, Peyronie's disease, or history of priapism (painful and prolonged erection) -heart disease, angina, a history of heart attack, irregular heart beats, or other heart problems -high or low blood pressure -history of blood diseases, like sickle cell anemia or leukemia -history of stomach bleeding -kidney disease -liver disease -stroke -an unusual or allergic reaction to sildenafil, other medicines, foods, dyes, or preservatives -pregnant or trying to get pregnant -breast-feeding How should I use this medicine? Take this medicine by mouth with a glass of water. Follow the directions on the prescription label. The dose is usually taken 1 hour before sexual activity. You should not take the dose more than once per day. Do not take your medicine more often than directed. Talk to your pediatrician regarding the use of this medicine in children. This medicine is not used in children for this condition. Overdosage: If you think you have taken too much of this medicine contact a poison control center or emergency room at once. NOTE: This medicine is only for you. Do not share this medicine with others. What if I miss a dose? This does not apply. Do not take double or extra doses. What may interact with this medicine? Do not take this medicine with any of the following medications: -cisapride -methscopolamine nitrate -nitrates like  amyl nitrite, isosorbide dinitrate, isosorbide mononitrate, nitroglycerin -nitroprusside -other medicines for erectile dysfunction like avanafil, tadalafil, vardenafil -other sildenafil products (Revatio) This medicine may also interact with the following medications: -certain drugs for high blood pressure -certain drugs for the treatment of HIV infection or AIDS -certain drugs used for fungal or yeast infections, like fluconazole, itraconazole, ketoconazole, and voriconazole -cimetidine -erythromycin -rifampin This list may not describe all possible interactions. Give your health care provider a list of all the medicines, herbs, non-prescription drugs, or dietary supplements you use. Also tell them if you smoke, drink alcohol, or use illegal drugs. Some items may interact with your medicine. What should I watch for while using this medicine? If you notice any changes in your vision while taking this drug, call your doctor or health care professional as soon as possible. Stop using this medicine and call your health care provider right away if you have a loss of sight in one or both eyes. Contact your doctor or health care professional right away if you have an erection that lasts longer than 4 hours or if it becomes painful. This may be a sign of a serious problem and must be treated right away to prevent permanent damage. If you experience symptoms of nausea, dizziness, chest pain or arm pain upon initiation of sexual activity after taking this medicine, you should refrain from further activity and call your doctor or health care professional as soon as possible. Do not drink alcohol to excess (examples, 5 glasses of wine or 5 shots of whiskey) when taking this medicine. When taken in excess, alcohol can increase your chances of getting a headache or getting dizzy, increasing your  heart rate or lowering your blood pressure. Using this medicine does not protect you or your partner against HIV  infection (the virus that causes AIDS) or other sexually transmitted diseases. What side effects may I notice from receiving this medicine? Side effects that you should report to your doctor or health care professional as soon as possible: -allergic reactions like skin rash, itching or hives, swelling of the face, lips, or tongue -breathing problems -changes in hearing -changes in vision -chest pain -fast, irregular heartbeat -prolonged or painful erection -seizures Side effects that usually do not require medical attention (report to your doctor or health care professional if they continue or are bothersome): -back pain -dizziness -flushing -headache -indigestion -muscle aches -nausea -stuffy or runny nose This list may not describe all possible side effects. Call your doctor for medical advice about side effects. You may report side effects to FDA at 1-800-FDA-1088. Where should I keep my medicine? Keep out of reach of children. Store at room temperature between 15 and 30 degrees C (59 and 86 degrees F). Throw away any unused medicine after the expiration date. NOTE: This sheet is a summary. It may not cover all possible information. If you have questions about this medicine, talk to your doctor, pharmacist, or health care provider.  2015, Elsevier/Gold Standard. (2012-11-23 12:43:54) y. It was a pleasure.   I am getting some lab work today. You should get a letter in the mail. Otherwise, call back  Sexual dysfunction: I am prescribing Viagra. We spoke about some side effects to watch out for.  Please make an appointment to see me in 6 months for follow-up.  If you have any questions or concerns, please do not hesitate to call the office at [phone].  Sincerely,  Cordelia Poche, MD

## 2015-08-20 ENCOUNTER — Telehealth: Payer: Self-pay | Admitting: Family Medicine

## 2015-08-20 DIAGNOSIS — D539 Nutritional anemia, unspecified: Secondary | ICD-10-CM

## 2015-08-20 LAB — LIPID PANEL
CHOL/HDL RATIO: 2.1 ratio (ref <=5.0)
CHOLESTEROL: 137 mg/dL (ref 125–200)
HDL: 66 mg/dL (ref >=40)
LDL CALC: 49 mg/dL (ref <130)
Triglycerides: 111 mg/dL (ref <150)
VLDL: 22 mg/dL (ref <30)

## 2015-08-20 LAB — COMPLETE METABOLIC PANEL WITH GFR
ALT: 15 U/L (ref 9–46)
AST: 31 U/L (ref 10–35)
Albumin: 3.9 g/dL (ref 3.6–5.1)
Alkaline Phosphatase: 172 U/L — ABNORMAL HIGH (ref 40–115)
BUN: 9 mg/dL (ref 7–25)
CHLORIDE: 103 mmol/L (ref 98–110)
CO2: 24 mmol/L (ref 20–31)
CREATININE: 1.28 mg/dL — AB (ref 0.70–1.18)
Calcium: 8.6 mg/dL (ref 8.6–10.3)
GFR, Est African American: 64 mL/min (ref >=60)
GFR, Est Non African American: 56 mL/min — ABNORMAL LOW (ref >=60)
Glucose, Bld: 91 mg/dL (ref 65–99)
POTASSIUM: 4.3 mmol/L (ref 3.5–5.3)
Sodium: 137 mmol/L (ref 135–146)
Total Bilirubin: 1 mg/dL (ref 0.2–1.2)
Total Protein: 7.2 g/dL (ref 6.1–8.1)

## 2015-08-20 NOTE — Telephone Encounter (Signed)
Called and left message. Called to discuss patient's recent lab work. Patient Craig Moore need to come in to get more blood drawn for labs as he has an abnormal blood count. His kidney function has also worsened and Craig Moore need to follow-up in 3 months. He Craig Moore also need to stop nephrotoxic agents, such as NSAIDs, etc

## 2015-08-21 LAB — VITAMIN B12: VITAMIN B 12: 382 pg/mL (ref 211–911)

## 2015-08-21 LAB — FOLATE: Folate: 4.5 ng/mL

## 2015-08-21 NOTE — Telephone Encounter (Signed)
Pt returned call

## 2015-08-23 NOTE — Telephone Encounter (Signed)
Called patient again to discuss results and was met with voice mail. Left message instructing that if he were to call back to either give permission to leave a detailed voicemail or to give a better time to call.

## 2015-08-23 NOTE — Telephone Encounter (Signed)
Is it ok for me to call the pt and give him the information below? Ottis Stain, CMA

## 2015-08-27 ENCOUNTER — Other Ambulatory Visit: Payer: Self-pay | Admitting: Family Medicine

## 2015-08-27 NOTE — Telephone Encounter (Signed)
Pt called back and said that he Craig Moore be home today after 2:00 pm. He also said that if his voicemail picks up that Dr. Lonny Prude CAN leave a detailed message about his lab results. jw

## 2015-08-27 NOTE — Telephone Encounter (Signed)
Discussed results with patient. Advised to make an appointment with Lab to obtain a peripheral smear. Craig Moore likely refer to hematology for follow-up of his macrocytosis. Patient's questions answered.

## 2015-08-28 ENCOUNTER — Encounter: Payer: Self-pay | Admitting: Family Medicine

## 2015-08-28 ENCOUNTER — Other Ambulatory Visit: Payer: Self-pay | Admitting: *Deleted

## 2015-08-28 DIAGNOSIS — Z1211 Encounter for screening for malignant neoplasm of colon: Secondary | ICD-10-CM

## 2015-08-28 LAB — POC HEMOCCULT BLD/STL (HOME/3-CARD/SCREEN)
Card #3 Fecal Occult Blood, POC: NEGATIVE
FECAL OCCULT BLD: NEGATIVE
FECAL OCCULT BLD: NEGATIVE

## 2015-09-02 ENCOUNTER — Other Ambulatory Visit: Payer: Medicare Other

## 2015-09-02 DIAGNOSIS — D539 Nutritional anemia, unspecified: Secondary | ICD-10-CM

## 2015-09-02 LAB — FOLATE: FOLATE: 5.6 ng/mL

## 2015-09-02 LAB — VITAMIN B12: Vitamin B-12: 320 pg/mL (ref 211–911)

## 2015-09-02 NOTE — Progress Notes (Signed)
B12 , FOLATE AND PATH SMEAR DONE TODAY MARCI HOLDER

## 2015-09-03 ENCOUNTER — Telehealth: Payer: Self-pay | Admitting: Family Medicine

## 2015-09-03 DIAGNOSIS — D7589 Other specified diseases of blood and blood-forming organs: Secondary | ICD-10-CM

## 2015-09-03 LAB — PATHOLOGIST SMEAR REVIEW

## 2015-09-05 ENCOUNTER — Other Ambulatory Visit: Payer: Self-pay | Admitting: Cardiology

## 2015-09-05 NOTE — Telephone Encounter (Signed)
Left message for patient to call back. Would like to discuss results. Pathologist smear significant for some irregular shaped RBCs (teardrop and elliptocytes). I feel it better for patient to follow-up with hematologist regarding further work up.

## 2015-09-06 NOTE — Telephone Encounter (Signed)
Please call patient with last lab results

## 2015-09-11 DIAGNOSIS — H35342 Macular cyst, hole, or pseudohole, left eye: Secondary | ICD-10-CM | POA: Diagnosis not present

## 2015-09-11 DIAGNOSIS — H25813 Combined forms of age-related cataract, bilateral: Secondary | ICD-10-CM | POA: Diagnosis not present

## 2015-09-11 DIAGNOSIS — H35039 Hypertensive retinopathy, unspecified eye: Secondary | ICD-10-CM | POA: Diagnosis not present

## 2015-09-16 NOTE — Telephone Encounter (Signed)
Called patient to discuss results. Apologized that it took this long for him to receive a call back. Discussed reason for referral to hematology. Patient's questions answered. Follow-up with hematology on 09/19/15.

## 2015-09-19 ENCOUNTER — Encounter: Payer: Self-pay | Admitting: Hematology

## 2015-09-19 ENCOUNTER — Telehealth: Payer: Self-pay | Admitting: Hematology

## 2015-09-19 ENCOUNTER — Other Ambulatory Visit (HOSPITAL_BASED_OUTPATIENT_CLINIC_OR_DEPARTMENT_OTHER): Payer: Medicare Other

## 2015-09-19 ENCOUNTER — Ambulatory Visit (HOSPITAL_BASED_OUTPATIENT_CLINIC_OR_DEPARTMENT_OTHER): Payer: Medicare Other | Admitting: Hematology

## 2015-09-19 VITALS — BP 142/79 | HR 69 | Temp 98.7°F | Resp 18 | Ht 71.0 in | Wt 159.9 lb

## 2015-09-19 DIAGNOSIS — D649 Anemia, unspecified: Secondary | ICD-10-CM

## 2015-09-19 DIAGNOSIS — D539 Nutritional anemia, unspecified: Secondary | ICD-10-CM | POA: Diagnosis not present

## 2015-09-19 DIAGNOSIS — R932 Abnormal findings on diagnostic imaging of liver and biliary tract: Secondary | ICD-10-CM

## 2015-09-19 DIAGNOSIS — R161 Splenomegaly, not elsewhere classified: Secondary | ICD-10-CM

## 2015-09-19 LAB — COMPREHENSIVE METABOLIC PANEL (CC13)
ALT: 11 U/L (ref 0–55)
ANION GAP: 6 meq/L (ref 3–11)
AST: 18 U/L (ref 5–34)
Albumin: 3.4 g/dL — ABNORMAL LOW (ref 3.5–5.0)
Alkaline Phosphatase: 160 U/L — ABNORMAL HIGH (ref 40–150)
BUN: 8.5 mg/dL (ref 7.0–26.0)
CHLORIDE: 105 meq/L (ref 98–109)
CO2: 27 meq/L (ref 22–29)
Calcium: 8.8 mg/dL (ref 8.4–10.4)
Creatinine: 1.2 mg/dL (ref 0.7–1.3)
EGFR: 67 mL/min/{1.73_m2} — AB (ref >90)
GLUCOSE: 88 mg/dL (ref 70–140)
Potassium: 4.3 mEq/L (ref 3.5–5.1)
SODIUM: 139 meq/L (ref 136–145)
Total Bilirubin: 0.47 mg/dL (ref 0.20–1.20)
Total Protein: 7.2 g/dL (ref 6.4–8.3)

## 2015-09-19 LAB — CBC & DIFF AND RETIC
BASO%: 0.4 % (ref 0.0–2.0)
Basophils Absolute: 0 10*3/uL (ref 0.0–0.1)
EOS%: 5.8 % (ref 0.0–7.0)
Eosinophils Absolute: 0.3 10*3/uL (ref 0.0–0.5)
HCT: 36.1 % — ABNORMAL LOW (ref 38.4–49.9)
HGB: 12.3 g/dL — ABNORMAL LOW (ref 13.0–17.1)
IMMATURE RETIC FRACT: 2.6 % — AB (ref 3.00–10.60)
LYMPH#: 1.5 10*3/uL (ref 0.9–3.3)
LYMPH%: 29.4 % (ref 14.0–49.0)
MCH: 40.9 pg — ABNORMAL HIGH (ref 27.2–33.4)
MCHC: 34.1 g/dL (ref 32.0–36.0)
MCV: 119.9 fL — ABNORMAL HIGH (ref 79.3–98.0)
MONO#: 0.5 10*3/uL (ref 0.1–0.9)
MONO%: 10.5 % (ref 0.0–14.0)
NEUT%: 53.9 % (ref 39.0–75.0)
NEUTROS ABS: 2.8 10*3/uL (ref 1.5–6.5)
PLATELETS: 208 10*3/uL (ref 140–400)
RBC: 3.01 10*6/uL — AB (ref 4.20–5.82)
RDW: 12.4 % (ref 11.0–14.6)
RETIC CT ABS: 34.01 10*3/uL — AB (ref 34.80–93.90)
Retic %: 1.13 % (ref 0.80–1.80)
WBC: 5.1 10*3/uL (ref 4.0–10.3)

## 2015-09-19 LAB — LACTATE DEHYDROGENASE (CC13): LDH: 185 U/L (ref 125–245)

## 2015-09-19 LAB — CHCC SMEAR

## 2015-09-19 NOTE — Telephone Encounter (Signed)
Patient sent back to lab and given avs report and appointments for January. [facility] call re [place] appointment - patient water.

## 2015-09-19 NOTE — Patient Instructions (Signed)
Please take   1) Multivitamin with mineral (eg Centrum A thru Z) 2) vit B complex  For atleast 3 months F/u in 3 months

## 2015-09-19 NOTE — Progress Notes (Signed)
Marland Kitchen    HEMATOLOGY/ONCOLOGY CONSULTATION NOTE  Date of Service: 09/19/2015  Patient Care Team: Mariel Aloe, MD as PCP - General Carlena Bjornstad, MD as Consulting Physician (Cardiology)  CHIEF COMPLAINTS/PURPOSE OF CONSULTATION:  Macrocytosis   HISTORY OF PRESENTING ILLNESS:  Craig Moore is a wonderful 74 y.o. male who has been referred to [place] by Dr .Cordelia Poche, MD  for evaluation and management of Macrocytosis.  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 The patient has been noted to have an elevated MCV ranging between 110 to 120 at least from 2011.  He notes that he does drink 2-3 sometimes more glasses of wine daily. he also smokes about a quarter to a half a pack of cigarettes daily. Denies any previous history of liver disease.  Recent overall GI bleeding or other overt bleeding on anticoagulation.  No fevers/chills/and expected weight loss or night sweats. No abdominal pain.  No chest pain.  No shortness of breath. Notes no overt dietary restrictions. Notes that he has had periods where he has not been eating well during periods of higher alcohol use. Currently on labs today his hemoglobin is 12.3, hematocrit of 36, MCV 120, WBC count 5.1, platelets 208k  He notes no tingling numbness of hands or feet.no overt balance problems.  No significant memory issues. He was noted to have some hepatomegaly and an ultrasound of the abdomen was requested.  MEDICAL HISTORY:  Past Medical History  Diagnosis Date  . Hyperlipidemia   . Hypertension   . Myocardial infarction ([place])   . CAD (coronary artery disease)     Non-STEMI May, 2006, bare-metal stent  //   nuclear, January, 2012, EF 70%, no ischemia  . Ejection fraction     EF 55%, echo, October, 2006, technically difficult  //  EF 70%, nuclear, January,  2012  . Tobacco abuse   . Pre-syncope     ER, January, 2011, dehydration  . DVT (deep venous thrombosis) ([place])     December, 2011, Coumadin therapy  . Groin pain     June, 2013  . 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 Coronary angioplasty  2006    SOCIAL HISTORY: Social History   Social History  . Marital Status: Legally Separated    Spouse Name: N/A  . Number of Children: N/A  . Years of Education: N/A   Occupational History  . Not on file.   Social History Main Topics  . Smoking status: Current Every Day Smoker -- 0.25 packs/day    Types: Cigarettes  . Smokeless tobacco: Not on file  . Alcohol Use: 4.8 oz/week    8 Glasses of wine per week  . Drug Use: No  . Sexual Activity: Not Currently   Other Topics Concern  . Not on file   Social History Narrative    FAMILY HISTORY: Family History  Problem Relation Age of Onset  . Cancer Mother   . Heart disease Father   . Diabetes Father   . Stroke Father   . Cancer Sister     breast    ALLERGIES:  has No Known Allergies.  MEDICATIONS:  Current Outpatient Prescriptions  Medication Sig Dispense Refill  .  aspirin EC 81 MG tablet Take 81 mg by mouth daily.    Marland Kitchen atorvastatin (LIPITOR) 20 MG tablet TAKE 1 TABLET BY MOUTH EVERY DAY 30 tablet 0  . lisinopril (PRINIVIL,ZESTRIL) 10 MG tablet Take 1 tablet (10 mg total) by mouth daily. 90 tablet 2  . metoprolol succinate (TOPROL-XL) 50 MG 24 hr tablet TAKE 1 TABLET BY MOUTH EVERY DAY WITH OR IMMEDIATELY FOLLOWING A MEAL 30 tablet 0  . rivaroxaban (XARELTO) 20 MG TABS tablet Take 1 tablet (20 mg total) by mouth daily with supper. 90 tablet 3  . sildenafil (VIAGRA) 100 MG tablet Take 0.5 tablets (50 mg total) by mouth daily as needed for erectile dysfunction. 10 tablet 11   No current facility-administered  medications for this visit.    REVIEW OF SYSTEMS:    10 Point review of Systems was done is negative except as noted above.  PHYSICAL EXAMINATION: ECOG PERFORMANCE STATUS: 1 - Symptomatic but completely ambulatory  . Filed Vitals:   09/19/15 1322  Height: 5\' 11"  (1.803 m)  Weight: 159 lb 14.4 oz (72.53 kg)   Filed Weights   09/19/15 1322  Weight: 159 lb 14.4 oz (72.53 kg)   .Body mass index is 22.31 kg/(m^2).  GENERAL:alert, in no acute distress and comfortable SKIN: skin color, texture, turgor are normal, no rashes or significant lesions EYES: normal, conjunctiva are pink and non-injected, sclera clear OROPHARYNX:no exudate, no erythema and lips, buccal mucosa, and tongue normal  NECK: supple, no JVD, thyroid normal size, non-tender, without nodularity LYMPH:  no palpable lymphadenopathy in the cervical, axillary or inguinal LUNGS: clear to auscultation with normal respiratory effort HEART: regular rate & rhythm,  no murmurs and no lower extremity edema ABDOMEN: abdomen soft, non-tender, normoactive bowel sounds, noted to have mild hepatomegaly with some discomfort on palpation. Musculoskeletal: no cyanosis of digits and no clubbing  PSYCH: alert & oriented x 3 with fluent speech NEURO: no focal motor/sensory deficits  LABORATORY DATA:  I have reviewed the data as listed  . CBC Latest Ref Rng 09/19/2015 08/19/2015 08/28/2014  WBC 4.0 - 10.3 10e3/uL 5.1 5.3 4.8  Hemoglobin 13.0 - 17.1 g/dL 12.3(L) 11.9(L) 15.3  Hematocrit 38.4 - 49.9 % 36.1(L) 35.2(L) 42.1  Platelets 140 - 400 10e3/uL 208 246 195   . CBC    Component Value Date/Time   WBC 5.1 09/19/2015 1416   WBC 5.3 08/19/2015 1508   RBC 3.01* 09/19/2015 1416   RBC 2.89* 08/19/2015 1508   HGB 12.3* 09/19/2015 1416   HGB 11.9* 08/19/2015 1508   HCT 36.1* 09/19/2015 1416   HCT 35.2* 08/19/2015 1508   PLT 208 09/19/2015 1416   PLT 246 08/19/2015 1508   MCV 119.9* 09/19/2015 1416   MCV 121.8* 08/19/2015 1508    MCH 40.9* 09/19/2015 1416   MCH 41.2* 08/19/2015 1508   MCHC 34.1 09/19/2015 1416   MCHC 33.8 08/19/2015 1508   RDW 12.4 09/19/2015 1416   RDW 14.4 08/19/2015 1508   LYMPHSABS 1.5 09/19/2015 1416   LYMPHSABS 1.2 08/19/2015 1508   MONOABS 0.5 09/19/2015 1416   MONOABS 0.5 08/19/2015 1508   EOSABS 0.3 09/19/2015 1416   EOSABS 0.2 08/19/2015 1508   BASOSABS 0.0 09/19/2015 1416   BASOSABS 0.0 08/19/2015 1508   . Lab Results  Component Value Date   RETICCTPCT 1.13 09/19/2015   RBC 3.01* 09/19/2015   RETICCTABS 34.01* 09/19/2015    . Lab Results  Component Value Date   LDH 185 09/19/2015    .  Lab Results  Component Value Date   TOTALPROTELP 6.9 09/19/2015   ALBUMINELP 3.5* 09/19/2015   A1GS 0.4* 09/19/2015   A2GS 0.8 09/19/2015   BETS 0.4 09/19/2015   BETA2SER 0.5 09/19/2015   GAMS 1.2 09/19/2015   SPEI * 09/19/2015   (this displays SPEP labs)  No results found for: KPAFRELGTCHN, LAMBDASER, KAPLAMBRATIO (kappa/lambda light chains)      . CMP Latest Ref Rng 08/19/2015 08/28/2014 08/21/2014  Glucose 65 - 99 mg/dL 91 113(H) 94  BUN 7 - 25 mg/dL 9 16 9   Creatinine 0.70 - 1.18 mg/dL 1.28(H) 1.28 1.31  Sodium 135 - 146 mmol/L 137 136 137  Potassium 3.5 - 5.3 mmol/L 4.3 4.9 4.9  Chloride 98 - 110 mmol/L 103 99 99  CO2 20 - 31 mmol/L 24 23 26   Calcium 8.6 - 10.3 mg/dL 8.6 8.8 8.8  Total Protein 6.1 - 8.1 g/dL 7.2 - -  Total Bilirubin 0.2 - 1.2 mg/dL 1.0 - -  Alkaline Phos 40 - 115 U/L 172(H) - -  AST 10 - 35 U/L 31 - -  ALT 9 - 46 U/L 15 - -    Component     Latest Ref Rng 08/28/2014 08/19/2015 09/02/2015  IgG (Immunoglobin G), Serum     650 - 1600 mg/dL     IgA     68 - 379 mg/dL     IgM, Serum     41 - 251 mg/dL     Immunofix Electr Int          Total Protein, Serum Electrophoresis     6.1 - 8.1 g/dL     Albumin ELP     3.8 - 4.8 g/dL     Alpha-1 Glubulin     0.2 - 0.3 g/dL     Alpha-2 Globulin     0.5 - 0.9 g/dL     Beta Globulin     0.4 - 0.6 g/dL      Beta 2     0.2 - 0.5 g/dL     Gamma Globulin     0.8 - 1.7 g/dL     Abnormal Protein Band1          SPE Interp.          COMMENT (PROTEIN ELECTROPHOR)          Abnormal Protein Band2          Abnormal Protein Band3          Folate      6.3 4.5 5.6  Vitamin B-12     211 - 911 pg/mL 340 382 320  TSH     0.350 - 4.500 uIU/mL  1.084   LDH     125 - 245 U/L      Component     Latest Ref Rng 09/19/2015  IgG (Immunoglobin G), Serum     650 - 1600 mg/dL 1360  IgA     68 - 379 mg/dL 418 (H)  IgM, Serum     41 - 251 mg/dL 84  Immunofix Electr Int      *  Total Protein, Serum Electrophoresis     6.1 - 8.1 g/dL 6.9  Albumin ELP     3.8 - 4.8 g/dL 3.5 (L)  Alpha-1 Glubulin     0.2 - 0.3 g/dL 0.4 (H)  Alpha-2 Globulin     0.5 - 0.9 g/dL 0.8  Beta Globulin     0.4 - 0.6 g/dL 0.4  Beta  2     0.2 - 0.5 g/dL 0.5  Gamma Globulin     0.8 - 1.7 g/dL 1.2  Abnormal Protein Band1      NOT DET  SPE Interp.      *  COMMENT (PROTEIN ELECTROPHOR)      *  Abnormal Protein Band2      NOT DET  Abnormal Protein Band3      NOT DET  Folate        Vitamin B-12     211 - 911 pg/mL   TSH     0.350 - 4.500 uIU/mL   LDH     125 - 245 U/L 185    Peripheral Blood Smear (personally reviewed by me)  Macrocytic red blood cells.  few target cells.  No increased schistocytes.adequate platelets.  Note that it clumping.  No increased blasts.  Normal myeloid maturation.    RADIOGRAPHIC STUDIES: I have personally reviewed the radiological images as listed and agreed with the findings in the report. US Abdomen Complete  09/26/2015  CLINICAL DATA:  Evaluate liver and spleen, abnormal shape of the red blood cells EXAM: ULTRASOUND ABDOMEN COMPLETE COMPARISON:  None. FINDINGS: Gallbladder: There is abnormality of the gallbladder wall near the fundus of the gallbladder with inhomogeneous soft tissue structure present with some acoustical shadowing. This area measures approximately 28 mm in  diameter. Although this could represent an unusual manifestation of focal adenomyomatosis, a gallbladder malignancy cannot be excluded. CT of the abdomen without and with IV contrast media versus MR of the abdomen is recommended to evaluate this area further. There appears to be a single gallstone of 5 mm in diameter. The gallbladder wall is not thickened and there is no pain over the gallbladder with compression. Common bile duct: Diameter: The common bile duct is within upper limits of normal measuring 6.8 mm in diameter. Liver: The echogenicity of the liver parenchyma is within normal limits. The contours of the liver are not nodular. There is slight prominence of central intrahepatic ducts however and again CT of the abdomen without and with IV contrast or MRI with MRCP would be helpful in assessing for a possible obstructing lesion. IVC: No abnormality visualized. Pancreas: The pancreas appears normal in the midbody with portions of the head and tail of the pancreas obscured by bowel gas. The pancreatic duct measures 2.3 mm in diameter. Spleen: The spleen is unremarkable measuring 6.3 cm. No focal splenic abnormality is seen. Right Kidney: Length: 9.5 cm. No hydronephrosis is noted. A cyst is noted laterally of 2.1 cm in diameter. Left Kidney: Length: 10.2 cm. No hydronephrosis is seen. An echogenic focus in the upper pole of the left kidney of 5 mm in diameter is present which may represent a nonobstructing calculus. Abdominal aorta: The abdominal aorta is normal in caliber. Other findings: None. IMPRESSION: 1. Abnormal focus involving the gallbladder wall near the fundus. Possible unusual manifestation of adenomyomatosis, but a gallbladder malignancy cannot be excluded. Recommend CT of the abdomen without and with IV contrast versus MR. 2. Prominent intrahepatic ducts. Again CT of the abdomen or MRI with MR CP would be helpful to assess for a possible point of obstruction. 3. Single small gallstone. 4.  Possible single 5 mm nonobstructing left renal calculus. Electronically Signed   By: Ivar Drape M.D.   On: 09/26/2015 14:11    ASSESSMENT & PLAN:   74 year old African American male with  #1 macrocytosis without significant anemia.  No significant reticulocytosis noted.  Concern  for heavier alcohol use and the patient reports. Cannot rule out underlying liver disease.  Normal RDW makes a nutritional deficiency less likely.  B12 and folate low normal.  Cannot rule out other B vitamin deficiencies in the setting of alcohol use such as thiamine  Responsive macrocytosis.  #2 mild splenomegaly.  We ordered an got an ultrasound of the abdomen which shows an abnormal gallbladder findings concerning for possible gallbladder tumor.  He was also noted to have intrahepatic biliary dilatation of unclear etiology.  Plan  -absolute alcohol cessation -B complex and folic acid daily over-the-counter recommended -MRCP/MRI of the abdomen to further evaluate the gallbladder and liver as well as the hepatobiliary tree.Scheduled for 10/11/2015 -Might require a GI consultation. -If concern for gallbladder pathology might require a surgical consultation. -Counseled extensively on smoking cessation given his history of DVTs, cardiac history, and other things.  Patient notes that he has been trying to cut down but this is quite difficult and he is not at that point that he can completely quit.  Return to clinic in 3-4 days after MRCP/MRI of the abdomen   All of the patients questions were answered to his apparent satisfaction. The patient knows to call the clinic with any problems, questions or concerns.  I spent 50 minutes counseling the patient face to face. The total time spent in the appointment was 60 minutes and more than 50% was on counseling and direct patient cares.    Sullivan Lone MD [place] AAHIVMS [facility] Hematology/Oncology Physician [place]  (Office):       [phone] (Work  cell):  [phone] (Fax):           [phone]  09/19/2015 1:34 PM

## 2015-09-23 LAB — SPEP & IFE WITH QIG
ALBUMIN ELP: 3.5 g/dL — AB (ref 3.8–4.8)
ALPHA-1-GLOBULIN: 0.4 g/dL — AB (ref 0.2–0.3)
Alpha-2-Globulin: 0.8 g/dL (ref 0.5–0.9)
BETA 2: 0.5 g/dL (ref 0.2–0.5)
Beta Globulin: 0.4 g/dL (ref 0.4–0.6)
GAMMA GLOBULIN: 1.2 g/dL (ref 0.8–1.7)
IGA: 418 mg/dL — AB (ref 68–379)
IGG (IMMUNOGLOBIN G), SERUM: 1360 mg/dL (ref 650–1600)
IGM, SERUM: 84 mg/dL (ref 41–251)
TOTAL PROTEIN, SERUM ELECTROPHOR: 6.9 g/dL (ref 6.1–8.1)

## 2015-09-26 ENCOUNTER — Ambulatory Visit (HOSPITAL_COMMUNITY)
Admission: RE | Admit: 2015-09-26 | Discharge: 2015-09-26 | Disposition: A | Discharge status: Home / Self Care | Payer: Medicare Other | Source: Ambulatory Visit | Attending: Hematology | Admitting: Hematology

## 2015-09-26 DIAGNOSIS — R932 Abnormal findings on diagnostic imaging of liver and biliary tract: Secondary | ICD-10-CM | POA: Diagnosis not present

## 2015-09-26 DIAGNOSIS — K8020 Calculus of gallbladder without cholecystitis without obstruction: Secondary | ICD-10-CM | POA: Insufficient documentation

## 2015-09-26 DIAGNOSIS — R161 Splenomegaly, not elsewhere classified: Secondary | ICD-10-CM | POA: Insufficient documentation

## 2015-09-26 DIAGNOSIS — N281 Cyst of kidney, acquired: Secondary | ICD-10-CM | POA: Diagnosis not present

## 2015-09-27 ENCOUNTER — Telehealth: Payer: Self-pay | Admitting: Hematology

## 2015-09-27 ENCOUNTER — Other Ambulatory Visit: Payer: Self-pay | Admitting: Hematology

## 2015-09-27 DIAGNOSIS — K7689 Other specified diseases of liver: Secondary | ICD-10-CM

## 2015-09-27 DIAGNOSIS — R932 Abnormal findings on diagnostic imaging of liver and biliary tract: Secondary | ICD-10-CM

## 2015-09-27 NOTE — Telephone Encounter (Signed)
per pof to sch pt appt--cld pt and adv of time & date of appt-adv [facility] call to sch MR-gave pt # tocall in case no call

## 2015-10-04 MED ORDER — FOLIC ACID 1 MG PO TABS: 1.0000 mg | ORAL_TABLET | Freq: Every day | ORAL | Status: DC

## 2015-10-04 MED ORDER — B COMPLEX VITAMINS PO CAPS: 1.0000 | ORAL_CAPSULE | Freq: Every day | ORAL | Status: AC

## 2015-10-06 ENCOUNTER — Other Ambulatory Visit: Payer: Self-pay | Admitting: Cardiology

## 2015-10-11 ENCOUNTER — Other Ambulatory Visit: Payer: Self-pay | Admitting: Hematology

## 2015-10-11 ENCOUNTER — Ambulatory Visit (HOSPITAL_COMMUNITY)
Admission: RE | Admit: 2015-10-11 | Discharge: 2015-10-11 | Disposition: A | Discharge status: Home / Self Care | Payer: Medicare Other | Source: Ambulatory Visit | Attending: Hematology | Admitting: Hematology

## 2015-10-11 DIAGNOSIS — R932 Abnormal findings on diagnostic imaging of liver and biliary tract: Secondary | ICD-10-CM | POA: Diagnosis not present

## 2015-10-11 DIAGNOSIS — N281 Cyst of kidney, acquired: Secondary | ICD-10-CM | POA: Diagnosis not present

## 2015-10-11 DIAGNOSIS — K7689 Other specified diseases of liver: Secondary | ICD-10-CM

## 2015-10-11 DIAGNOSIS — K828 Other specified diseases of gallbladder: Secondary | ICD-10-CM | POA: Diagnosis not present

## 2015-10-11 DIAGNOSIS — R935 Abnormal findings on diagnostic imaging of other abdominal regions, including retroperitoneum: Secondary | ICD-10-CM | POA: Diagnosis not present

## 2015-10-11 MED ORDER — GADOBENATE DIMEGLUMINE 529 MG/ML IV SOLN
15.0000 mL | Freq: Once | INTRAVENOUS | Status: AC | PRN
  Administered 2015-10-11: 14 mL via INTRAVENOUS

## 2015-10-14 ENCOUNTER — Ambulatory Visit (HOSPITAL_BASED_OUTPATIENT_CLINIC_OR_DEPARTMENT_OTHER): Payer: Medicare Other | Admitting: Hematology

## 2015-10-14 ENCOUNTER — Encounter: Payer: Self-pay | Admitting: Hematology

## 2015-10-14 VITALS — BP 159/67 | HR 74 | Temp 97.9°F | Resp 20 | Ht 70.0 in | Wt 158.4 lb

## 2015-10-14 DIAGNOSIS — D7589 Other specified diseases of blood and blood-forming organs: Secondary | ICD-10-CM

## 2015-10-14 DIAGNOSIS — R932 Abnormal findings on diagnostic imaging of liver and biliary tract: Secondary | ICD-10-CM

## 2015-10-15 ENCOUNTER — Telehealth: Payer: Self-pay | Admitting: Hematology

## 2015-10-15 NOTE — Telephone Encounter (Signed)
s.w. pt and advised on May appt....pt wanted to cx Jan 2017 appt.Marland KitchenMarland KitchenMarland KitchenMarland Kitchenpt ok and aware

## 2015-11-04 ENCOUNTER — Other Ambulatory Visit: Payer: Self-pay | Admitting: Cardiology

## 2015-11-04 ENCOUNTER — Other Ambulatory Visit: Payer: Self-pay | Admitting: *Deleted

## 2015-11-04 MED ORDER — LISINOPRIL 10 MG PO TABS: 10.0000 mg | ORAL_TABLET | Freq: Every day | ORAL | Status: DC

## 2015-11-04 NOTE — Telephone Encounter (Signed)
Refill approved. Sent to pharmacy. 

## 2015-11-06 NOTE — Progress Notes (Signed)
Marland Kitchen    HEMATOLOGY/ONCOLOGY CONSULTATION NOTE  Date of Service: 11/06/2015  Patient Care Team: Mariel Aloe, MD as PCP - General Carlena Bjornstad, MD as Consulting Physician (Cardiology)  CHIEF COMPLAINTS/PURPOSE OF CONSULTATION: abrnormal gallbladder/liver imaging  DIAGNOSIS 1)  Macrocytosis without significant anemia 2) ? Gallbladder tumor. Intrahepatic biliary dilatation of unclear etiology.   INTERVAL HISTORY  Craig Moore is here for f/u of his abdominal imaging studies and blood tests. He notes no new significant symptoms. Has been trying to cut down on the smoking. No abdominal pain/fever/chills/change in bowel habits.  MEDICAL HISTORY:  Past Medical History  Diagnosis Date  . Hyperlipidemia   . Hypertension   . Myocardial infarction ([place])   . CAD (coronary artery disease)     Non-STEMI May, 2006, bare-metal stent  //   nuclear, January, 2012, EF 70%, no ischemia  . Ejection fraction     EF 55%, echo, October, 2006, technically difficult  //  EF 70%, nuclear, January, 2012  . Tobacco abuse   . Pre-syncope     ER, January, 2011, dehydration  . DVT (deep venous thrombosis) ([place])     December, 2011, Coumadin therapy  . Groin pain     June, 2013  . 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 Coronary angioplasty  2006    SOCIAL HISTORY: Social History   Social History  . Marital Status: Legally Separated    Spouse Name: N/A  . Number of Children: N/A  . Years of Education: N/A   Occupational History  . Not on file.   Social History Main Topics  . Smoking status: Current Every Day Smoker -- 0.25 packs/day    Types: Cigarettes  . Smokeless tobacco: Not on file  . Alcohol Use: 4.8 oz/week    8 Glasses of wine per week  . Drug Use: No  . Sexual Activity: Not Currently   Other Topics Concern  . Not on  file   Social History Narrative    FAMILY HISTORY: Family History  Problem Relation Age of Onset  . Cancer Mother   . Heart disease Father   . Diabetes Father   . Stroke Father   . Cancer Sister     breast    ALLERGIES:  has No Known Allergies.  MEDICATIONS:  Current Outpatient Prescriptions  Medication Sig Dispense Refill  . aspirin EC 81 MG tablet Take 81 mg by mouth daily.    Marland Kitchen atorvastatin (LIPITOR) 20 MG tablet TAKE 1 TABLET BY MOUTH EVERY DAY 30 tablet 0  . b complex vitamins capsule Take 1 capsule by mouth daily.    . folic acid (FOLVITE) 1 MG tablet Take 1 tablet (1 mg total) by mouth daily.    Marland Kitchen lisinopril (PRINIVIL,ZESTRIL) 10 MG tablet Take 1 tablet (10 mg total) by mouth daily. 90 tablet 2  . metoprolol succinate (TOPROL-XL) 50 MG 24 hr tablet TAKE 1 TABLET BY MOUTH EVERY DAY WITH OR IMMEDIATELY FOLLOWING A MEAL 30 tablet 0  . rivaroxaban (XARELTO) 20 MG TABS tablet Take 1 tablet (20 mg total) by mouth daily with supper. 90 tablet 3  . sildenafil (VIAGRA) 100 MG tablet Take 0.5 tablets (50 mg total) by mouth daily as needed for erectile dysfunction. 10 tablet 11   No current facility-administered medications for this visit.    REVIEW  OF SYSTEMS:    10 Point review of Systems was done is negative except as noted above.  PHYSICAL EXAMINATION: ECOG PERFORMANCE STATUS: 1 - Symptomatic but completely ambulatory  . Filed Vitals:   10/14/15 1255  Height: 5\' 10"  (1.778 m)  Weight: 158 lb 6.4 oz (71.85 kg)   Filed Weights   10/14/15 1255  Weight: 158 lb 6.4 oz (71.85 kg)   .Body mass index is 22.73 kg/(m^2).  GENERAL:alert, in no acute distress and comfortable SKIN: skin color, texture, turgor are normal, no rashes or significant lesions EYES: normal, conjunctiva are pink and non-injected, sclera clear OROPHARYNX:no exudate, no erythema and lips, buccal mucosa, and tongue normal  NECK: supple, no JVD, thyroid normal size, non-tender, without  nodularity LYMPH:  no palpable lymphadenopathy in the cervical, axillary or inguinal LUNGS: clear to auscultation with normal respiratory effort HEART: regular rate & rhythm,  no murmurs and no lower extremity edema ABDOMEN: abdomen soft, non-tender, normoactive bowel sounds, noted to have mild hepatomegaly with some discomfort on palpation. Musculoskeletal: no cyanosis of digits and no clubbing  PSYCH: alert & oriented x 3 with fluent speech NEURO: no focal motor/sensory deficits  LABORATORY DATA:  I have reviewed the data as listed  . CBC Latest Ref Rng 09/19/2015 08/19/2015 08/28/2014  WBC 4.0 - 10.3 10e3/uL 5.1 5.3 4.8  Hemoglobin 13.0 - 17.1 g/dL 12.3(L) 11.9(L) 15.3  Hematocrit 38.4 - 49.9 % 36.1(L) 35.2(L) 42.1  Platelets 140 - 400 10e3/uL 208 246 195   . CBC    Component Value Date/Time   WBC 5.1 09/19/2015 1416   WBC 5.3 08/19/2015 1508   RBC 3.01* 09/19/2015 1416   RBC 2.89* 08/19/2015 1508   HGB 12.3* 09/19/2015 1416   HGB 11.9* 08/19/2015 1508   HCT 36.1* 09/19/2015 1416   HCT 35.2* 08/19/2015 1508   PLT 208 09/19/2015 1416   PLT 246 08/19/2015 1508   MCV 119.9* 09/19/2015 1416   MCV 121.8* 08/19/2015 1508   MCH 40.9* 09/19/2015 1416   MCH 41.2* 08/19/2015 1508   MCHC 34.1 09/19/2015 1416   MCHC 33.8 08/19/2015 1508   RDW 12.4 09/19/2015 1416   RDW 14.4 08/19/2015 1508   LYMPHSABS 1.5 09/19/2015 1416   LYMPHSABS 1.2 08/19/2015 1508   MONOABS 0.5 09/19/2015 1416   MONOABS 0.5 08/19/2015 1508   EOSABS 0.3 09/19/2015 1416   EOSABS 0.2 08/19/2015 1508   BASOSABS 0.0 09/19/2015 1416   BASOSABS 0.0 08/19/2015 1508   . Lab Results  Component Value Date   RETICCTPCT 1.13 09/19/2015   RBC 3.01* 09/19/2015   RETICCTABS 34.01* 09/19/2015    . Lab Results  Component Value Date   LDH 185 09/19/2015    . Lab Results  Component Value Date   TOTALPROTELP 6.9 09/19/2015   ALBUMINELP 3.5* 09/19/2015   A1GS 0.4* 09/19/2015   A2GS 0.8 09/19/2015   BETS 0.4  09/19/2015   BETA2SER 0.5 09/19/2015   GAMS 1.2 09/19/2015   SPEI * 09/19/2015   (this displays SPEP labs)  No results found for: KPAFRELGTCHN, LAMBDASER, KAPLAMBRATIO (kappa/lambda light chains)    CMP Latest Ref Rng 09/19/2015 08/19/2015 08/28/2014  Glucose 70 - 140 mg/dl 88 91 113(H)  BUN 7.0 - 26.0 mg/dL 8.5 9 16   Creatinine 0.7 - 1.3 mg/dL 1.2 1.28(H) 1.28  Sodium 136 - 145 mEq/L 139 137 136  Potassium 3.5 - 5.1 mEq/L 4.3 4.3 4.9  Chloride 98 - 110 mmol/L - 103 99  CO2 22 - 29 mEq/L 27  24 23  Calcium 8.4 - 10.4 mg/dL 8.8 8.6 8.8  Total Protein 6.4 - 8.3 g/dL 7.2 7.2 -  Total Bilirubin 0.20 - 1.20 mg/dL 0.47 1.0 -  Alkaline Phos 40 - 150 U/L 160(H) 172(H) -  AST 5 - 34 U/L 18 31 -  ALT 0 - 55 U/L 11 15 -    Component     Latest Ref Rng 08/28/2014 08/19/2015 09/02/2015  IgG (Immunoglobin G), Serum     650 - 1600 mg/dL     IgA     68 - 379 mg/dL     IgM, Serum     41 - 251 mg/dL     Immunofix Electr Int          Total Protein, Serum Electrophoresis     6.1 - 8.1 g/dL     Albumin ELP     3.8 - 4.8 g/dL     Alpha-1 Glubulin     0.2 - 0.3 g/dL     Alpha-2 Globulin     0.5 - 0.9 g/dL     Beta Globulin     0.4 - 0.6 g/dL     Beta 2     0.2 - 0.5 g/dL     Gamma Globulin     0.8 - 1.7 g/dL     Abnormal Protein Band1          SPE Interp.          COMMENT (PROTEIN ELECTROPHOR)          Abnormal Protein Band2          Abnormal Protein Band3          Folate      6.3 4.5 5.6  Vitamin B-12     211 - 911 pg/mL 340 382 320  TSH     0.350 - 4.500 uIU/mL  1.084   LDH     125 - 245 U/L      Component     Latest Ref Rng 09/19/2015  IgG (Immunoglobin G), Serum     650 - 1600 mg/dL 1360  IgA     68 - 379 mg/dL 418 (H)  IgM, Serum     41 - 251 mg/dL 84  Immunofix Electr Int      *  Total Protein, Serum Electrophoresis     6.1 - 8.1 g/dL 6.9  Albumin ELP     3.8 - 4.8 g/dL 3.5 (L)  Alpha-1 Glubulin     0.2 - 0.3 g/dL 0.4 (H)  Alpha-2 Globulin     0.5 -  0.9 g/dL 0.8  Beta Globulin     0.4 - 0.6 g/dL 0.4  Beta 2     0.2 - 0.5 g/dL 0.5  Gamma Globulin     0.8 - 1.7 g/dL 1.2  Abnormal Protein Band1      NOT DET  SPE Interp.      *  COMMENT (PROTEIN ELECTROPHOR)      *  Abnormal Protein Band2      NOT DET  Abnormal Protein Band3      NOT DET  Folate        Vitamin B-12     211 - 911 pg/mL   TSH     0.350 - 4.500 uIU/mL   LDH     125 - 245 U/L 185   RADIOGRAPHIC STUDIES: I have personally reviewed the radiological images as listed and agreed with the findings in the report.  [place] abd 09/26/2015: IMPRESSION: 1. Abnormal focus involving the gallbladder wall near the fundus. Possible unusual manifestation of adenomyomatosis, but a gallbladder malignancy cannot be excluded. Recommend CT of the abdomen without and with IV contrast versus Craig. 2. Prominent intrahepatic ducts. Again CT of the abdomen or MRI with Craig CP would be helpful to assess for a possible point of obstruction. 3. Single small gallstone. 4. Possible single 5 mm nonobstructing left renal calculus.   Electronically Signed  By: Ivar Drape M.D.  On: 09/26/2015 14:11   Craig Abd W/wo Cm/mrcp  10/11/2015  CLINICAL DATA:  74 year old with possible gallbladder mass on ultrasound. No history of malignancy. Initial encounter. EXAM: MRI ABDOMEN WITHOUT AND WITH CONTRAST (INCLUDING MRCP) TECHNIQUE: Multiplanar multisequence Craig imaging of the abdomen was performed both before and after the administration of intravenous contrast. Heavily T2-weighted images of the biliary and pancreatic ducts were obtained, and three-dimensional MRCP images were rendered by post processing. CONTRAST:  52mL MULTIHANCE GADOBENATE DIMEGLUMINE 529 MG/ML IV SOLN COMPARISON:  Ultrasound 09/26/2015.  Chest CT 12/16/2010. FINDINGS: Lower chest:  Unremarkable. Hepatobiliary: The liver appears normal in signal and demonstrates no focal mass lesion or abnormal enhancement. The lesion of concern involving  the lateral wall of the gallbladder near the fundus measures 2.1 x 2.1 x 1.1 cm. This is well-circumscribed with peripheral T2 hyperintensity, central low T2 signal and mildly heterogeneous intrinsic T1 shortening. Although appearing to protrude into the gallbladder lumen on ultrasound, the epicenter on this examination is either in the wall of the gallbladder or in the adjacent liver. No enhancement of this lesion is seen following contrast, best demonstrated on the subtracted images. There is no generalized gallbladder wall thickening, gallstones or extrahepatic biliary dilatation. However, there does appear to be irregular cystic dilatation of the intrahepatic biliary system in the left lobe. The right lobe appears normal. No surrounding abnormal enhancement is seen following contrast. There is no hepatic atrophy in the left lobe. This finding is grossly stable compared with the chest CT from 2012. Pancreas: The pancreas appears normal. There is no pancreatic ductal dilatation or surrounding inflammation. No evidence of pancreas divisum or abnormal enhancement. Spleen: Normal in size without focal abnormality. Adrenals/Urinary Tract: Both adrenal glands appear normal. Both kidneys demonstrate small cortical cysts. There is cortical scarring and atrophy on the right. No enhancing renal mass or hydronephrosis. Stomach/Bowel: No evidence of bowel wall thickening, distention or surrounding inflammatory change. Vascular/Lymphatic: There are no enlarged abdominal lymph nodes. Mild aortoiliac atherosclerosis. Other: None. Musculoskeletal: No acute or significant osseous findings. IMPRESSION: 1. The lesion of concern within the gallbladder wall appears well circumscribed with heterogeneous signal, and no suspicious enhancement. 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 No evidence of neoplasm. 2. Apparent focal biliary ectasia within the left hepatic lobe of undetermined etiology and significance. The  extrahepatic biliary system appears normal and there is no evidence of choledocholithiasis, hepatic atrophy or associated abnormal enhancement. This is presumably a congenital or chronic postinflammatory finding and is grossly stable from prior chest CT. 3. Bilateral renal cysts and right renal cortical scarring. 4. To better evaluate the stability of the biliary and gallbladder findings, follow-up abdominal CT or MRCP in 6 months recommended. Electronically Signed   By: Richardean Sale M.D.   On: 10/11/2015 17:15    ASSESSMENT & PLAN:   74 year old African American male with  #1Macrocytosis without significant anemia.  No significant reticulocytosis noted.  Concern for heavier alcohol use than the patient reports. Cannot rule out underlying  liver disease.  Normal RDW makes a nutritional deficiency less likely.  B12 and folate low normal.  Cannot rule out other B vitamin deficiencies in the setting of alcohol use such as thiamine  Responsive macrocytosis.  #2 mild splenomegaly.  We ordered and had an ultrasound of the abdomen done which shows an abnormal gallbladder findings concerning for possible gallbladder tumor.  He was also noted to have intrahepatic biliary dilatation of unclear etiology. Patient subsequent head MRI/MRCP of the abdomen with suggests that the gallbladder lesion appears nonneoplastic with no enhancement. Possibly was postinflammatory scarring versus atypical adenomyosis. His left hepatic lobe intrahepatic biliary ectasia appears to be stable from a previous CT scan done in 2013 which is reassuring.  #3 history of DVT.  Plan -We discussed in detail the findings of his ultrasound MRI/MRCP. I explained the findings and the significance and the need for follow-up though there is no immediate overt evidence of a neoplastic lesion.  -absolute alcohol cessation -B complex and folic acid daily over-the-counter recommended -Repeat CT abdomen pelvis in 6 months to reevaluate gallbladder  and intrahepatic biliary ectasia. -Counseled on smoking cessation again.  Return to clinic with Dr. Irene Limbo in 6 months with repeat CBC, CMP and a CT abdomen pelvis.  All the patient's questions were answered in detail.    . Orders Placed This Encounter  Procedures  . CT Abdomen Pelvis W Wo Contrast    This exam should ONLY be ordered for initial diagnosis or follow up of known pancreatic/liver/renal/bladder masses.    Standing Status: Future     Number of Occurrences:      Standing Expiration Date: 01/13/2017    Order Specific Question:  If indicated for the ordered procedure, I authorize the administration of contrast media per Radiology protocol    Answer:  Yes    Order Specific Question:  Reason for Exam (SYMPTOM  OR DIAGNOSIS REQUIRED)    Answer:  followup on gall bladder mass/adenomyosis    Order Specific Question:  Preferred imaging location?    Answer:  [facility]  . CBC & Diff and Retic    Standing Status: Future     Number of Occurrences:      Standing Expiration Date: 11/17/2016  . Comprehensive metabolic panel    Standing Status: Future     Number of Occurrences:      Standing Expiration Date: 11/17/2016       Sullivan Lone MD [place] AAHIVMS [facility] Hematology/Oncology Physician [place]  (Office):       [phone] (Work cell):  [phone] (Fax):           [phone]

## 2015-12-07 ENCOUNTER — Other Ambulatory Visit: Payer: Self-pay | Admitting: Cardiology

## 2015-12-19 ENCOUNTER — Ambulatory Visit: Payer: Self-pay | Admitting: Hematology

## 2015-12-24 ENCOUNTER — Other Ambulatory Visit: Payer: Self-pay | Admitting: Cardiology

## 2016-01-07 ENCOUNTER — Encounter: Payer: Self-pay | Admitting: *Deleted

## 2016-01-17 ENCOUNTER — Encounter: Payer: Self-pay | Admitting: Cardiology

## 2016-01-17 ENCOUNTER — Ambulatory Visit (INDEPENDENT_AMBULATORY_CARE_PROVIDER_SITE_OTHER): Payer: Medicare Other | Admitting: Cardiology

## 2016-01-17 VITALS — BP 172/88 | HR 70 | Ht 70.0 in | Wt 169.4 lb

## 2016-01-17 DIAGNOSIS — I10 Essential (primary) hypertension: Secondary | ICD-10-CM | POA: Diagnosis not present

## 2016-01-17 DIAGNOSIS — I25810 Atherosclerosis of coronary artery bypass graft(s) without angina pectoris: Secondary | ICD-10-CM

## 2016-01-17 DIAGNOSIS — R0609 Other forms of dyspnea: Secondary | ICD-10-CM

## 2016-01-17 DIAGNOSIS — R0600 Dyspnea, unspecified: Secondary | ICD-10-CM

## 2016-01-17 MED ORDER — HYDROCHLOROTHIAZIDE 25 MG PO TABS: 25.0000 mg | ORAL_TABLET | Freq: Every day | ORAL | Status: DC

## 2016-01-17 MED ORDER — LISINOPRIL 20 MG PO TABS: 20.0000 mg | ORAL_TABLET | Freq: Every day | ORAL | Status: DC

## 2016-01-17 NOTE — Progress Notes (Signed)
Patient ID: Craig Moore, male   DOB: [birth year], 75 y.o.   MRN: [id number]      Cardiology Office Note  Date:  01/17/2016   ID:  Craig Moore, DOB 01-11-1941, MRN [id number]  PCP:  Cordelia Poche, MD  Cardiologist:  Dorothy Spark, MD (previosuly Dr Ron Parker)  Chief complain: dyspnea on exertion  History of Present Illness: Craig Moore is a 75 y.o. male who presents for followup coronary artery disease. He received a bare-metal stent in 2006. Nuclear study in 2012 revealed no ischemia. He has good LV function. 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 He's not having any significant problems. In reviewing his medications, I see that he has been on Zetia for many years. In talking with him there is no documentation of problems with a statin in the past. His LDL had been in the range of 67. This is probably why we did not push to use a statin in the past.  01/17/2016 - the patient is coming after one year, he has generally been doing well, he has been compliant to his medications, but has noticed that he gets more short of breath on exertion. He denies any chest pain that he never had chest pain. Before he had the stents placed in the past he felt short of breath as well as. He has noticed his blood pressure has been elevated and associated with headaches. He was recently discontinued on hydrochlorothiazide and he thinks that may difference for him. He denies any orthopnea, paroxysmal nocturnal dyspnea, lower extremity edema or claudications. No dizziness no syncope.   Past Medical History  Diagnosis Date  . Hyperlipidemia   . Hypertension   . Myocardial infarction ([place])   . CAD (coronary artery disease)     Non-STEMI May, 2006, bare-metal stent  //   nuclear, January, 2012, EF 70%, no ischemia  . Ejection fraction     EF 55%, echo, October, 2006, technically difficult  //  EF 70%, nuclear, January, 2012  . Tobacco abuse   . Pre-syncope     ER, January, 2011, dehydration  .  DVT (deep venous thrombosis) ([place])     December, 2011, Coumadin therapy  . Groin pain     June, 2013  . CHF (congestive heart failure) [facility])    Past Surgical History  Procedure Laterality Date  . Coronary angioplasty  2006   Current Outpatient Prescriptions  Medication Sig Dispense Refill  . aspirin EC 81 MG tablet Take 81 mg by mouth daily.    Marland Kitchen atorvastatin (LIPITOR) 20 MG tablet TAKE 1 TABLET (20 MG TOTAL) BY MOUTH DAILY. PLEASE SCHEDULE APPOINTMENT 30 tablet 0  . b complex vitamins capsule Take 1 capsule by mouth daily.    Marland Kitchen lisinopril (PRINIVIL,ZESTRIL) 10 MG tablet Take 1 tablet (10 mg total) by mouth daily. 90 tablet 2  . metoprolol succinate (TOPROL-XL) 50 MG 24 hr tablet TAKE 1 TABLET (50 MG TOTAL) BY MOUTH DAILY. PLEASE SCHEDULE APPOINTMENT 30 tablet 0  . rivaroxaban (XARELTO) 20 MG TABS tablet Take 1 tablet (20 mg total) by mouth daily with supper. 90 tablet 3  . sildenafil (VIAGRA) 100 MG tablet Take 0.5 tablets (50 mg total) by mouth daily as needed for erectile dysfunction. 10 tablet 11   No current facility-administered medications for this visit.   Allergies:   Review of patient's allergies indicates no known allergies.   Social History:  The patient  reports that he has been smoking Cigarettes.  He has been smoking about 0.25 packs per day. He does not have any smokeless tobacco history on file. He reports that he drinks about 4.8 oz of alcohol per week. He reports that he does not use illicit drugs.   Family History:  The patient's family history includes Breast cancer in his sister; Cancer in his mother; Diabetes in his father; Heart disease in his father; Stroke in his father.   ROS:  Please see the history of present illness.   Otherwise, review of systems are positive for none.   All other systems are reviewed and negative.   PHYSICAL EXAM: VS:  BP 172/88 mmHg  Pulse 70  Ht 5\' 10"  (1.778 m)  Wt 169 lb 6.4 oz (76.839 kg)  BMI 24.31 kg/m2 , BMI Body mass index  is 24.31 kg/(m^2). GEN: Well nourished, well developed, in no acute distress HEENT: normal Neck: no JVD, carotid bruits, or masses Cardiac: RRR; no murmurs, rubs, or gallops,no edema  Respiratory:  clear to auscultation bilaterally, normal work of breathing GI: soft, nontender, nondistended, + BS MS: no deformity or atrophy Skin: warm and dry, no rash Neuro:  Strength and sensation are intact Psych: euthymic mood, full affect  Recent Labs: 08/19/2015: TSH 1.084 09/19/2015: ALT 11; BUN 8.5; Creatinine 1.2; HGB 12.3*; Platelets 208; Potassium 4.3; Sodium 139   Lipid Panel    Component Value Date/Time   CHOL 137 08/19/2015 1508   TRIG 111 08/19/2015 1508   HDL 66 08/19/2015 1508   CHOLHDL 2.1 08/19/2015 1508   VLDL 22 08/19/2015 1508   LDLCALC 49 08/19/2015 1508   LDLDIRECT 67 01/13/2013 1527   Wt Readings from Last 3 Encounters:  01/17/16 169 lb 6.4 oz (76.839 kg)  10/14/15 158 lb 6.4 oz (71.85 kg)  10/11/15 160 lb (72.576 kg)    EKG: Sinus rhythm, first-degree AV block PVCs and left axis deviation.   ASSESSMENT AND PLAN:  1. Coronary artery disease - status post PCI in 2006, now new dyspnea on exertion, there is only new first degree AV block on his EKG but otherwise no ST-T wave abnormalities.his blood pressure significantly elevated, we Honest control and if his symptoms don't resolve we Calab consider stress test at the next visit.  2. Hypertension - uncontrolled we'll increase lisinopril 20 mg daily and add hydrocortisone to 25 mg daily, we [place]ack recheck in 2 months and also check creatinine at that time.  3. Lipids - all at goal on no therapy.  Follow-up in 2 months.  Signed, Dorothy Spark, MD  01/17/2016 3:39 PM    [facility] [place], [place], [place]  [address] Phone: [phone]; Fax: [phone]

## 2016-01-17 NOTE — Patient Instructions (Signed)
Medication Instructions:  Your physician has recommended you make the following change in your medication:  1. Increase lisinopril ( 20 mg ) daily,  2. Start HCTZ ( 25 mg ) daily, both sent in to patient's preferred pharmacy    Labwork: -None  Testing/Procedures: -None  Follow-Up: Your physician recommends that you keep your  scheduled  follow-up appointment with Dr. Meda Coffee   Any Other Special Instructions Craig Moore Listed Below (If Applicable).     If you need a refill on your cardiac medications before your next appointment, please call your pharmacy.

## 2016-01-21 ENCOUNTER — Other Ambulatory Visit: Payer: Self-pay | Admitting: Cardiology

## 2016-02-20 ENCOUNTER — Other Ambulatory Visit: Payer: Self-pay | Admitting: *Deleted

## 2016-02-20 MED ORDER — METOPROLOL SUCCINATE ER 50 MG PO TB24: ORAL_TABLET | ORAL | Status: DC

## 2016-02-21 ENCOUNTER — Other Ambulatory Visit: Payer: Self-pay

## 2016-02-21 MED ORDER — ATORVASTATIN CALCIUM 20 MG PO TABS: 20.0000 mg | ORAL_TABLET | Freq: Every day | ORAL | Status: DC

## 2016-03-02 ENCOUNTER — Other Ambulatory Visit: Payer: Self-pay | Admitting: *Deleted

## 2016-03-02 MED ORDER — RIVAROXABAN 20 MG PO TABS: 20.0000 mg | ORAL_TABLET | Freq: Every day | ORAL | Status: DC

## 2016-03-03 ENCOUNTER — Telehealth: Payer: Self-pay | Admitting: *Deleted

## 2016-03-03 NOTE — Telephone Encounter (Signed)
Prior Authorization for Xarelto 20 mg was approved via [employer] until 12/06/16.  Reference number: [id number]. Derl Barrow, RN

## 2016-03-03 NOTE — Telephone Encounter (Signed)
Prior Authorization received from [facility] pharmacy for Xarelto 20 mg. PA completed online at www.covermymeds.com. PA pending, review could take 24-72 hours to complete.  Derl Barrow, RN

## 2016-03-30 ENCOUNTER — Ambulatory Visit: Payer: Medicare Other | Admitting: Cardiology

## 2016-04-09 ENCOUNTER — Encounter: Payer: Self-pay | Admitting: Cardiology

## 2016-04-09 ENCOUNTER — Ambulatory Visit (INDEPENDENT_AMBULATORY_CARE_PROVIDER_SITE_OTHER): Payer: Medicare Other | Admitting: Cardiology

## 2016-04-09 VITALS — BP 132/82 | HR 72 | Ht 70.0 in | Wt 163.0 lb

## 2016-04-09 DIAGNOSIS — I2510 Atherosclerotic heart disease of native coronary artery without angina pectoris: Secondary | ICD-10-CM | POA: Diagnosis not present

## 2016-04-09 DIAGNOSIS — R0609 Other forms of dyspnea: Secondary | ICD-10-CM

## 2016-04-09 DIAGNOSIS — I119 Hypertensive heart disease without heart failure: Secondary | ICD-10-CM | POA: Diagnosis not present

## 2016-04-09 DIAGNOSIS — R0600 Dyspnea, unspecified: Secondary | ICD-10-CM

## 2016-04-09 DIAGNOSIS — I10 Essential (primary) hypertension: Secondary | ICD-10-CM | POA: Diagnosis not present

## 2016-04-09 DIAGNOSIS — E785 Hyperlipidemia, unspecified: Secondary | ICD-10-CM

## 2016-04-09 LAB — COMPREHENSIVE METABOLIC PANEL
ALT: 13 U/L (ref 9–46)
AST: 23 U/L (ref 10–35)
Albumin: 4.2 g/dL (ref 3.6–5.1)
Alkaline Phosphatase: 97 U/L (ref 40–115)
BUN: 15 mg/dL (ref 7–25)
CO2: 20 mmol/L (ref 20–31)
Calcium: 8.8 mg/dL (ref 8.6–10.3)
Chloride: 103 mmol/L (ref 98–110)
Creat: 1.66 mg/dL — ABNORMAL HIGH (ref 0.70–1.18)
Glucose, Bld: 86 mg/dL (ref 65–99)
Potassium: 5.5 mmol/L — ABNORMAL HIGH (ref 3.5–5.3)
Sodium: 134 mmol/L — ABNORMAL LOW (ref 135–146)
Total Bilirubin: 0.8 mg/dL (ref 0.2–1.2)
Total Protein: 7.2 g/dL (ref 6.1–8.1)

## 2016-04-09 MED ORDER — FUROSEMIDE 20 MG PO TABS: 20.0000 mg | ORAL_TABLET | Freq: Every day | ORAL | Status: DC

## 2016-04-09 NOTE — Patient Instructions (Signed)
Medication Instructions:   STOP TAKING HYDROCHLOROTHIAZIDE NOW  START TAKING LASIX 20 MG ONCE DAILY    Labwork:  TODAY--CMET    Follow-Up:  Your physician wants you to follow-up in: [place] receive a reminder letter in the mail two months in advance. If you don't receive a letter, please call our office to schedule the follow-up appointment.      If you need a refill on your cardiac medications before your next appointment, please call your pharmacy.

## 2016-04-09 NOTE — Addendum Note (Signed)
Addended by: Nuala Alpha on: 04/09/2016 03:39 PM   Modules accepted: Orders, Medications

## 2016-04-09 NOTE — Progress Notes (Signed)
Patient ID: Craig Moore, male   DOB: 09-09-1941, 75 y.o.   MRN: [id number]      Cardiology Office Note  Date:  04/09/2016   ID:  Craig Moore, DOB [birth year], MRN [id number]  PCP:  Cordelia Poche, MD  Cardiologist:  Dorothy Spark, MD (previosuly Dr Ron Parker)  Chief complain: dyspnea on exertion  History of Present Illness: Craig Moore is a 75 y.o. male who presents for followup coronary artery disease. He received a bare-metal stent in 2006. Nuclear study in 2012 revealed no ischemia. He has good LV function. 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 He's not having any significant problems. In reviewing his medications, I see that he has been on Zetia for many years. In talking with him there is no documentation of problems with a statin in the past. His LDL had been in the range of 67. This is probably why we did not push to use a statin in the past.  01/17/2016 - the patient is coming after one year, he has generally been doing well, he has been compliant to his medications, but has noticed that he gets more short of breath on exertion. He denies any chest pain that he never had chest pain. Before he had the stents placed in the past he felt short of breath as well as. He has noticed his blood pressure has been elevated and associated with headaches. He was recently discontinued on hydrochlorothiazide and he thinks that may difference for him. He denies any orthopnea, paroxysmal nocturnal dyspnea, lower extremity edema or claudications. No dizziness no syncope.  04/09/2016 the patient is coming after 3 months, at the last visit he was found to be hypertensive and we adjusted his medicine, his blood pressures now controlled and he feels significantly better and denies any dyspnea on exertion. He has no chest pain, no claudication palpitations or syncope. He states that using the past he was taking furosemide and he would prefer to switch Hydrocort Dyazide to furosemide.    Past  Medical History  Diagnosis Date  . Hyperlipidemia   . Hypertension   . Myocardial infarction ([place])   . CAD (coronary artery disease)     Non-STEMI May, 2006, bare-metal stent  //   nuclear, January, 2012, EF 70%, no ischemia  . Ejection fraction     EF 55%, echo, October, 2006, technically difficult  //  EF 70%, nuclear, January, 2012  . Tobacco abuse   . Pre-syncope     ER, January, 2011, dehydration  . DVT (deep venous thrombosis) ([place])     December, 2011, Coumadin therapy  . Groin pain     June, 2013  . CHF (congestive heart failure) [facility])    Past Surgical History  Procedure Laterality Date  . Coronary angioplasty  2006   Current Outpatient Prescriptions  Medication Sig Dispense Refill  . aspirin EC 81 MG tablet Take 81 mg by mouth daily.    Marland Kitchen atorvastatin (LIPITOR) 20 MG tablet Take 1 tablet (20 mg total) by mouth daily at 6 PM. 90 tablet 1  . b complex vitamins capsule Take 1 capsule by mouth daily.    . hydrochlorothiazide (HYDRODIURIL) 25 MG tablet Take 1 tablet (25 mg total) by mouth daily. 30 tablet 9  . lisinopril (PRINIVIL,ZESTRIL) 20 MG tablet Take 1 tablet (20 mg total) by mouth daily. 30 tablet 9  . metoprolol succinate (TOPROL-XL) 50 MG 24 hr tablet TAKE 1 TABLET (50 MG TOTAL) BY MOUTH DAILY  90 tablet 2  . rivaroxaban (XARELTO) 20 MG TABS tablet Take 1 tablet (20 mg total) by mouth daily with supper. 90 tablet 3  . sildenafil (VIAGRA) 100 MG tablet Take 0.5 tablets (50 mg total) by mouth daily as needed for erectile dysfunction. 10 tablet 11   No current facility-administered medications for this visit.   Allergies:   Review of patient's allergies indicates no known allergies.   Social History:  The patient  reports that he has been smoking Cigarettes.  He has been smoking about 0.25 packs per day. He does not have any smokeless tobacco history on file. He reports that he drinks about 4.8 oz of alcohol per week. He reports that he does not use illicit drugs.    Family History:  The patient's family history includes Breast cancer in his sister; Cancer in his mother; Diabetes in his father; Heart disease in his father; Stroke in his father.   ROS:  Please see the history of present illness.   Otherwise, review of systems are positive for none.   All other systems are reviewed and negative.   PHYSICAL EXAM: VS:  BP 132/82 mmHg  Pulse 72  Ht 5\' 10"  (1.778 m)  Wt 163 lb (73.936 kg)  BMI 23.39 kg/m2 , BMI Body mass index is 23.39 kg/(m^2). GEN: Well nourished, well developed, in no acute distress HEENT: normal Neck: no JVD, carotid bruits, or masses Cardiac: RRR; no murmurs, rubs, or gallops,no edema  Respiratory:  clear to auscultation bilaterally, normal work of breathing GI: soft, nontender, nondistended, + BS MS: no deformity or atrophy Skin: warm and dry, no rash Neuro:  Strength and sensation are intact Psych: euthymic mood, full affect  Recent Labs: 08/19/2015: TSH 1.084 09/19/2015: ALT 11; BUN 8.5; Creatinine 1.2; HGB 12.3*; Platelets 208; Potassium 4.3; Sodium 139   Lipid Panel    Component Value Date/Time   CHOL 137 08/19/2015 1508   TRIG 111 08/19/2015 1508   HDL 66 08/19/2015 1508   CHOLHDL 2.1 08/19/2015 1508   VLDL 22 08/19/2015 1508   LDLCALC 49 08/19/2015 1508   LDLDIRECT 67 01/13/2013 1527   Wt Readings from Last 3 Encounters:  04/09/16 163 lb (73.936 kg)  01/17/16 169 lb 6.4 oz (76.839 kg)  10/14/15 158 lb 6.4 oz (71.85 kg)    EKG: Sinus rhythm, first-degree AV block PVCs and left axis deviation.   ASSESSMENT AND PLAN:  1. Coronary artery disease - status post PCI in 2006, He is asymptomatic, therefore no ischemic workup indicated at this point. I would continue aspirin, atorvastatin, lisinopril, and metoprolol.  2. Dyspnea on exertion that resolved after his blood pressure is controlled, I would continue the same regimen. No ischemic workup necessary.   3. Hypertensive heart disease without heart failure,  blood pressure now controlled, I Craig Moore discontinue hydrochlorothiazide and start Lasix 20 mg daily.   4. Lipids - all at goal  atorvastatin 20 mg daily.   Follow-up in 1 year, check CMP to evaluate Crea and LFTs.   Signed, Dorothy Spark, MD  04/09/2016 2:55 PM    [facility] [place], [place], [place]  [address] Phone: [phone]; Fax: [phone]

## 2016-04-10 ENCOUNTER — Telehealth: Payer: Self-pay | Admitting: Cardiology

## 2016-04-10 DIAGNOSIS — N183 Chronic kidney disease, stage 3 unspecified: Secondary | ICD-10-CM | POA: Insufficient documentation

## 2016-04-10 DIAGNOSIS — R7989 Other specified abnormal findings of blood chemistry: Secondary | ICD-10-CM

## 2016-04-10 DIAGNOSIS — E871 Hypo-osmolality and hyponatremia: Secondary | ICD-10-CM

## 2016-04-10 NOTE — Telephone Encounter (Signed)
-----   Message from Dorothy Spark, MD sent at 04/10/2016 11:52 AM EDT ----- I would hold lasix and recheck BMP in 1 week.

## 2016-04-10 NOTE — Telephone Encounter (Signed)
Fu  Pt returning RN phone call- lab results. Please call back and discuss.   

## 2016-04-10 NOTE — Telephone Encounter (Signed)
Notified the pt that per Dr Meda Coffee, based on his labs, we need to hold his lasix and recheck a BMET in one week.  Informed the pt that I Ali place a hold on this in his med list.  Scheduled the pt a lab appt for next Friday  04/17/16, at our office, to check a BMET. Pt verbalized understanding and agrees with this plan.

## 2016-04-13 ENCOUNTER — Other Ambulatory Visit: Payer: Self-pay | Admitting: Hematology

## 2016-04-13 ENCOUNTER — Ambulatory Visit (HOSPITAL_COMMUNITY)
Admission: RE | Admit: 2016-04-13 | Discharge: 2016-04-13 | Disposition: A | Discharge status: Home / Self Care | Payer: Medicare Other | Source: Ambulatory Visit | Attending: Hematology | Admitting: Hematology

## 2016-04-13 ENCOUNTER — Encounter (HOSPITAL_COMMUNITY): Payer: Self-pay

## 2016-04-13 ENCOUNTER — Other Ambulatory Visit (HOSPITAL_BASED_OUTPATIENT_CLINIC_OR_DEPARTMENT_OTHER): Payer: Medicare Other

## 2016-04-13 DIAGNOSIS — D7589 Other specified diseases of blood and blood-forming organs: Secondary | ICD-10-CM

## 2016-04-13 DIAGNOSIS — M47816 Spondylosis without myelopathy or radiculopathy, lumbar region: Secondary | ICD-10-CM | POA: Diagnosis not present

## 2016-04-13 DIAGNOSIS — R932 Abnormal findings on diagnostic imaging of liver and biliary tract: Secondary | ICD-10-CM | POA: Insufficient documentation

## 2016-04-13 DIAGNOSIS — M160 Bilateral primary osteoarthritis of hip: Secondary | ICD-10-CM | POA: Insufficient documentation

## 2016-04-13 DIAGNOSIS — K829 Disease of gallbladder, unspecified: Secondary | ICD-10-CM | POA: Diagnosis not present

## 2016-04-13 DIAGNOSIS — M5136 Other intervertebral disc degeneration, lumbar region: Secondary | ICD-10-CM | POA: Diagnosis not present

## 2016-04-13 DIAGNOSIS — K828 Other specified diseases of gallbladder: Secondary | ICD-10-CM | POA: Diagnosis not present

## 2016-04-13 DIAGNOSIS — K5730 Diverticulosis of large intestine without perforation or abscess without bleeding: Secondary | ICD-10-CM | POA: Diagnosis not present

## 2016-04-13 DIAGNOSIS — I2510 Atherosclerotic heart disease of native coronary artery without angina pectoris: Secondary | ICD-10-CM | POA: Insufficient documentation

## 2016-04-13 DIAGNOSIS — I77819 Aortic ectasia, unspecified site: Secondary | ICD-10-CM | POA: Insufficient documentation

## 2016-04-13 DIAGNOSIS — R918 Other nonspecific abnormal finding of lung field: Secondary | ICD-10-CM | POA: Insufficient documentation

## 2016-04-13 LAB — COMPREHENSIVE METABOLIC PANEL
ALT: 14 U/L (ref 0–55)
ANION GAP: 6 meq/L (ref 3–11)
AST: 19 U/L (ref 5–34)
Albumin: 3.9 g/dL (ref 3.5–5.0)
Alkaline Phosphatase: 103 U/L (ref 40–150)
BILIRUBIN TOTAL: 0.72 mg/dL (ref 0.20–1.20)
BUN: 14.8 mg/dL (ref 7.0–26.0)
CO2: 27 meq/L (ref 22–29)
Calcium: 9.1 mg/dL (ref 8.4–10.4)
Chloride: 102 mEq/L (ref 98–109)
Creatinine: 1.8 mg/dL — ABNORMAL HIGH (ref 0.7–1.3)
EGFR: 42 mL/min/{1.73_m2} — AB (ref >90)
Glucose: 89 mg/dl (ref 70–140)
Potassium: 5.4 mEq/L — ABNORMAL HIGH (ref 3.5–5.1)
Sodium: 135 mEq/L — ABNORMAL LOW (ref 136–145)
TOTAL PROTEIN: 7.7 g/dL (ref 6.4–8.3)

## 2016-04-13 LAB — CBC & DIFF AND RETIC
BASO%: 0.4 % (ref 0.0–2.0)
Basophils Absolute: 0 10*3/uL (ref 0.0–0.1)
EOS ABS: 0.6 10*3/uL — AB (ref 0.0–0.5)
EOS%: 12.8 % — AB (ref 0.0–7.0)
HCT: 38.9 % (ref 38.4–49.9)
HEMOGLOBIN: 13.3 g/dL (ref 13.0–17.1)
Immature Retic Fract: 3 % (ref 3.00–10.60)
LYMPH%: 30.1 % (ref 14.0–49.0)
MCH: 35.4 pg — ABNORMAL HIGH (ref 27.2–33.4)
MCHC: 34.2 g/dL (ref 32.0–36.0)
MCV: 103.5 fL — AB (ref 79.3–98.0)
MONO#: 0.5 10*3/uL (ref 0.1–0.9)
MONO%: 9 % (ref 0.0–14.0)
NEUT%: 47.7 % (ref 39.0–75.0)
NEUTROS ABS: 2.4 10*3/uL (ref 1.5–6.5)
Platelets: 181 10*3/uL (ref 140–400)
RBC: 3.76 10*6/uL — AB (ref 4.20–5.82)
RDW: 12.6 % (ref 11.0–14.6)
RETIC %: 1.42 % (ref 0.80–1.80)
Retic Ct Abs: 53.39 10*3/uL (ref 34.80–93.90)
WBC: 5 10*3/uL (ref 4.0–10.3)
lymph#: 1.5 10*3/uL (ref 0.9–3.3)

## 2016-04-13 MED ORDER — IOPAMIDOL (ISOVUE-300) INJECTION 61%
80.0000 mL | Freq: Once | INTRAVENOUS | Status: AC | PRN
  Administered 2016-04-13: 80 mL via INTRAVENOUS

## 2016-04-17 ENCOUNTER — Other Ambulatory Visit (INDEPENDENT_AMBULATORY_CARE_PROVIDER_SITE_OTHER): Payer: Medicare Other | Admitting: *Deleted

## 2016-04-17 DIAGNOSIS — R7989 Other specified abnormal findings of blood chemistry: Secondary | ICD-10-CM

## 2016-04-17 DIAGNOSIS — E871 Hypo-osmolality and hyponatremia: Secondary | ICD-10-CM | POA: Diagnosis not present

## 2016-04-17 DIAGNOSIS — R748 Abnormal levels of other serum enzymes: Secondary | ICD-10-CM | POA: Diagnosis not present

## 2016-04-17 LAB — BASIC METABOLIC PANEL
BUN: 14 mg/dL (ref 7–25)
CO2: 26 mmol/L (ref 20–31)
Calcium: 8.8 mg/dL (ref 8.6–10.3)
Chloride: 101 mmol/L (ref 98–110)
Creat: 1.71 mg/dL — ABNORMAL HIGH (ref 0.70–1.18)
Glucose, Bld: 88 mg/dL (ref 65–99)
Potassium: 4.7 mmol/L (ref 3.5–5.3)
Sodium: 133 mmol/L — ABNORMAL LOW (ref 135–146)

## 2016-04-20 ENCOUNTER — Telehealth: Payer: Self-pay | Admitting: Hematology

## 2016-04-20 ENCOUNTER — Telehealth: Payer: Self-pay | Admitting: Cardiology

## 2016-04-20 ENCOUNTER — Encounter: Payer: Self-pay | Admitting: Hematology

## 2016-04-20 ENCOUNTER — Ambulatory Visit (HOSPITAL_BASED_OUTPATIENT_CLINIC_OR_DEPARTMENT_OTHER): Payer: Medicare Other | Admitting: Hematology

## 2016-04-20 ENCOUNTER — Other Ambulatory Visit: Payer: Self-pay | Admitting: *Deleted

## 2016-04-20 VITALS — BP 154/77 | HR 54 | Temp 98.3°F | Resp 18 | Ht 70.0 in | Wt 165.0 lb

## 2016-04-20 DIAGNOSIS — R161 Splenomegaly, not elsewhere classified: Secondary | ICD-10-CM

## 2016-04-20 DIAGNOSIS — K829 Disease of gallbladder, unspecified: Secondary | ICD-10-CM | POA: Diagnosis not present

## 2016-04-20 DIAGNOSIS — D649 Anemia, unspecified: Secondary | ICD-10-CM | POA: Diagnosis not present

## 2016-04-20 DIAGNOSIS — Z86718 Personal history of other venous thrombosis and embolism: Secondary | ICD-10-CM | POA: Diagnosis not present

## 2016-04-20 DIAGNOSIS — R0600 Dyspnea, unspecified: Secondary | ICD-10-CM

## 2016-04-20 DIAGNOSIS — I10 Essential (primary) hypertension: Secondary | ICD-10-CM

## 2016-04-20 DIAGNOSIS — I2510 Atherosclerotic heart disease of native coronary artery without angina pectoris: Secondary | ICD-10-CM

## 2016-04-20 DIAGNOSIS — E785 Hyperlipidemia, unspecified: Secondary | ICD-10-CM

## 2016-04-20 DIAGNOSIS — R0609 Other forms of dyspnea: Secondary | ICD-10-CM

## 2016-04-20 DIAGNOSIS — I119 Hypertensive heart disease without heart failure: Secondary | ICD-10-CM

## 2016-04-20 MED ORDER — FUROSEMIDE 20 MG PO TABS: ORAL_TABLET | ORAL | Status: DC

## 2016-04-20 NOTE — Telephone Encounter (Signed)
Left a message for the pt to call back.  

## 2016-04-20 NOTE — Telephone Encounter (Signed)
left msg confirming  sept appt

## 2016-04-20 NOTE — Telephone Encounter (Signed)
-----   Message from Dorothy Spark, MD sent at 04/17/2016  7:08 PM EDT ----- Improved Crea, I would keep holding lasix 20 mg po daily and use it just PRN for DOE and LE edema, also when weight > 3 lbs overnight or 5 lbs in 1 week

## 2016-04-20 NOTE — Telephone Encounter (Signed)
Fu  Pt returning RN phone call- lab results. Please call back and discuss.   

## 2016-04-20 NOTE — Telephone Encounter (Signed)
Notified the pt that per Dr Meda Coffee, his labs showed that his creatinine has improved, and she recommends that we continue to hold his scheduled lasix 20 mg po daily, and use if as lasix 20 mg po daily PRN for DOE, LEE, weight gain > 3 lbs overnight or 5 lbs in 1 week.  Confirmed the pharmacy of choice with the pt.  Pt verbalized understanding of instructions given, with verbal read back.  Pt agrees with this plan. Pt states he never picked up his scheduled dose of lasix, for he held it as instructed by Dr Meda Coffee, but now he Joah go and pick this up and use it only as needed.

## 2016-04-21 NOTE — Progress Notes (Signed)
Marland Kitchen    HEMATOLOGY/ONCOLOGY CONSULTATION NOTE  Date of Service: 04/20/2016  Patient Care Team: Mariel Aloe, MD as PCP - General Carlena Bjornstad, MD as Consulting Physician (Cardiology)  CHIEF COMPLAINTS/PURPOSE OF CONSULTATION: abrnormal gallbladder/liver imaging  DIAGNOSIS 1)  Macrocytosis without significant anemia 2) ? Gallbladder tumor -adenomyosis versus tumor. Intrahepatic biliary dilatation of unclear etiology.   INTERVAL HISTORY  Craig Moore is here for f/u of his abdominal imaging studies and blood tests. He recently had a CT of the abdomen to follow-up of his gallbladder and liver pathology. 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 Labs show stable liver function tests. His anemia has resolved and his microcytosis has improved with B complex replacement. I counseled him about the need for decrease alcohol use. He continues to smoke and I counseled him about smoking cessation as well.  MEDICAL HISTORY:  Past Medical History  Diagnosis Date  . Hyperlipidemia   . Hypertension   . Myocardial infarction ([place])   . CAD (coronary artery disease)     Non-STEMI May, 2006, bare-metal stent  //   nuclear, January, 2012, EF 70%, no ischemia  . Ejection fraction     EF 55%, echo, October, 2006, technically difficult  //  EF 70%, nuclear, January, 2012  . Tobacco abuse   . Pre-syncope     ER, January, 2011, dehydration  . DVT (deep venous thrombosis) ([place])     December, 2011, Coumadin therapy  . Groin pain     June, 2013  . 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 Coronary angioplasty  2006    SOCIAL HISTORY: Social History   Social History  . Marital Status: Legally Separated    Spouse Name: N/A  .  Number of Children: N/A  . Years of Education: N/A   Occupational History  . Not on file.   Social History Main Topics  . Smoking status: Current Every Day Smoker -- 0.25 packs/day    Types: Cigarettes  . Smokeless tobacco: Not on file  . Alcohol Use: 4.8 oz/week    8 Glasses of wine per week  . Drug Use: No  . Sexual Activity: Not Currently   Other Topics Concern  . Not on file   Social History Narrative    FAMILY HISTORY: Family History  Problem Relation Age of Onset  . Cancer Mother   . Heart disease Father   . Diabetes Father   . Stroke Father   . Breast cancer Sister     ALLERGIES:  has No Known Allergies.  MEDICATIONS:  Current Outpatient Prescriptions  Medication Sig Dispense Refill  . aspirin EC 81 MG tablet Take 81 mg by mouth daily.    .Marland Kitchenatorvastatin (LIPITOR) 20 MG tablet Take 1 tablet (20 mg total) by mouth daily at 6 PM. 90 tablet 1  . b complex vitamins capsule Take 1 capsule by mouth daily.    .Marland Kitchenlisinopril (PRINIVIL,ZESTRIL) 20 MG tablet Take 1 tablet (20 mg total) by mouth daily. 30 tablet 9  . metoprolol succinate (TOPROL-XL) 50 MG 24 hr tablet TAKE 1 TABLET (50 MG TOTAL) BY MOUTH DAILY 90 tablet 2  . rivaroxaban (XARELTO) 20 MG TABS tablet Take 1 tablet (20 mg total) by mouth  daily with supper. 90 tablet 3  . sildenafil (VIAGRA) 100 MG tablet Take 0.5 tablets (50 mg total) by mouth daily as needed for erectile dysfunction. 10 tablet 11  . furosemide (LASIX) 20 MG tablet Take 1 tab (20 mg total) by mouth daily as needed for DOE, SOB, edema, or weight gain > 3lbs overnight or 5lbs in 1 week. 90 tablet 11   No current facility-administered medications for this visit.    REVIEW OF SYSTEMS:    10 Point review of Systems was done is negative except as noted above.  PHYSICAL EXAMINATION: ECOG PERFORMANCE STATUS: 1 - Symptomatic but completely ambulatory  . Filed Vitals:   04/20/16 1301  Height: '5\' 10"'$  (1.778 m)  Weight: 165 lb (74.844 kg)    Filed Weights   04/20/16 1301  Weight: 165 lb (74.844 kg)   .Body mass index is 23.68 kg/(m^2).  GENERAL:alert, in no acute distress and comfortable SKIN: skin color, texture, turgor are normal, no rashes or significant lesions EYES: normal, conjunctiva are pink and non-injected, sclera clear OROPHARYNX:no exudate, no erythema and lips, buccal mucosa, and tongue normal  NECK: supple, no JVD, thyroid normal size, non-tender, without nodularity LYMPH:  no palpable lymphadenopathy in the cervical, axillary or inguinal LUNGS: clear to auscultation with normal respiratory effort HEART: regular rate & rhythm,  no murmurs and no lower extremity edema ABDOMEN: abdomen soft, non-tender, normoactive bowel sounds, noted to have mild hepatomegaly with some discomfort on palpation. Musculoskeletal: no cyanosis of digits and no clubbing  PSYCH: alert & oriented x 3 with fluent speech NEURO: no focal motor/sensory deficits  LABORATORY DATA:  I have reviewed the data as listed  . CBC Latest Ref Rng 04/13/2016 09/19/2015 08/19/2015  WBC 4.0 - 10.3 10e3/uL 5.0 5.1 5.3  Hemoglobin 13.0 - 17.1 g/dL 13.3 12.3(L) 11.9(L)  Hematocrit 38.4 - 49.9 % 38.9 36.1(L) 35.2(L)  Platelets 140 - 400 10e3/uL 181 208 246   . CBC    Component Value Date/Time   WBC 5.0 04/13/2016 1304   WBC 5.3 08/19/2015 1508   RBC 3.76* 04/13/2016 1304   RBC 2.89* 08/19/2015 1508   HGB 13.3 04/13/2016 1304   HGB 11.9* 08/19/2015 1508   HCT 38.9 04/13/2016 1304   HCT 35.2* 08/19/2015 1508   PLT 181 04/13/2016 1304   PLT 246 08/19/2015 1508   MCV 103.5* 04/13/2016 1304   MCV 121.8* 08/19/2015 1508   MCH 35.4* 04/13/2016 1304   MCH 41.2* 08/19/2015 1508   MCHC 34.2 04/13/2016 1304   MCHC 33.8 08/19/2015 1508   RDW 12.6 04/13/2016 1304   RDW 14.4 08/19/2015 1508   LYMPHSABS 1.5 04/13/2016 1304   LYMPHSABS 1.2 08/19/2015 1508   MONOABS 0.5 04/13/2016 1304   MONOABS 0.5 08/19/2015 1508   EOSABS 0.6* 04/13/2016 1304    EOSABS 0.2 08/19/2015 1508   BASOSABS 0.0 04/13/2016 1304   BASOSABS 0.0 08/19/2015 1508   . Lab Results  Component Value Date   RETICCTPCT 1.42 04/13/2016   RBC 3.76* 04/13/2016   RETICCTABS 53.39 04/13/2016    . Lab Results  Component Value Date   LDH 185 09/19/2015    . Lab Results  Component Value Date   TOTALPROTELP 6.9 09/19/2015   ALBUMINELP 3.5* 09/19/2015   A1GS 0.4* 09/19/2015   A2GS 0.8 09/19/2015   BETS 0.4 09/19/2015   BETA2SER 0.5 09/19/2015   GAMS 1.2 09/19/2015   SPEI * 09/19/2015   (this displays SPEP labs)  No results found for: KPAFRELGTCHN, LAMBDASER,  KAPLAMBRATIO (kappa/lambda light chains)    CMP Latest Ref Rng 04/17/2016 04/13/2016 04/09/2016  Glucose 65 - 99 mg/dL 88 89 86  BUN 7 - 25 mg/dL 14 14.8 15  Creatinine 0.70 - 1.18 mg/dL 1.71(H) 1.8(H) 1.66(H)  Sodium 135 - 146 mmol/L 133(L) 135(L) 134(L)  Potassium 3.5 - 5.3 mmol/L 4.7 5.4(H) 5.5(H)  Chloride 98 - 110 mmol/L 101 - 103  CO2 20 - 31 mmol/L '26 27 20  '$ Calcium 8.6 - 10.3 mg/dL 8.8 9.1 8.8  Total Protein 6.4 - 8.3 g/dL - 7.7 7.2  Total Bilirubin 0.20 - 1.20 mg/dL - 0.72 0.8  Alkaline Phos 40 - 150 U/L - 103 97  AST 5 - 34 U/L - 19 23  ALT 0 - 55 U/L - 14 13     RADIOGRAPHIC STUDIES: I have personally reviewed the radiological images as listed and agreed with the findings in the report.  [place] abd 09/26/2015: IMPRESSION: 1. Abnormal focus involving the gallbladder wall near the fundus. Possible unusual manifestation of adenomyomatosis, but a gallbladder malignancy cannot be excluded. Recommend CT of the abdomen without and with IV contrast versus Craig. 2. Prominent intrahepatic ducts. Again CT of the abdomen or MRI with Craig CP would be helpful to assess for a possible point of obstruction. 3. Single small gallstone. 4. Possible single 5 mm nonobstructing left renal calculus.   Electronically Signed  By: Ivar Drape M.D.  On: 09/26/2015 14:11   Ct Abdomen Pelvis W  Contrast  04/13/2016  CLINICAL DATA:  Macrocytosis.  Gallbladder mass and/or adenomyosis EXAM: CT ABDOMEN AND PELVIS WITH CONTRAST TECHNIQUE: Multidetector CT imaging of the abdomen and pelvis was performed using the standard protocol following bolus administration of intravenous contrast. CONTRAST:  65m ISOVUE-300 IOPAMIDOL (ISOVUE-300) INJECTION 61% COMPARISON:  Multiple exams, including 10/11/2015 FINDINGS: Lower chest:  Ectatic ascending aorta, 3.9 cm on image 1/2. Coronary atherosclerosis. Bilateral airway thickening with scarring or subsegmental atelectasis in both lower lobes medially. Hepatobiliary: Proximal intrahepatic biliary dilatation similar prior. No extrahepatic biliary dilatation observed. The gallbladder appears contracted with wall thickening and multifocal calcification along the fundal wall as shown on images 49-54 of series 603. There is a punctate 1-2 mm calcification in the vicinity of the ampullary wall on image 36/2. However, this appears to be along the wall rather than within the lumen itself on image 62/602. Pancreas: 4 mm hypodense lesion in the uncinate process, image 34/2, potentially a small cyst or dilated ventral pancreatic duct segment. Otherwise unremarkable. Spleen: Unremarkable Adrenals/Urinary Tract: Bilateral renal cysts and renal scarring. 3 mm left kidney upper pole nonobstructive renal calculus. No hydronephrosis or hydroureter. Urinary bladder unremarkable. Stomach/Bowel: The colonic diverticulosis most concentrated in the sigmoid colon but involving other segments of the colon as well. No active diverticulitis identified. Appendix normal. Vascular/Lymphatic: Aortoiliac atherosclerotic vascular disease. No pathologic adenopathy. 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 This area previously measured 1.5 cm on 05/30/2012. Reproductive:  Unremarkable Other: No supplemental non-categorized findings. Musculoskeletal: Lumbar spondylosis and degenerative disc disease causing multilevel impingement most notably at L3-4 and L4-5. Small umbilical hernia containing adipose tissue. Degenerative arthritis of both hips, right greater the left. IMPRESSION: 1. Today's CT shows that the focal wall thickening along the fundus of the gallbladder associated with several calcifications. These were also visible on the prior ultrasound as acoustic shadowing. Focal adenomyosis could certainly have this appearance but can  be difficult to differentiate from gallbladder malignancy. PET-CT is often not very useful as adenomyosis can be hypermetabolic as well. Cholecystectomy is not unreasonable in this circumstance due to the difficulty in separating focal adenomyosis from gallbladder carcinoma. That said, there has not been significant progression over the past 7 months to further suggest malignancy. If cholecystectomy is not undertaken, I would recommend periodic surveillance imaging, consider a 3 to six-month follow up. 2. Proximal intrahepatic biliary dilatation without extrahepatic biliary dilatation, possibly a manifestation of type V choledochal cysts. 3. Ectatic ascending aorta with coronary atherosclerosis. 4. Bilateral lower lobe airway thickening. 5. The colonic diverticulosis.  Colonic diverticulosis. 6. 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 7. Lumbar spondylosis and degenerative disc disease causing multilevel impingement most notable at L3-4 and L4-5. 8. Degenerative arthritis of the hips. Electronically Signed   By: Van Clines M.D.   On: 04/13/2016 17:15    ASSESSMENT & PLAN:   75 year old African American male with  #1Macrocytosis without significant anemia.  No significant reticulocytosis noted.  Concern for heavier alcohol use than the patient reports. Cannot rule out  underlying liver disease.  Normal RDW makes a nutritional deficiency less likely.  B12 and folate low normal.  Cannot rule out other B vitamin deficiencies in the setting of alcohol use such as thiamine  Responsive macrocytosis. Patient's anemia has resolved. His macrocytosis has improved from MCV of 121 down to 103. This likely represents correction of B vitamin deficiencies with B complex replacement or decreased alcohol use. I discussed these results with the patient.  #2 mild splenomegaly.  We ordered and had an ultrasound of the abdomen done which shows an abnormal gallbladder findings concerning for possible gallbladder tumor.  He was also noted to have intrahepatic biliary dilatation of unclear etiology. Patient subsequent head MRI/MRCP of the abdomen with suggests that the gallbladder lesion appears nonneoplastic with no enhancement. Possibly was postinflammatory scarring versus atypical adenomyosis. His left hepatic lobe intrahepatic biliary ectasia appears to be stable from a previous CT scan done in 2013 which is reassuring. CT abdomen and pelvis on 04/13/2016 shows focal wall thickening along the fundus of the gallbladder associated with several calcifications. Focal adenomyosis versus possible gallbladder carcinoma. However in the absence of change or growth in the last 6-7 months this is less likely to suggest malignancy at this time.  #3 history of DVT. On chronic anticoagulation  #3 acute renal insufficiency on chronic kidney disease likely related to dehydration from Lasix and use of ACE inhibitor with recently increased dose. Plan -Management of renal insufficiency with primary care physician. -Would avoid further IV contrast exposures. -Discussed findings of his recent CT scan. -Patient was offered the option of a surgical consultation to consider a laparoscopic cholecystectomy for diagnostic and therapeutic reasons. He notes that he is not having any symptoms at this time and the  relative stability of imaging is reassuring. He chooses to monitor this with imaging currently and does not want to consider a cholecystectomy. -We'll repeat an ultrasound of the abdomen in 4-5 months to reassess his gallbladder lesion.  -absolute alcohol cessation - continue B complex and folic acid daily over-the-counter recommended -On ongoing anticoagulation with Rivaroxaban. Craig Moore need to monitor renal function with primary care physician and consider discontinuation of Rivaroxaban EGFR less than 30. In the elderly even GFR between 30 and 50 might have slightly increased risk of bleeding on Rivaroxaban clear data with dose adjustment is not available.  Might consider risk versus benefit of decreasing dose to 15 mg daily. -Counseled on smoking cessation again since this is an active risk factor for several things including his known coronary active disease and history of recurrent venous thromboembolism .  Return to clinic with Dr. Irene Limbo in 4 months with repeat CBC, CMP and an ultrasound of the abdomen.   All the patient's questions were answered in detail.  Total time spent 25 minutes more than 50% time on direct patient counseling and explanations and coordination of care.  . Orders Placed This Encounter  Procedures  . US Abdomen Complete    Standing Status: Future     Number of Occurrences:      Standing Expiration Date: 04/20/2017    Order Specific Question:  Reason for Exam (SYMPTOM  OR DIAGNOSIS REQUIRED)    Answer:  ?gall bladder tumor vs adenomyosis for reassessment    Order Specific Question:  Preferred imaging location?    Answer:  [facility]  . CBC & Diff and Retic    Standing Status: Future     Number of Occurrences:      Standing Expiration Date: 05/25/2017  . Comprehensive metabolic panel    Standing Status: Future     Number of Occurrences:      Standing Expiration Date: 04/20/2017       Sullivan Lone MD [place] AAHIVMS [facility] Hematology/Oncology  Physician Craig Moore  (Office):       [phone] (Work cell):  [phone] (Fax):           [phone]

## 2016-08-13 ENCOUNTER — Ambulatory Visit (HOSPITAL_COMMUNITY)
Admission: RE | Admit: 2016-08-13 | Discharge: 2016-08-13 | Disposition: A | Discharge status: Home / Self Care | Payer: Medicare Other | Source: Ambulatory Visit | Attending: Hematology | Admitting: Hematology

## 2016-08-13 DIAGNOSIS — D649 Anemia, unspecified: Secondary | ICD-10-CM | POA: Diagnosis present

## 2016-08-13 DIAGNOSIS — N281 Cyst of kidney, acquired: Secondary | ICD-10-CM | POA: Diagnosis not present

## 2016-08-13 DIAGNOSIS — K829 Disease of gallbladder, unspecified: Secondary | ICD-10-CM | POA: Diagnosis not present

## 2016-08-13 DIAGNOSIS — N200 Calculus of kidney: Secondary | ICD-10-CM | POA: Insufficient documentation

## 2016-08-13 DIAGNOSIS — K828 Other specified diseases of gallbladder: Secondary | ICD-10-CM | POA: Diagnosis not present

## 2016-08-14 ENCOUNTER — Other Ambulatory Visit: Payer: Self-pay | Admitting: Cardiology

## 2016-08-21 ENCOUNTER — Other Ambulatory Visit (HOSPITAL_BASED_OUTPATIENT_CLINIC_OR_DEPARTMENT_OTHER): Payer: Medicare Other

## 2016-08-21 ENCOUNTER — Encounter: Payer: Self-pay | Admitting: Hematology

## 2016-08-21 ENCOUNTER — Ambulatory Visit (HOSPITAL_BASED_OUTPATIENT_CLINIC_OR_DEPARTMENT_OTHER): Payer: Medicare Other | Admitting: Hematology

## 2016-08-21 VITALS — BP 139/81 | HR 67 | Temp 98.5°F | Resp 18 | Ht 70.0 in | Wt 164.8 lb

## 2016-08-21 DIAGNOSIS — I82409 Acute embolism and thrombosis of unspecified deep veins of unspecified lower extremity: Secondary | ICD-10-CM

## 2016-08-21 DIAGNOSIS — D7589 Other specified diseases of blood and blood-forming organs: Secondary | ICD-10-CM

## 2016-08-21 DIAGNOSIS — R161 Splenomegaly, not elsewhere classified: Secondary | ICD-10-CM | POA: Diagnosis not present

## 2016-08-21 DIAGNOSIS — E538 Deficiency of other specified B group vitamins: Secondary | ICD-10-CM | POA: Diagnosis not present

## 2016-08-21 DIAGNOSIS — K829 Disease of gallbladder, unspecified: Secondary | ICD-10-CM

## 2016-08-21 DIAGNOSIS — D649 Anemia, unspecified: Secondary | ICD-10-CM

## 2016-08-21 DIAGNOSIS — Z86718 Personal history of other venous thrombosis and embolism: Secondary | ICD-10-CM

## 2016-08-21 LAB — CBC & DIFF AND RETIC
BASO%: 0.5 % (ref 0.0–2.0)
BASOS ABS: 0 10*3/uL (ref 0.0–0.1)
EOS ABS: 0.6 10*3/uL — AB (ref 0.0–0.5)
EOS%: 12.9 % — ABNORMAL HIGH (ref 0.0–7.0)
HCT: 36.6 % — ABNORMAL LOW (ref 38.4–49.9)
HEMOGLOBIN: 12.5 g/dL — AB (ref 13.0–17.1)
IMMATURE RETIC FRACT: 10.1 % (ref 3.00–10.60)
LYMPH#: 1.2 10*3/uL (ref 0.9–3.3)
LYMPH%: 27.6 % (ref 14.0–49.0)
MCH: 35.4 pg — ABNORMAL HIGH (ref 27.2–33.4)
MCHC: 34.2 g/dL (ref 32.0–36.0)
MCV: 103.7 fL — ABNORMAL HIGH (ref 79.3–98.0)
MONO#: 0.4 10*3/uL (ref 0.1–0.9)
MONO%: 9 % (ref 0.0–14.0)
NEUT#: 2.2 10*3/uL (ref 1.5–6.5)
NEUT%: 50 % (ref 39.0–75.0)
NRBC: 0 % (ref 0–0)
Platelets: 181 10*3/uL (ref 140–400)
RBC: 3.53 10*6/uL — ABNORMAL LOW (ref 4.20–5.82)
RDW: 12.5 % (ref 11.0–14.6)
RETIC %: 1.75 % (ref 0.80–1.80)
RETIC CT ABS: 61.78 10*3/uL (ref 34.80–93.90)
WBC: 4.4 10*3/uL (ref 4.0–10.3)

## 2016-08-21 LAB — COMPREHENSIVE METABOLIC PANEL
ALT: 14 U/L (ref 0–55)
ANION GAP: 8 meq/L (ref 3–11)
AST: 20 U/L (ref 5–34)
Albumin: 3.6 g/dL (ref 3.5–5.0)
Alkaline Phosphatase: 144 U/L (ref 40–150)
BUN: 12.4 mg/dL (ref 7.0–26.0)
CALCIUM: 8.9 mg/dL (ref 8.4–10.4)
CHLORIDE: 106 meq/L (ref 98–109)
CO2: 22 meq/L (ref 22–29)
Creatinine: 1.7 mg/dL — ABNORMAL HIGH (ref 0.7–1.3)
EGFR: 47 mL/min/{1.73_m2} — ABNORMAL LOW (ref >90)
Glucose: 100 mg/dl (ref 70–140)
POTASSIUM: 5.2 meq/L — AB (ref 3.5–5.1)
SODIUM: 136 meq/L (ref 136–145)
Total Bilirubin: 0.71 mg/dL (ref 0.20–1.20)
Total Protein: 7.6 g/dL (ref 6.4–8.3)

## 2016-08-22 NOTE — Progress Notes (Signed)
Marland Kitchen    HEMATOLOGY/ONCOLOGY CONSULTATION NOTE  Date of Service: 08/21/2016  Patient Care Team: Eloise Levels, MD as PCP - General Carlena Bjornstad, MD as Consulting Physician (Cardiology)  CHIEF COMPLAINTS/PURPOSE OF CONSULTATION: abrnormal gallbladder/liver imaging  DIAGNOSIS 1)  Macrocytosis without significant anemia 2) ? Gallbladder tumor -adenomyosis versus tumor. Intrahepatic biliary dilatation of unclear etiology. 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 He notes no acute new symptoms.  No abdominal pain, nausea, vomiting or change in weight.  His followup ultrasound done recently shows no significant change in the gallbladder findings.  He continues to drink 2-3 glasses of wine every day.  Continues to smoke.  Notes that he is enjoying watching football and playing fantasy football.  He notes that he is happy with his quality of life. He would like to continue followup with his primary care physician for continued monitoring of his gallbladder pathology which we discussed was quite reasonable.    MEDICAL HISTORY:  Past Medical History:  Diagnosis Date  . CAD (coronary artery disease)    Non-STEMI May, 2006, bare-metal stent  //   nuclear, January, 2012, EF 70%, no ischemia  . CHF (congestive heart failure) ([place])   . DVT (deep venous thrombosis) ([place])    December, 2011, Coumadin therapy  . Ejection fraction    EF 55%, echo, October, 2006, technically difficult  //  EF 70%, nuclear, January, 2012  . Groin pain    June, 2013  . Hyperlipidemia   . Hypertension   . Myocardial infarction ([place])   . Pre-syncope    ER, January, 2011, dehydration  . 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 CORONARY ANGIOPLASTY  2006    SOCIAL HISTORY: Social  History   Social History  . Marital status: Legally Separated    Spouse name: N/A  . Number of children: N/A  . Years of education: N/A   Occupational History  . Not on file.   Social History Main Topics  . Smoking status: Current Every Day Smoker    Packs/day: 0.25    Types: Cigarettes  . Smokeless tobacco: Never Used  . Alcohol use 4.8 oz/week    8 Glasses of wine per week  . Drug use: No  . Sexual activity: Not Currently   Other Topics Concern  . Not on file   Social History Narrative  . No narrative on file    FAMILY HISTORY: Family History  Problem Relation Age of Onset  . Cancer Mother   . Heart disease Father   . Diabetes Father   . Stroke Father   . Breast cancer Sister     ALLERGIES:  has No Known Allergies.  MEDICATIONS:  Current Outpatient Prescriptions  Medication Sig Dispense Refill  . aspirin EC 81 MG tablet Take 81 mg by mouth daily.    .Marland Kitchenatorvastatin (LIPITOR) 20 MG tablet TAKE 1 TABLET (20 MG TOTAL) BY MOUTH DAILY AT 6 PM. 90 tablet 2  . b complex vitamins capsule Take 1 capsule by mouth daily.    . furosemide (LASIX) 20 MG tablet Take 1 tab (20 mg total) by mouth daily as needed for DOE, SOB, edema, or weight gain > 3lbs overnight or 5lbs in 1 week. 90 tablet 11  . lisinopril (  PRINIVIL,ZESTRIL) 20 MG tablet Take 1 tablet (20 mg total) by mouth daily. 30 tablet 9  . metoprolol succinate (TOPROL-XL) 50 MG 24 hr tablet TAKE 1 TABLET (50 MG TOTAL) BY MOUTH DAILY 90 tablet 2  . rivaroxaban (XARELTO) 20 MG TABS tablet Take 1 tablet (20 mg total) by mouth daily with supper. 90 tablet 3  . sildenafil (VIAGRA) 100 MG tablet Take 0.5 tablets (50 mg total) by mouth daily as needed for erectile dysfunction. 10 tablet 11   No current facility-administered medications for this visit.     REVIEW OF SYSTEMS:    10 Point review of Systems was done is negative except as noted above.  PHYSICAL EXAMINATION: ECOG PERFORMANCE STATUS: 1 - Symptomatic but  completely ambulatory  . Vitals:   08/21/16 1149  Weight: 164 lb 12.8 oz (74.8 kg)  Height: 5\' 10"  (1.778 m)   Filed Weights   08/21/16 1149  Weight: 164 lb 12.8 oz (74.8 kg)   .Body mass index is 23.65 kg/m.  GENERAL:alert, in no acute distress and comfortable SKIN: skin color, texture, turgor are normal, no rashes or significant lesions EYES: normal, conjunctiva are pink and non-injected, sclera clear OROPHARYNX:no exudate, no erythema and lips, buccal mucosa, and tongue normal  NECK: supple, no JVD, thyroid normal size, non-tender, without nodularity LYMPH:  no palpable lymphadenopathy in the cervical, axillary or inguinal LUNGS: clear to auscultation with normal respiratory effort HEART: regular rate & rhythm,  no murmurs and no lower extremity edema ABDOMEN: abdomen soft, non-tender, normoactive bowel sounds, noted to have mild hepatomegaly with some discomfort on palpation. Musculoskeletal: no cyanosis of digits and no clubbing  PSYCH: alert & oriented x 3 with fluent speech NEURO: no focal motor/sensory deficits  LABORATORY DATA:  I have reviewed the data as listed  . CBC Latest Ref Rng & Units 08/21/2016 04/13/2016 09/19/2015  WBC 4.0 - 10.3 10e3/uL 4.4 5.0 5.1  Hemoglobin 13.0 - 17.1 g/dL 12.5(L) 13.3 12.3(L)  Hematocrit 38.4 - 49.9 % 36.6(L) 38.9 36.1(L)  Platelets 140 - 400 10e3/uL 181 181 208   . CBC    Component Value Date/Time   WBC 4.4 08/21/2016 1124   WBC 5.3 08/19/2015 1508   RBC 3.53 (L) 08/21/2016 1124   RBC 2.89 (L) 08/19/2015 1508   HGB 12.5 (L) 08/21/2016 1124   HCT 36.6 (L) 08/21/2016 1124   PLT 181 08/21/2016 1124   MCV 103.7 (H) 08/21/2016 1124   MCH 35.4 (H) 08/21/2016 1124   MCH 41.2 (H) 08/19/2015 1508   MCHC 34.2 08/21/2016 1124   MCHC 33.8 08/19/2015 1508   RDW 12.5 08/21/2016 1124   LYMPHSABS 1.2 08/21/2016 1124   MONOABS 0.4 08/21/2016 1124   EOSABS 0.6 (H) 08/21/2016 1124   BASOSABS 0.0 08/21/2016 1124   . Lab Results    Component Value Date   RETICCTPCT 1.75 08/21/2016   RBC 3.53 (L) 08/21/2016   RETICCTABS 61.78 08/21/2016    . Lab Results  Component Value Date   LDH 185 09/19/2015    . Lab Results  Component Value Date   TOTALPROTELP 6.9 09/19/2015   ALBUMINELP 3.5 (L) 09/19/2015   A1GS 0.4 (H) 09/19/2015   A2GS 0.8 09/19/2015   BETS 0.4 09/19/2015   BETA2SER 0.5 09/19/2015   GAMS 1.2 09/19/2015   SPEI * 09/19/2015   (this displays SPEP labs)  No results found for: KPAFRELGTCHN, LAMBDASER, KAPLAMBRATIO (kappa/lambda light chains)    CMP Latest Ref Rng & Units 08/21/2016 04/17/2016 04/13/2016  Glucose  70 - 140 mg/dl 100 88 89  BUN 7.0 - 26.0 mg/dL 12.4 14 14.8  Creatinine 0.7 - 1.3 mg/dL 1.7(H) 1.71(H) 1.8(H)  Sodium 136 - 145 mEq/L 136 133(L) 135(L)  Potassium 3.5 - 5.1 mEq/L 5.2(H) 4.7 5.4(H)  Chloride 98 - 110 mmol/L - 101 -  CO2 22 - 29 mEq/L '22 26 27  '$ Calcium 8.4 - 10.4 mg/dL 8.9 8.8 9.1  Total Protein 6.4 - 8.3 g/dL 7.6 - 7.7  Total Bilirubin 0.20 - 1.20 mg/dL 0.71 - 0.72  Alkaline Phos 40 - 150 U/L 144 - 103  AST 5 - 34 U/L 20 - 19  ALT 0 - 55 U/L 14 - 14     RADIOGRAPHIC STUDIES: I have personally reviewed the radiological images as listed and agreed with the findings in the report.  [place] abd 09/26/2015: IMPRESSION: 1. Abnormal focus involving the gallbladder wall near the fundus. Possible unusual manifestation of adenomyomatosis, but a gallbladder malignancy cannot be excluded. Recommend CT of the abdomen without and with IV contrast versus MR. 2. Prominent intrahepatic ducts. Again CT of the abdomen or MRI with MR CP would be helpful to assess for a possible point of obstruction. 3. Single small gallstone. 4. Possible single 5 mm nonobstructing left renal calculus.   Electronically Signed  By: Ivar Drape M.D.  On: 09/26/2015 14:11   US Abdomen Complete  Result Date: 08/13/2016 CLINICAL DATA:  Gallbladder mass and/or adenomyomatosis. EXAM: ABDOMEN  ULTRASOUND COMPLETE COMPARISON:  CT 04/13/2016.  MRI 10/11/2015.  Ultrasound 09/26/2015. FINDINGS: Gallbladder: Anterior gallbladder wall mass with echogenic foci shadowing again noted and has a similar appearance on multiple prior exams. Again differential diagnosis includes focal adenomyomatosis versus malignancy. No gallstones. Gallbladder wall thickness 2.5 mm. Negative Murphy sign. Common bile duct: Diameter: 7.9 mm. This is slightly prominent. This is increased slightly from prior examination. No obstructing abnormality identified. Liver: No focal lesion identified. Within normal limits in parenchymal echogenicity. IVC: No abnormality visualized. Pancreas: Visualized portion unremarkable. Spleen: Size and appearance within normal limits. Right Kidney: Length: 10.2 cm. Renal cortical irregularity consistent with scarring. Echogenicity within normal limits. No hydronephrosis visualized. 1.8 cm. 1.3 cm simple cyst right kidney. Left Kidney: Length: 11.0 cm. Echogenicity within normal limits. No hydronephrosis visualized. 8 mm nonobstructing calculus. Abdominal aorta: No aneurysm visualized. Other findings: None. IMPRESSION: 1. Gallbladder wall mass with associated calcifications noted on the anterior wall of the gallbladder. Similar findings noted on multiple prior exams. Again differential diagnosis includes 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 2. Gallbladder wall is slightly prominent 7.9 mm. No obstructing abnormalities identified. 3. Right renal cortical irregularity consistent with scarring. Simple cysts right kidney. 8 mm nonobstructing calculus left kidney. Electronically Signed   By: Marcello Moores  Register   On: 08/13/2016 10:49    ASSESSMENT & PLAN:   75 year old African American male with  #1 Macrocytosis without significant anemia.   Likely due to alcohol use.  Cannot rule out some underlying liver disease.   His macrocytosis has improved from MCV of 121 down to 103. This likely  represents correction of B vitamin deficiencies with B complex replacement or decreased alcohol use. I discussed these results with the patient.  #2 mild splenomegaly.  We ordered and had an ultrasound of the abdomen done which shows an abnormal gallbladder findings concerning for possible gallbladder tumor.  He was also noted to have intrahepatic biliary dilatation of unclear etiology. Patient subsequent head MRI/MRCP of the abdomen with suggests that the gallbladder lesion appears nonneoplastic with no  enhancement. Possibly was postinflammatory scarring versus atypical adenomyosis. His left hepatic lobe intrahepatic biliary ectasia appears to be stable from a previous CT scan done in 2013 which is reassuring. CT abdomen and pelvis on 04/13/2016 shows focal wall thickening along the fundus of the gallbladder associated with several calcifications. Focal adenomyosis versus possible gallbladder carcinoma. Rpt [place] Abd 08/13/2016 shows Gallbladder wall mass with associated calcifications noted on the anterior wall of the gallbladder. Similar findings noted on multiple prior exams. Again differential diagnosis includes 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  The gallbladder lesion does not seem to have changed much over the last several months.   #3 history of recurrent DVT. On chronic anticoagulation  Plan -Patient  has been offered the option of a surgical consultation to consider a laparoscopic cholecystectomy for diagnostic and therapeutic reasons on several occasions and has refused this. He notes that he is not having any symptoms at this time and the relative stability of imaging is reassuring. He chooses to monitor this with imaging currently and does not want to consider a cholecystectomy. -at this time we Maxine discharge him back to his primary care physician for continued monitoring.  -Would consider one additional repeat ultrasound of the abdomen in 6 months.  Earlier if any new  abdominal symptoms.  -We again discussed the need for minimizing alcohol use. - continue B complex and folic acid daily over-the-counter recommended -On ongoing anticoagulation with Rivaroxaban. [place] need to monitor renal function with primary care physician and consider discontinuation of Rivaroxaban EGFR less than 30. In the elderly even GFR between 30 and 50 might have slightly increased risk of bleeding on Rivaroxaban clear data with dose adjustment is not available. Might consider risk versus benefit of decreasing dose to 15 mg daily. -Counseled on smoking cessation again since this is an active risk factor for several things including his known coronary active disease and history of recurrent venous thromboembolism .  -Patient to continue to follow with Dr. Mallie Mussel his primary care physician for a followup ultrasound and other medical cares. No other new recommendations at this time.  Return to clinic with Dr. Irene Limbo as-needed basis if any new questions or concerns arise.  All the patient's questions were answered in detail.  Total time spent 25 minutes more than 50% time on direct patient counseling and explanations and coordination of care.    Sullivan Lone MD Renfrow AAHIVMS [facility] Hematology/Oncology Physician [place]  (Office):       [phone] (Work cell):  [phone] (Fax):           [phone]

## 2016-10-09 DIAGNOSIS — H25813 Combined forms of age-related cataract, bilateral: Secondary | ICD-10-CM | POA: Diagnosis not present

## 2016-10-09 DIAGNOSIS — H35039 Hypertensive retinopathy, unspecified eye: Secondary | ICD-10-CM | POA: Diagnosis not present

## 2016-11-10 ENCOUNTER — Other Ambulatory Visit: Payer: Self-pay | Admitting: Cardiology

## 2017-02-09 ENCOUNTER — Other Ambulatory Visit: Payer: Self-pay | Admitting: *Deleted

## 2017-02-09 MED ORDER — RIVAROXABAN 20 MG PO TABS: 20.0000 mg | ORAL_TABLET | Freq: Every day | ORAL | 3 refills | Status: DC

## 2017-04-01 ENCOUNTER — Other Ambulatory Visit: Payer: Self-pay | Admitting: Cardiology

## 2017-04-01 DIAGNOSIS — I10 Essential (primary) hypertension: Secondary | ICD-10-CM

## 2017-04-01 DIAGNOSIS — I25810 Atherosclerosis of coronary artery bypass graft(s) without angina pectoris: Secondary | ICD-10-CM

## 2017-05-05 ENCOUNTER — Other Ambulatory Visit: Payer: Self-pay | Admitting: Cardiology

## 2017-05-22 ENCOUNTER — Other Ambulatory Visit: Payer: Self-pay | Admitting: Cardiology

## 2017-05-31 IMAGING — CT CT ABD-PELV W/ CM
2 of 5 series · 15 of 46 positions shown, 17 images · IV contrast (ISOVUE)
Comparison: Multiple exams, including 10/11/2015

CLINICAL DATA: Macrocytosis.  Gallbladder mass and/or adenomyosis

EXAM:
CT ABDOMEN AND PELVIS WITH CONTRAST
TECHNIQUE: Multidetector CT imaging of the abdomen and pelvis was performed
using the standard protocol following bolus administration of
intravenous contrast.
CONTRAST:  80mL EPEWGY-JHH IOPAMIDOL (EPEWGY-JHH) INJECTION 61%

[Series 2: rtn a/p with · axial · 0.75mm/px · z∈[-491,-81]mm · 12 of 94 slices shown, 14 images]
[im 6/94  soft-tissue]
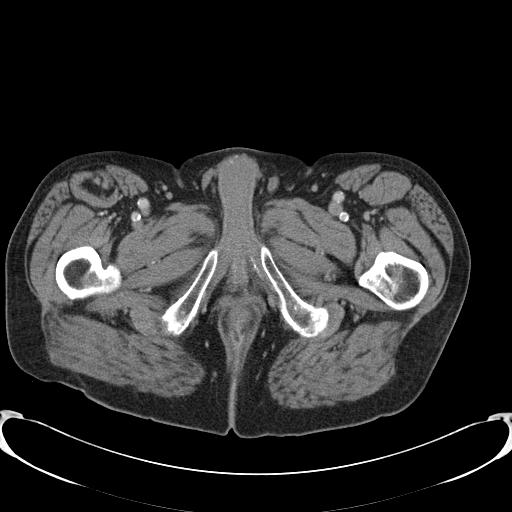
[im 6/94  bone]
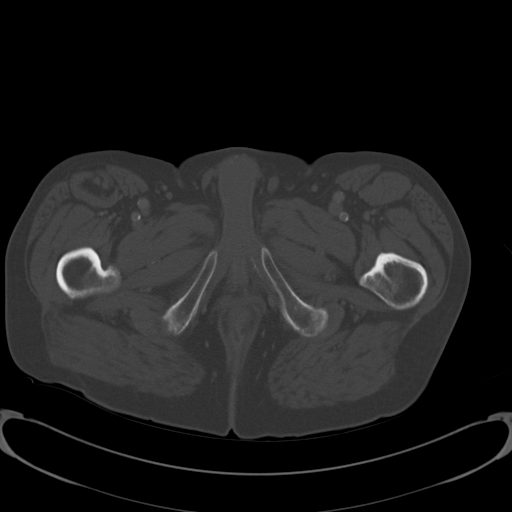
[im 12/94  soft-tissue]
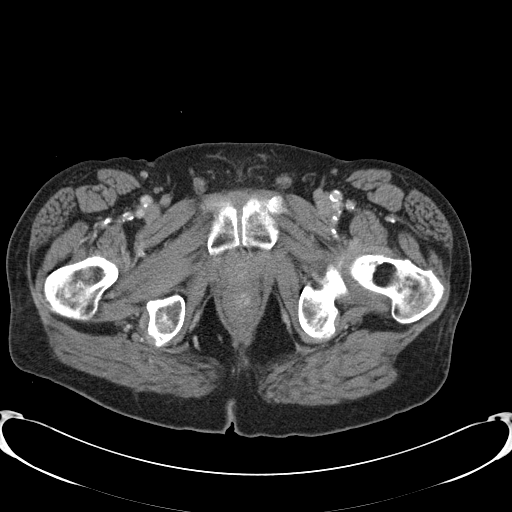
[im 24/94  soft-tissue]
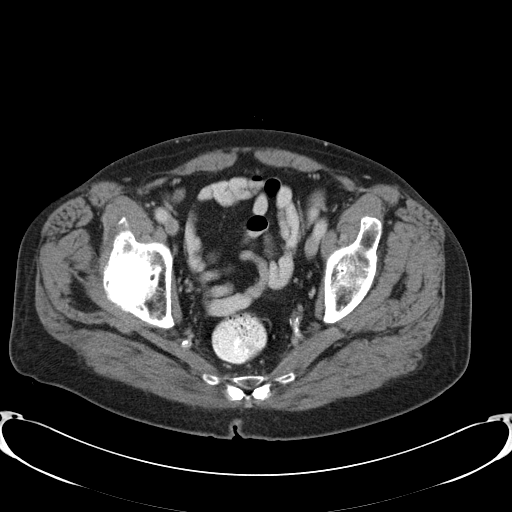
[im 30/94  soft-tissue]
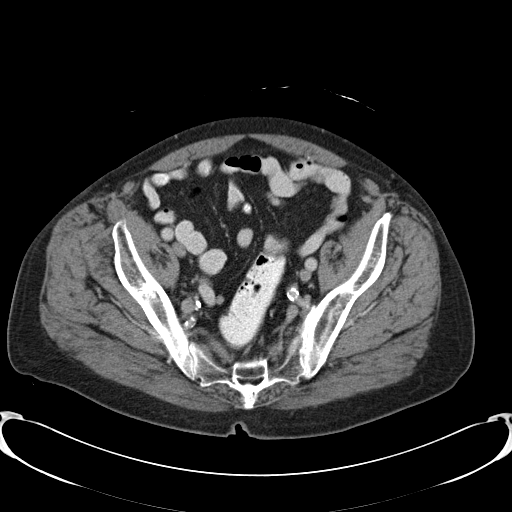
[im 35/94  soft-tissue]
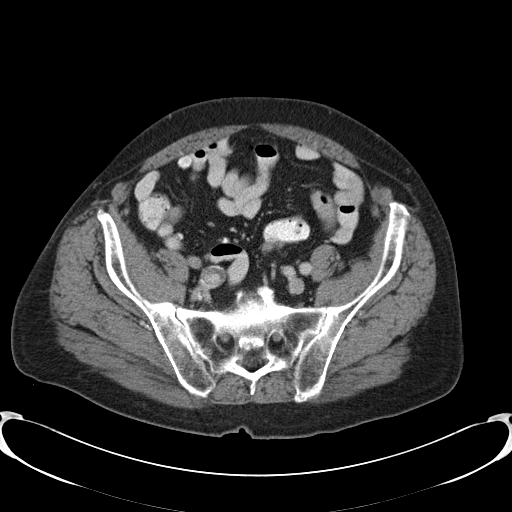
[im 41/94  soft-tissue]
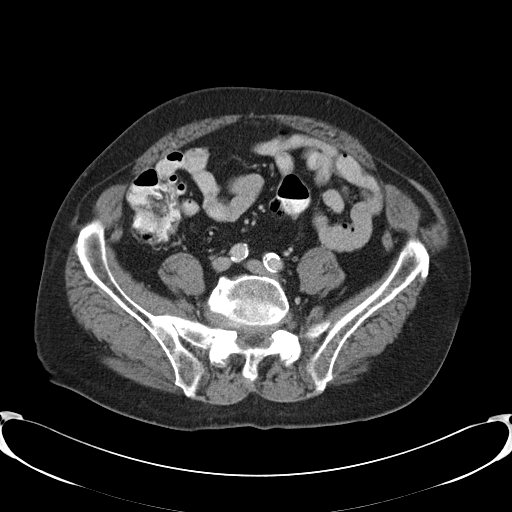
[im 53/94  soft-tissue]
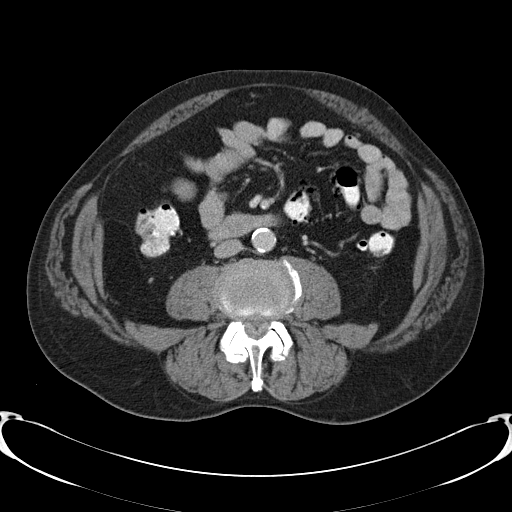
[im 59/94  soft-tissue]
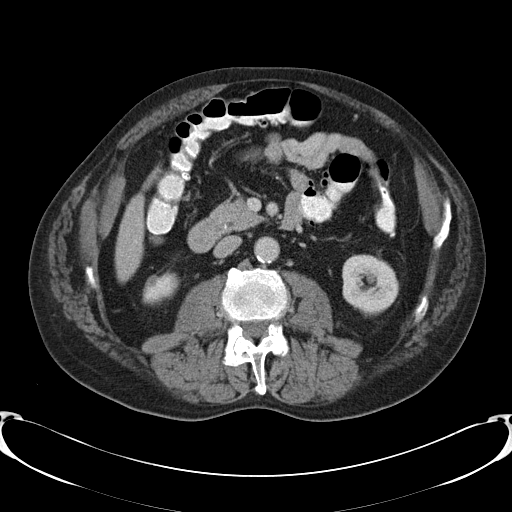
[im 64/94  soft-tissue]
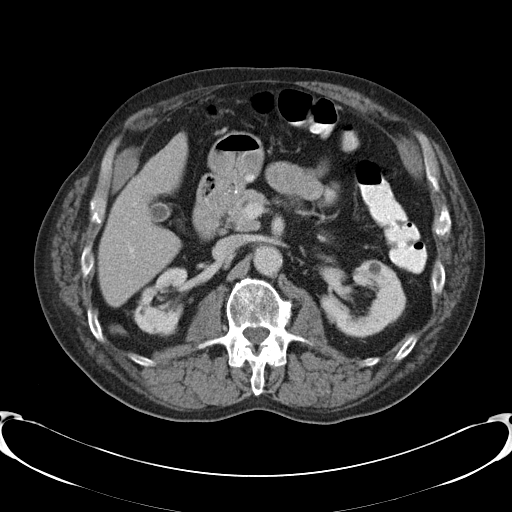
[im 64/94  bone]
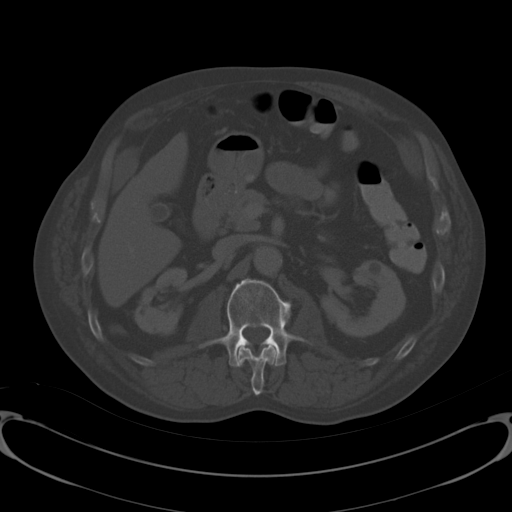
[im 70/94  soft-tissue]
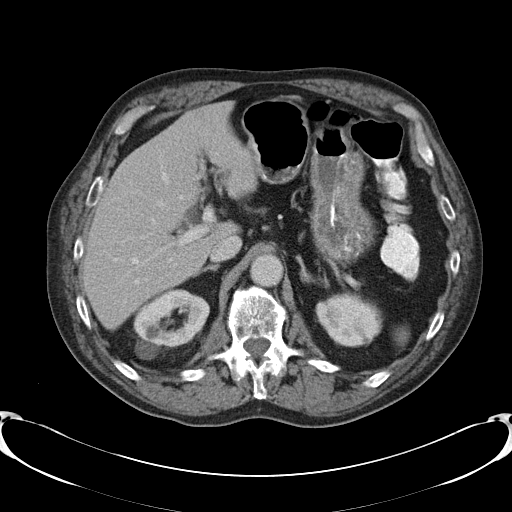
[im 82/94  soft-tissue]
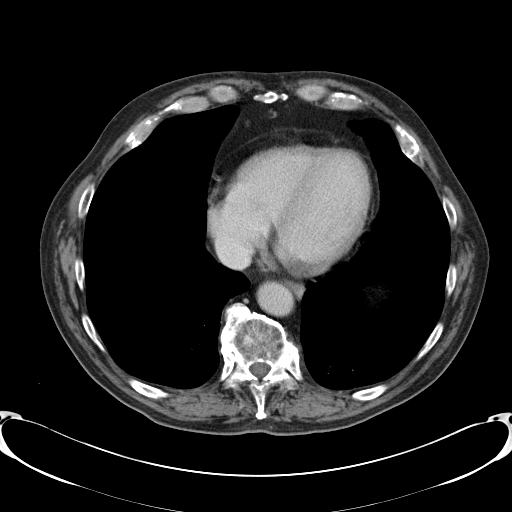
[im 88/94  soft-tissue]
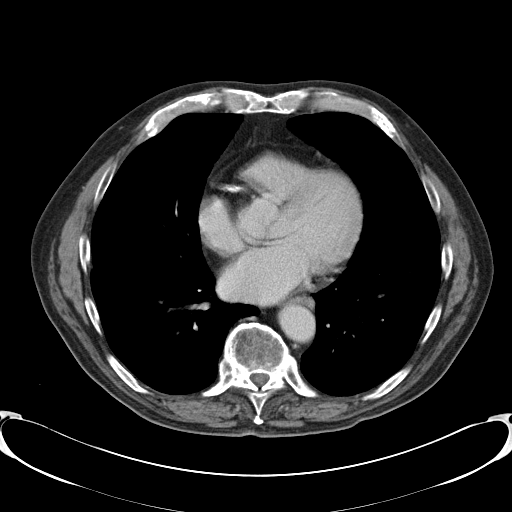

[Series 602: coronal images · coronal · 0.91mm/px · 3 of 138 slices shown]
[im 46/138  soft-tissue]
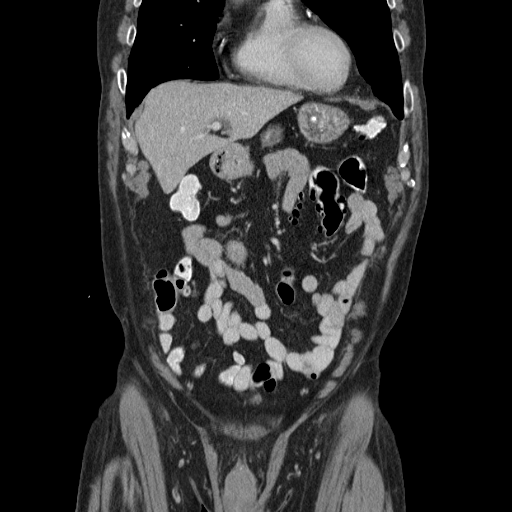
[im 61/138  soft-tissue]
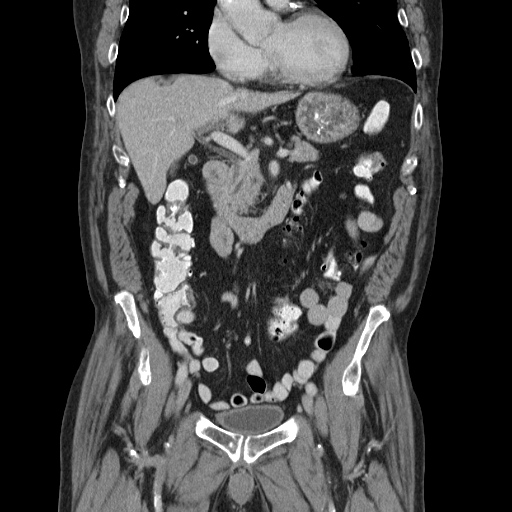
[im 77/138  soft-tissue]
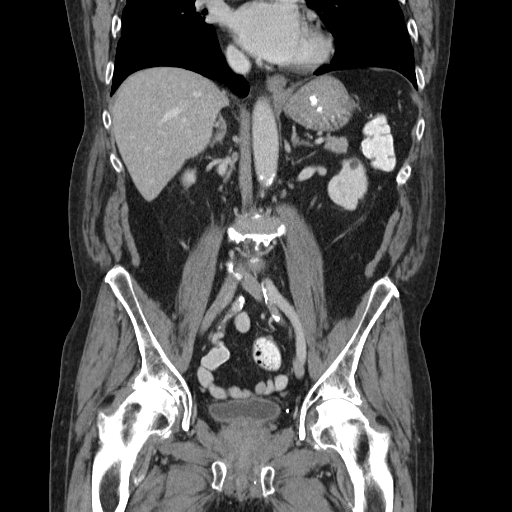

[15 of 46 positions shown; findings below may reference images not displayed]

FINDINGS: Lower chest:  Ectatic ascending aorta, 3.9 cm on image [DATE].

Coronary atherosclerosis. Bilateral airway thickening with scarring
or subsegmental atelectasis in both lower lobes medially.

Hepatobiliary: Proximal intrahepatic biliary dilatation similar
prior. No extrahepatic biliary dilatation observed.

The gallbladder appears contracted with wall thickening and
multifocal calcification along the fundal wall as shown on images
49-54 of series 603.

There is a punctate 1-2 mm calcification in the vicinity of the
ampullary wall on image 36/2. However, this appears to be along the
wall rather than within the lumen itself on image 62/602.

Pancreas: 4 mm hypodense lesion in the uncinate process, image 34/2,
potentially a small cyst or dilated ventral pancreatic duct segment.
Otherwise unremarkable.

Spleen: Unremarkable

Adrenals/Urinary Tract: Bilateral renal cysts and renal scarring. 3
mm left kidney upper pole nonobstructive renal calculus. No
hydronephrosis or hydroureter. Urinary bladder unremarkable.

Stomach/Bowel: The colonic diverticulosis most concentrated in the
sigmoid colon but involving other segments of the colon as well. No
active diverticulitis identified. Appendix normal.

Vascular/Lymphatic: Aortoiliac atherosclerotic vascular disease. No
pathologic adenopathy.

Mild aneurysmal dilatation of the right internal iliac artery just
past the bifurcation on image 60/2, 1.6 cm diameter, with some mural
thrombus which appears likely chronic. This area previously measured
1.5 cm on 05/30/2012.

Reproductive: Unremarkable

Other: No supplemental non-categorized findings.

Musculoskeletal: Lumbar spondylosis and degenerative disc disease
causing multilevel impingement most notably at L3-4 and L4-5.

Small umbilical hernia containing adipose tissue. Degenerative
arthritis of both hips, right greater the left.
IMPRESSION: 1. Today's CT shows that the focal wall thickening along the fundus
of the gallbladder associated with several calcifications. These
were also visible on the prior ultrasound as acoustic shadowing.
Focal adenomyosis could certainly have this appearance but can be
difficult to differentiate from gallbladder malignancy. PET-CT is
often not very useful as adenomyosis can be hypermetabolic as well.
Cholecystectomy is not unreasonable in this circumstance due to the
difficulty in separating focal adenomyosis from gallbladder
carcinoma. That said, there has not been significant progression
over the past 7 months to further suggest malignancy. If
cholecystectomy is not undertaken, I would recommend periodic
surveillance imaging, consider a 3 to six-month follow up.
2. Proximal intrahepatic biliary dilatation without extrahepatic
biliary dilatation, possibly a manifestation of type V choledochal
cysts.
3. Ectatic ascending aorta with coronary atherosclerosis.
4. Bilateral lower lobe airway thickening.
5. The colonic diverticulosis.  Colonic diverticulosis.
6. Mild aneurysmal dilatation of the right internal iliac artery,
1.6 cm diameter, with mural thrombus, chronic.
7. Lumbar spondylosis and degenerative disc disease causing
multilevel impingement most notable at L3-4 and L4-5.
8. Degenerative arthritis of the hips.

## 2017-06-02 ENCOUNTER — Encounter (INDEPENDENT_AMBULATORY_CARE_PROVIDER_SITE_OTHER): Payer: Self-pay

## 2017-06-02 ENCOUNTER — Ambulatory Visit (INDEPENDENT_AMBULATORY_CARE_PROVIDER_SITE_OTHER): Payer: Medicare Other | Admitting: Cardiology

## 2017-06-02 VITALS — BP 118/62 | HR 63 | Ht 70.0 in | Wt 159.0 lb

## 2017-06-02 DIAGNOSIS — I10 Essential (primary) hypertension: Secondary | ICD-10-CM | POA: Diagnosis not present

## 2017-06-02 DIAGNOSIS — R0609 Other forms of dyspnea: Secondary | ICD-10-CM

## 2017-06-02 DIAGNOSIS — E782 Mixed hyperlipidemia: Secondary | ICD-10-CM | POA: Diagnosis not present

## 2017-06-02 DIAGNOSIS — I5030 Unspecified diastolic (congestive) heart failure: Secondary | ICD-10-CM | POA: Diagnosis not present

## 2017-06-02 DIAGNOSIS — I2510 Atherosclerotic heart disease of native coronary artery without angina pectoris: Secondary | ICD-10-CM

## 2017-06-02 DIAGNOSIS — N183 Chronic kidney disease, stage 3 (moderate): Secondary | ICD-10-CM

## 2017-06-02 DIAGNOSIS — R0600 Dyspnea, unspecified: Secondary | ICD-10-CM

## 2017-06-02 DIAGNOSIS — I130 Hypertensive heart and chronic kidney disease with heart failure and stage 1 through stage 4 chronic kidney disease, or unspecified chronic kidney disease: Secondary | ICD-10-CM

## 2017-06-02 NOTE — Patient Instructions (Signed)
Medication Instructions:   Your physician recommends that you continue on your current medications as directed. Please refer to the Current Medication list given to you today.    Follow-Up:  Your physician wants you to follow-up in: ONE YEAR WITH DR NELSON [name] receive a reminder letter in the mail two months in advance. If you don't receive a letter, please call our office to schedule the follow-up appointment.        If you need a refill on your cardiac medications before your next appointment, please call your pharmacy.   

## 2017-06-02 NOTE — Progress Notes (Signed)
Patient ID: Craig Moore, male   DOB: [birth year], 76 y.o.   MRN: [id number]      Cardiology Office Note  Date:  06/02/2017   ID:  Craig Moore, DOB 27-Jun-1941, MRN [id number]  PCP:  Eloise Levels, MD  Cardiologist:  Ena Dawley, MD (previously Dr Ron Parker)  Chief complain: dyspnea on exertion  History of Present Illness: Craig Moore is a 76 y.o. male who presents for followup coronary artery disease. He received a bare-metal stent in 2006. Nuclear study in 2012 revealed no ischemia. He has good LV function. 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 He's not having any significant problems. In reviewing his medications, I see that he has been on Zetia for many years. In talking with him there is no documentation of problems with a statin in the past. His LDL had been in the range of 67. This is probably why we did not push to use a statin in the past.  04/09/2016 the patient is coming after 3 months, at the last visit he was found to be hypertensive and we adjusted his medicine, his blood pressures now controlled and he feels significantly better and denies any dyspnea on exertion. He has no chest pain, no claudication palpitations or syncope. He states that using the past he was taking furosemide and he would prefer to switch Hydrocort Dyazide to furosemide.   06/02/2017, patient is coming after one year, he has been feeling great. He continues to play 18 holes of golf 3 times a week without any shortness of breath or chest pain. He takes care of his yard including 45 minute of grass mowing without any symptoms. He's compliant with his medications with no side effects, denies any palpitations, syncope, claudications, lower extremity edema.   Past Medical History:  Diagnosis Date  . CAD (coronary artery disease)    Non-STEMI May, 2006, bare-metal stent  //   nuclear, January, 2012, EF 70%, no ischemia  . CHF (congestive heart failure) ([place])   . DVT (deep venous thrombosis)  ([place])    December, 2011, Coumadin therapy  . Ejection fraction    EF 55%, echo, October, 2006, technically difficult  //  EF 70%, nuclear, January, 2012  . Groin pain    June, 2013  . Hyperlipidemia   . Hypertension   . Myocardial infarction   . Pre-syncope    ER, January, 2011, dehydration  . Tobacco abuse    Past Surgical History:  Procedure Laterality Date  . CORONARY ANGIOPLASTY  2006   Current Outpatient Prescriptions  Medication Sig Dispense Refill  . aspirin EC 81 MG tablet Take 81 mg by mouth daily.    Marland Kitchen atorvastatin (LIPITOR) 20 MG tablet TAKE 1 TABLET (20 MG TOTAL) BY MOUTH DAILY AT 6 PM. 30 tablet 0  . b complex vitamins capsule Take 1 capsule by mouth daily.    . furosemide (LASIX) 20 MG tablet Take 1 tab (20 mg total) by mouth daily as needed for DOE, SOB, edema, or weight gain > 3lbs overnight or 5lbs in 1 week. 90 tablet 11  . lisinopril (PRINIVIL,ZESTRIL) 20 MG tablet TAKE 1 TABLET (20 MG TOTAL) BY MOUTH DAILY. 90 tablet 0  . metoprolol succinate (TOPROL-XL) 50 MG 24 hr tablet TAKE 1 TABLET BY MOUTH EVERY DAY 90 tablet 0  . rivaroxaban (XARELTO) 20 MG TABS tablet Take 1 tablet (20 mg total) by mouth daily with supper. 90 tablet 3  . sildenafil (VIAGRA) 100 MG tablet  Take 0.5 tablets (50 mg total) by mouth daily as needed for erectile dysfunction. 10 tablet 11   No current facility-administered medications for this visit.    Allergies:   Patient has no known allergies.   Social History:  The patient  reports that he has been smoking Cigarettes.  He has been smoking about 0.25 packs per day. He has never used smokeless tobacco. He reports that he drinks about 4.8 oz of alcohol per week . He reports that he does not use drugs.   Family History:  The patient's family history includes Breast cancer in his sister; Cancer in his mother; Diabetes in his father; Heart disease in his father; Stroke in his father.   ROS:  Please see the history of present illness.    Otherwise, review of systems are positive for none.   All other systems are reviewed and negative.   PHYSICAL EXAM: VS:  BP 118/62   Pulse 63   Ht 5\' 10"  (1.778 m)   Wt 159 lb (72.1 kg)   BMI 22.81 kg/m  , BMI Body mass index is 22.81 kg/m. GEN: Well nourished, well developed, in no acute distress  HEENT: normal  Neck: no JVD, carotid bruits, or masses Cardiac: RRR; no murmurs, rubs, or gallops,no edema  Respiratory:  clear to auscultation bilaterally, normal work of breathing GI: soft, nontender, nondistended, + BS MS: no deformity or atrophy  Skin: warm and dry, no rash Neuro:  Strength and sensation are intact Psych: euthymic mood, full affect  Recent Labs: 08/21/2016: ALT 14; BUN 12.4; Creatinine 1.7; HGB 12.5; Platelets 181; Potassium 5.2; Sodium 136   Lipid Panel    Component Value Date/Time   CHOL 137 08/19/2015 1508   TRIG 111 08/19/2015 1508   HDL 66 08/19/2015 1508   CHOLHDL 2.1 08/19/2015 1508   VLDL 22 08/19/2015 1508   LDLCALC 49 08/19/2015 1508   LDLDIRECT 67 01/13/2013 1527   Wt Readings from Last 3 Encounters:  06/02/17 159 lb (72.1 kg)  08/21/16 164 lb 12.8 oz (74.8 kg)  04/20/16 165 lb (74.8 kg)    EKG: Performed today 06/02/2017 was personally reviewed and shows sinus rhythm with left axis deviation and incomplete left bundle branch block, unchanged from prior.   ASSESSMENT AND PLAN:  1. Coronary artery disease - status post PCI in 2006, He is asymptomatic, therefore no ischemic workup indicated at this point. I would continue aspirin, atorvastatin, lisinopril, and metoprolol. He is encouraged to continue play golf stay active.  2. Dyspnea on exertion that resolved after his blood pressure is controlled, I would continue the same regimen.   3. Hypertensive heart disease without heart failure, blood pressure now controlled.  4. Lipids - all at goal  atorvastatin 20 mg daily. LFTs normal.  5. CK D stage III - stable creatinine of 1.7 in September  2017.  6. Smoking - 1 pack/week, not motivated to quit.  Follow-up in 1 year.  Signed, Ena Dawley, MD  06/02/2017 8:57 AM    [address], [place], [place]  [address] Phone: [phone]; Fax: [phone]

## 2017-06-22 ENCOUNTER — Other Ambulatory Visit: Payer: Self-pay | Admitting: Cardiology

## 2017-07-03 ENCOUNTER — Other Ambulatory Visit: Payer: Self-pay | Admitting: Cardiology

## 2017-07-03 DIAGNOSIS — I25810 Atherosclerosis of coronary artery bypass graft(s) without angina pectoris: Secondary | ICD-10-CM

## 2017-07-03 DIAGNOSIS — I10 Essential (primary) hypertension: Secondary | ICD-10-CM

## 2017-08-02 ENCOUNTER — Other Ambulatory Visit: Payer: Self-pay | Admitting: Cardiology

## 2017-10-27 ENCOUNTER — Other Ambulatory Visit: Payer: Self-pay | Admitting: Cardiology

## 2017-10-27 DIAGNOSIS — I10 Essential (primary) hypertension: Secondary | ICD-10-CM

## 2017-10-27 DIAGNOSIS — E785 Hyperlipidemia, unspecified: Secondary | ICD-10-CM

## 2017-10-27 DIAGNOSIS — R0609 Other forms of dyspnea: Secondary | ICD-10-CM

## 2017-10-27 DIAGNOSIS — I119 Hypertensive heart disease without heart failure: Secondary | ICD-10-CM

## 2017-10-27 DIAGNOSIS — I2510 Atherosclerotic heart disease of native coronary artery without angina pectoris: Secondary | ICD-10-CM

## 2017-10-27 DIAGNOSIS — R0600 Dyspnea, unspecified: Secondary | ICD-10-CM

## 2017-11-08 ENCOUNTER — Encounter: Payer: Self-pay | Admitting: Family Medicine

## 2017-11-08 ENCOUNTER — Ambulatory Visit (INDEPENDENT_AMBULATORY_CARE_PROVIDER_SITE_OTHER): Payer: Medicare Other | Admitting: Family Medicine

## 2017-11-08 ENCOUNTER — Other Ambulatory Visit: Payer: Self-pay

## 2017-11-08 VITALS — BP 110/60 | HR 62 | Temp 97.7°F | Ht 70.0 in | Wt 163.8 lb

## 2017-11-08 DIAGNOSIS — R739 Hyperglycemia, unspecified: Secondary | ICD-10-CM

## 2017-11-08 DIAGNOSIS — K828 Other specified diseases of gallbladder: Secondary | ICD-10-CM | POA: Diagnosis not present

## 2017-11-08 DIAGNOSIS — F17200 Nicotine dependence, unspecified, uncomplicated: Secondary | ICD-10-CM

## 2017-11-08 LAB — POCT GLYCOSYLATED HEMOGLOBIN (HGB A1C): HEMOGLOBIN A1C: 5.3

## 2017-11-08 NOTE — Patient Instructions (Signed)
Craig Moore, you were seen today for a check up. I would be thrilled if you decide to quit smoking come the new year.  I have also placed an order for you to get another ultrasound of your abdomen to make sure that your gallbladder mass has not been getting any bigger.  I feel that surgical consult would be a wise idea either way.  I Craig Moore call you after the ultrasound results are available and we can discuss the next steps.   I have also placed a gastroenterology referral for you to get a colonoscopy.  I would highly recommend that you get this done.   Please think about getting a flu shot in the future you can come back to our office at any time.   Very nice to meet you.  Take care, Craig Moore, [place] Resident PGY-2 11/08/2017 3:45 PM

## 2017-11-08 NOTE — Progress Notes (Signed)
    Subjective:  Craig Moore is a 76 y.o. male who presents to the [facility] today for checkup and to discuss his gallbladder mass  HPI: [name] is a 76 year old male presenting today for a checkup and to discuss his gallbladder mass.   Does have a history of gallbladder wall mass with associated calcifications on the anterior wall of the gallbladder.  Based on ultrasound differential included focal adenoma by EOMI ptosis versus gallbladder malignancy.  He was last seen by oncology September 2017.  At that time they recommended repeat ultrasound within 6 months and/or referral to surgery for evaluation for cholecystectomy.  At that time he had refused surgery consult and had not followed up with ultrasound. He denies any chest pain, shortness of breath, bowel or bladder changes, nausea, vomiting or diarrhea. Patient denies any abdominal symptoms, fevers, chills, loss of appetite, weight loss or pain radiating to his shoulder.     PMH: Tobacco abuse, CAD, DVT, hypertension Tobacco use: Smokes 1 pack every 3 days since he was a teenager Drinks 1-2 glasses of red wine per night Medication: reviewed and updated ROS: see HPI   Objective:  Physical Exam: BP 110/60   Pulse 62   Temp 97.7 F (36.5 C) (Oral)   Ht 5\' 10"  (1.778 m)   Wt 163 lb 12.8 oz (74.3 kg)   SpO2 99%   BMI 23.50 kg/m   Gen: 76 year old male NAD, resting comfortably CV: RRR with no murmurs appreciated Pulm: NWOB, CTAB with no crackles, wheezes, or rhonchi GI: Normal bowel sounds present. Soft, Nontender, Nondistended. MSK: no edema, cyanosis, or clubbing noted Skin: warm, dry Neuro: grossly normal, moves all extremities Psych: Normal affect and thought content  No results found for this or any previous visit (from the past 72 hour(s)).   Assessment/Plan:  Gallbladder mass Last seen by his oncologist on September 2017 who at that time recommended repeat right upper quadrant ultrasound to follow gallbladder mass.   Patient denies any abdominal pain, fever, chills, nausea or vomiting.  After long discussion follow-up imaging versus surgical consult patient wanted to move forward with ultrasound and if the mass has progressed he would agree to surgical consult.  Fortunately repeat ultrasound showed stable gallbladder mass with calcifications in the anterior gallbladder wall most consistent with adenomyomatosis and less likely to be malignancy according to radiology read.  Despite this finding it would still be reasonable to have a surgical consult in the future.  Health maintenance Patient due today for colonoscopy, Tdap and Prevnar patient has Medicare and therefore provided him with a prescription for Tdap.  He declined flu shot.  Also placed referral for colonoscopy.   Axavier Pressley L. Rosalyn Gess, [place] Resident PGY-2 11/15/2017 2:45 PM '

## 2017-11-09 LAB — CMP14+EGFR
ALT: 10 IU/L (ref 0–44)
AST: 14 IU/L (ref 0–40)
Albumin/Globulin Ratio: 1.4 (ref 1.2–2.2)
Albumin: 4.2 g/dL (ref 3.5–4.8)
Alkaline Phosphatase: 123 IU/L — ABNORMAL HIGH (ref 39–117)
BUN / CREAT RATIO: 10 (ref 10–24)
BUN: 20 mg/dL (ref 8–27)
Bilirubin Total: 0.5 mg/dL (ref 0.0–1.2)
CALCIUM: 8.9 mg/dL (ref 8.6–10.2)
CO2: 23 mmol/L (ref 20–29)
Chloride: 101 mmol/L (ref 96–106)
Creatinine, Ser: 2.03 mg/dL — ABNORMAL HIGH (ref 0.76–1.27)
GFR, EST AFRICAN AMERICAN: 36 mL/min/{1.73_m2} — AB (ref >59)
GFR, EST NON AFRICAN AMERICAN: 31 mL/min/{1.73_m2} — AB (ref >59)
GLUCOSE: 90 mg/dL (ref 65–99)
Globulin, Total: 3 g/dL (ref 1.5–4.5)
POTASSIUM: 6 mmol/L — AB (ref 3.5–5.2)
Sodium: 134 mmol/L (ref 134–144)
Total Protein: 7.2 g/dL (ref 6.0–8.5)

## 2017-11-09 LAB — CBC
HEMATOCRIT: 37.1 % — AB (ref 37.5–51.0)
Hemoglobin: 12.2 g/dL — ABNORMAL LOW (ref 13.0–17.7)
MCH: 35.1 pg — ABNORMAL HIGH (ref 26.6–33.0)
MCHC: 32.9 g/dL (ref 31.5–35.7)
MCV: 107 fL — ABNORMAL HIGH (ref 79–97)
PLATELETS: 254 10*3/uL (ref 150–379)
RBC: 3.48 x10E6/uL — ABNORMAL LOW (ref 4.14–5.80)
RDW: 13.1 % (ref 12.3–15.4)
WBC: 5.9 10*3/uL (ref 3.4–10.8)

## 2017-11-12 ENCOUNTER — Ambulatory Visit (HOSPITAL_COMMUNITY)
Admission: RE | Admit: 2017-11-12 | Discharge: 2017-11-12 | Disposition: A | Discharge status: Home / Self Care | Payer: Medicare Other | Source: Ambulatory Visit | Attending: Family Medicine | Admitting: Family Medicine

## 2017-11-12 DIAGNOSIS — K828 Other specified diseases of gallbladder: Secondary | ICD-10-CM | POA: Diagnosis not present

## 2017-11-12 DIAGNOSIS — K829 Disease of gallbladder, unspecified: Secondary | ICD-10-CM | POA: Insufficient documentation

## 2017-11-15 ENCOUNTER — Encounter: Payer: Self-pay | Admitting: Family Medicine

## 2017-11-15 NOTE — Assessment & Plan Note (Signed)
Last seen by his oncologist on September 2017 who at that time recommended repeat right upper quadrant ultrasound to follow gallbladder mass.  Patient denies any abdominal pain, fever, chills, nausea or vomiting.  After long discussion follow-up imaging versus surgical consult patient wanted to move forward with ultrasound and if the mass has progressed he would agree to surgical consult.  Fortunately repeat ultrasound showed stable gallbladder mass with calcifications in the anterior gallbladder wall most consistent with adenomyomatosis and less likely to be malignancy according to radiology read.  Despite this finding it would still be reasonable to have a surgical consult in the future.

## 2017-12-14 ENCOUNTER — Other Ambulatory Visit: Payer: Self-pay | Admitting: Cardiology

## 2017-12-14 MED ORDER — ATORVASTATIN CALCIUM 20 MG PO TABS: ORAL_TABLET | ORAL | 1 refills | Status: DC

## 2017-12-15 DIAGNOSIS — H25813 Combined forms of age-related cataract, bilateral: Secondary | ICD-10-CM | POA: Diagnosis not present

## 2018-01-03 DIAGNOSIS — H2513 Age-related nuclear cataract, bilateral: Secondary | ICD-10-CM | POA: Diagnosis not present

## 2018-01-03 DIAGNOSIS — H59211 Accidental puncture and laceration of right eye and adnexa during an ophthalmic procedure: Secondary | ICD-10-CM | POA: Diagnosis not present

## 2018-01-03 DIAGNOSIS — H25811 Combined forms of age-related cataract, right eye: Secondary | ICD-10-CM | POA: Diagnosis not present

## 2018-01-04 DIAGNOSIS — Z961 Presence of intraocular lens: Secondary | ICD-10-CM | POA: Diagnosis not present

## 2018-01-12 DIAGNOSIS — Z961 Presence of intraocular lens: Secondary | ICD-10-CM | POA: Diagnosis not present

## 2018-01-12 DIAGNOSIS — H5202 Hypermetropia, left eye: Secondary | ICD-10-CM | POA: Diagnosis not present

## 2018-01-18 DIAGNOSIS — Z961 Presence of intraocular lens: Secondary | ICD-10-CM | POA: Diagnosis not present

## 2018-01-18 DIAGNOSIS — H539 Unspecified visual disturbance: Secondary | ICD-10-CM | POA: Diagnosis not present

## 2018-02-14 ENCOUNTER — Other Ambulatory Visit: Payer: Self-pay | Admitting: Family Medicine

## 2018-02-15 DIAGNOSIS — Z961 Presence of intraocular lens: Secondary | ICD-10-CM | POA: Diagnosis not present

## 2018-05-18 DIAGNOSIS — H26491 Other secondary cataract, right eye: Secondary | ICD-10-CM | POA: Diagnosis not present

## 2018-05-22 ENCOUNTER — Other Ambulatory Visit: Payer: Self-pay | Admitting: Cardiology

## 2018-06-20 ENCOUNTER — Other Ambulatory Visit: Payer: Self-pay | Admitting: Cardiology

## 2018-06-22 ENCOUNTER — Telehealth: Payer: Self-pay | Admitting: Cardiology

## 2018-06-22 NOTE — Telephone Encounter (Signed)
Left message for patient to contact office.

## 2018-06-22 NOTE — Telephone Encounter (Signed)
   Primary Cardiologist: Dr Meda Coffee  Chart reviewed as part of pre-operative protocol coverage. Because of Karina [place] past medical history and time since last visit, he/she Jaston require a follow-up visit in order to better assess preoperative cardiovascular risk.  Pre-op covering staff: this patient has not been seen in > a year. He needs an appointment to see Dr Meda Coffee or an APP at [facility].  - Please schedule appointment and call patient to inform them. - Please contact requesting surgeon's office via preferred method (i.e, phone, fax) to inform them of need for appointment prior to surgery.  Kerin Ransom, PA-C  06/22/2018, 4:07 PM

## 2018-06-22 NOTE — Telephone Encounter (Signed)
New Message         [facility] Pre-operative Risk Assessment    Request for surgical clearance:  1. What type of surgery is being performed? 6 Tooth extractions  2. When is this surgery scheduled? July 29th   3. What type of clearance is required (medical clearance vs. Pharmacy clearance to hold med vs. Both)? Clearance of the blood thinner  4. Are there any medications that need to be held prior to surgery and how long? Sheila Oats.How long does the patient need to be off of meds, please put on letter head.   5. Practice name and name of physician performing surgery? Christopher Milsap,DDS   6. What is your office phone number [phone]   7.   What is your office fax number [phone]  8.   Anesthesia type (None, local, MAC, general) ? Local     Porfirio Mylar 06/22/2018, 2:37 PM  _________________________________________________________________   (provider comments below)

## 2018-06-24 ENCOUNTER — Other Ambulatory Visit: Payer: Self-pay | Admitting: Cardiology

## 2018-06-27 NOTE — Telephone Encounter (Signed)
I s/w pt, per Pre Op Protocol pt has been scheduled for appt. I have notified Dr. Lear Ng, [place], office pt needs appt before he can be cleared. Pt thanked me for the call and our help. I [name] route Pre OP information to [name], PA for appt on 07/11/18 @ 10:30 am.

## 2018-07-06 DIAGNOSIS — H26491 Other secondary cataract, right eye: Secondary | ICD-10-CM | POA: Diagnosis not present

## 2018-07-08 ENCOUNTER — Telehealth: Payer: Self-pay | Admitting: Physician Assistant

## 2018-07-08 ENCOUNTER — Encounter: Payer: Self-pay | Admitting: Physician Assistant

## 2018-07-08 NOTE — Telephone Encounter (Addendum)
I was precharting patient for Monday's clinic and noted that potassium level was 6.0 and Cr 2.03 by last labs 11/2017. I do not see any result note commentary on this lab - checked by family med resident Dr. Rosalyn Gess. I called patient at home to ask if he had had any follow-up. He began telling me a story about having it rechecked twice and it finally coming back normal but he was actually talking about in 04/2016. The only primary care he sees is the internal medicine teaching service but has not been back to see them back. Very difficult situation since lab is 74 months old now. Office is now closed. The safest thing is for him to go to the emergency room to have this rechecked ASAP but he completely declined saying he feels absolutely fine. He is still on lisinopril which I instructed him to stop ASAP. I also told him to avoid potassium rich food in diet. ER precautions reviewed.  On Monday our office [place] need to call PCP's office to find out what happened. Pheobe Sandiford PA-C

## 2018-07-10 NOTE — Progress Notes (Signed)
Cardiology Office Note    Date:  07/11/2018  ID:  Craig Moore, DOB 1941-09-18, MRN [id number] PCP:  George Bing, DO  Cardiologist:  Ena Dawley, MD   Chief Complaint: overdue follow-up, pre-operative evaluation  History of Present Illness:  Craig Moore is a 77 y.o. male with history of coronary artery disease (NSTEMI s/p BMS to prox LAD and mRCA in 2006 - BMS chosen over concern for compliance/EtOH), DVT 2011, HTN, HLD, tobacco abuse, CKD stage III, anemia, thrombocytopenia who presents for overdue follow-up and pre-op evaluation.  Per Dr. Francesca Oman note, has been doing well since PCI. Nuclear study in 2012 revealed no ischemia, EF 72% at that time. 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 Per her note, "in reviewing his medications, I see that he has been on Zetia for many years. In talking with him there is no documentation of problems with a statin in the past. His LDL had been in the range of 67. This is probably why we did not push to use a statin in the past." He is on atorvastatin '20mg'$ . Last labs 11/2017 show Hgb 12.2, plt 254, K 6.0 (elevated in the past as well), Cr 2.03, alk phos intermittently elevated as well. We received a clearance for the patient to undergo 6 dental extractions off Xarelto under local anesthesia. He had not been seen recently so was scheduled for an appointment. Upon pre-charting this patient's chart on Friday I did not see that the high potassium level had ever been followed up or relayed to patient. I called him and advised ER but he declined citing he felt fine. I spoke with family medicine attending today and they are looking into this. SZP also placed. I told him to stop lisinopril until further notice.  He returns for follow-up today feeling well from cardiac standpoint. No CP, SOB, dizziness, diaphoresis, syncope. Continues to smoke but has cut down over the years, drinks a few glasses of  wine 4 nights per week - discussed cutting down/cessation. 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 He is asymptomatic. BP checked personally by me was approximately 180/80 in both arms.    Past Medical History:  Diagnosis Date  . CAD (coronary artery disease)    Non-STEMI May, 2006, bare-metal stent  //   nuclear, January, 2012, EF 70%, no ischemia  . CKD (chronic kidney disease), stage III ([place])   . DVT (deep venous thrombosis) ([place])    December, 2011, Coumadin therapy  . Ejection fraction    EF 55%, echo, October, 2006, technically difficult  //  EF 70%, nuclear, January, 2012  . Groin pain    June, 2013  . Hyperlipidemia   . Hypertension   . Myocardial infarction ([place])   . Pre-syncope    ER, January, 2011, dehydration  . Tobacco abuse     Past Surgical History:  Procedure Laterality Date  . CORONARY ANGIOPLASTY  2006    Current Medications: Current Meds  Medication Sig  . aspirin EC 81 MG tablet Take 81 mg by mouth daily.  Marland Kitchen atorvastatin (LIPITOR) 20 MG tablet TAKE 1 TABLET EVERY DAY AT 6 PM. Please keep upcoming appt in October for future refills. Thank you  . b complex vitamins capsule Take 1 capsule by mouth daily.  . furosemide (LASIX) 20 MG tablet TAKE 1 TAB DAILY AS NEEDED FOR DOE, SOB, EDEMA, OR WEIGHT GAIN >3 POUNDS OVERNIGHT OR 5 POUNDS/WK  .  metoprolol succinate (TOPROL-XL) 50 MG 24 hr tablet Take 1 tablet (50 mg total) by mouth daily. Please keep 09/19/18 appointment for further refills  . sildenafil (VIAGRA) 100 MG tablet Take 0.5 tablets (50 mg total) by mouth daily as needed for erectile dysfunction.  Craig Moore 20 MG TABS tablet TAKE 1 TABLET (20 MG TOTAL) BY MOUTH DAILY WITH SUPPER.      Allergies:   Patient has no known allergies.   Social History   Socioeconomic History  . Marital status: Legally Separated    Spouse name: Not on file  . Number of children: Not on file  . Years of education: Not on file  . Highest education level: Not on  file  Occupational History  . Not on file  Social Needs  . Financial resource strain: Not on file  . Food insecurity:    Worry: Not on file    Inability: Not on file  . Transportation needs:    Medical: Not on file    Non-medical: Not on file  Tobacco Use  . Smoking status: Current Every Day Smoker    Packs/day: 0.25    Types: Cigarettes  . Smokeless tobacco: Never Used  Substance and Sexual Activity  . Alcohol use: Yes    Alcohol/week: 4.8 oz    Types: 8 Glasses of wine per week  . Drug use: No  . Sexual activity: Not Currently  Lifestyle  . Physical activity:    Days per week: Not on file    Minutes per session: Not on file  . Stress: Not on file  Relationships  . Social connections:    Talks on phone: Not on file    Gets together: Not on file    Attends religious service: Not on file    Active member of club or organization: Not on file    Attends meetings of clubs or organizations: Not on file    Relationship status: Not on file  Other Topics Concern  . Not on file  Social History Narrative  . Not on file     Family History:  The patient's family history includes Breast cancer in his sister; Cancer in his mother; Diabetes in his father; Heart disease in his father; Stroke in his father.  ROS:   Please see the history of present illness. All other systems are reviewed and otherwise negative.    PHYSICAL EXAM:   VS:  BP (!) 180/80   Pulse (!) 53   Ht '5\' 10"'$  (1.778 m)   Wt 158 lb 12.8 oz (72 kg)   SpO2 99%   BMI 22.79 kg/m   BMI: Body mass index is 22.79 kg/m. GEN: Well nourished, well developed AAM, in no acute distress HEENT: normocephalic, atraumatic, mild scleral icterus, + arcus senilis Neck: no JVD, carotid bruits, or masses Cardiac: RRR; no murmurs, rubs, or gallops, no edema  Respiratory:  clear to auscultation bilaterally, normal work of breathing GI: soft, nontender, nondistended, + BS MS: no deformity or atrophy Skin: warm and dry, no  rash Neuro:  Alert and Oriented x 3, Strength and sensation are intact, follows commands Psych: euthymic mood, full affect  Wt Readings from Last 3 Encounters:  07/11/18 158 lb 12.8 oz (72 kg)  11/08/17 163 lb 12.8 oz (74.3 kg)  06/02/17 159 lb (72.1 kg)      Studies/Labs Reviewed:   EKG:  EKG was ordered today and personally reviewed by me and demonstrates sinus bradycardia with mobitz type 1 second degree  AV block (reviewed with Dr Rayann Heman)  Recent Labs: 11/08/2017: ALT 10; BUN 20; Creatinine, Ser 2.03; Hemoglobin 12.2; Platelets 254; Potassium 6.0; Sodium 134   Lipid Panel    Component Value Date/Time   CHOL 137 08/19/2015 1508   TRIG 111 08/19/2015 1508   HDL 66 08/19/2015 1508   CHOLHDL 2.1 08/19/2015 1508   VLDL 22 08/19/2015 1508   LDLCALC 49 08/19/2015 1508   LDLDIRECT 67 01/13/2013 1527    Additional studies/ records that were reviewed today include: Summarized above.   ASSESSMENT & PLAN:   1. Pre-operative evaluation - Simple dental extractions are considered low risk procedures per guidelines and generally do not require any specific cardiac clearance. However, I do think it would be helpful for him to achieve adequate blood pressure control before moving forward. It is also generally accepted that for simple extractions, there is no need to interrupt blood thinner therapy. However, it looks like his dentist is asking for him to come off Xarelto. This is for history of recurrent DVT and we do not manage his Xarelto thus I would advise this clearance come through primary care. I have asked him to make an appointment to be seen by primary care within a week to discuss this as well as reassess blood pressure control so final clearance can be provided from a medical standpoint. I Tanveer route this note to the requesting dentist upon closing it. 2. CAD - doing well without recurrent angina. Recheck baseline CBC. No bleeding. Continue ASA, statin, BB (see below re:  metoprolol). 3. Hyperkalemia - as above. Hold lisinopril and check stat BMET today. Likely Geordie need referral to nephrology as he has not seen one before and has CKD stage III 4. Essential HTN - BP elevated today in clinic as expected sequelae of holding ACEI. We are also decreasing BB. I Adon start amlodipine '10mg'$  daily. I have recommended he get back in to see primary care within 1 week - they did call him today after I had notified them of his potassium issues to offer him an appointment. He Dabney give them a call back. 5. Hyperlipidemia - managed by Dr. Meda Coffee who has chosen not to escalate statin therapy. Recheck LFTs/lipids today. He has not eaten. 6. 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 He has had sinus bradycardia in the past as well. Per discussion with DOD Dr. Rayann Heman, [place]on reduce Toprol to '25mg'$  daily and follow. Check lytes, thyroid.  Disposition: F/u with Dr. Meda Coffee in 09/2018 as recommended.  Medication Adjustments/Labs and Tests Ordered: Current medicines are reviewed at length with the patient today.  Concerns regarding medicines are outlined above. Medication changes, Labs and Tests ordered today are summarized above and listed in the Patient Instructions accessible in Encounters.   Signed, Charlie Pitter, PA-C  07/11/2018 11:24 AM    [facility] [place], [place], [place]  [address] Phone: [phone]; Fax: [phone]

## 2018-07-11 ENCOUNTER — Encounter: Payer: Self-pay | Admitting: Physician Assistant

## 2018-07-11 ENCOUNTER — Ambulatory Visit (INDEPENDENT_AMBULATORY_CARE_PROVIDER_SITE_OTHER): Payer: Medicare Other | Admitting: Physician Assistant

## 2018-07-11 ENCOUNTER — Telehealth: Payer: Self-pay | Admitting: Family Medicine

## 2018-07-11 VITALS — BP 180/80 | HR 53 | Ht 70.0 in | Wt 158.8 lb

## 2018-07-11 DIAGNOSIS — I441 Atrioventricular block, second degree: Secondary | ICD-10-CM

## 2018-07-11 DIAGNOSIS — E785 Hyperlipidemia, unspecified: Secondary | ICD-10-CM

## 2018-07-11 DIAGNOSIS — I10 Essential (primary) hypertension: Secondary | ICD-10-CM

## 2018-07-11 DIAGNOSIS — R001 Bradycardia, unspecified: Secondary | ICD-10-CM

## 2018-07-11 DIAGNOSIS — I2510 Atherosclerotic heart disease of native coronary artery without angina pectoris: Secondary | ICD-10-CM

## 2018-07-11 DIAGNOSIS — E875 Hyperkalemia: Secondary | ICD-10-CM | POA: Diagnosis not present

## 2018-07-11 DIAGNOSIS — Z01818 Encounter for other preprocedural examination: Secondary | ICD-10-CM | POA: Diagnosis not present

## 2018-07-11 LAB — CBC
HEMATOCRIT: 36.6 % — AB (ref 37.5–51.0)
Hemoglobin: 13 g/dL (ref 13.0–17.7)
MCH: 37.8 pg — AB (ref 26.6–33.0)
MCHC: 35.5 g/dL (ref 31.5–35.7)
MCV: 106 fL — ABNORMAL HIGH (ref 79–97)
Platelets: 198 10*3/uL (ref 150–450)
RBC: 3.44 x10E6/uL — ABNORMAL LOW (ref 4.14–5.80)
RDW: 12.7 % (ref 12.3–15.4)
WBC: 3.8 10*3/uL (ref 3.4–10.8)

## 2018-07-11 LAB — HEPATIC FUNCTION PANEL
ALK PHOS: 110 IU/L (ref 39–117)
ALT: 19 IU/L (ref 0–44)
AST: 28 IU/L (ref 0–40)
Albumin: 4.1 g/dL (ref 3.5–4.8)
BILIRUBIN, DIRECT: 0.16 mg/dL (ref 0.00–0.40)
Bilirubin Total: 0.5 mg/dL (ref 0.0–1.2)
Total Protein: 6.9 g/dL (ref 6.0–8.5)

## 2018-07-11 LAB — LIPID PANEL
Chol/HDL Ratio: 2 ratio (ref 0.0–5.0)
Cholesterol, Total: 129 mg/dL (ref 100–199)
HDL: 63 mg/dL (ref >39)
LDL Calculated: 41 mg/dL (ref 0–99)
Triglycerides: 127 mg/dL (ref 0–149)
VLDL Cholesterol Cal: 25 mg/dL (ref 5–40)

## 2018-07-11 LAB — BASIC METABOLIC PANEL
BUN / CREAT RATIO: 8 — AB (ref 10–24)
BUN: 13 mg/dL (ref 8–27)
CALCIUM: 8.8 mg/dL (ref 8.6–10.2)
CO2: 23 mmol/L (ref 20–29)
Chloride: 101 mmol/L (ref 96–106)
Creatinine, Ser: 1.53 mg/dL — ABNORMAL HIGH (ref 0.76–1.27)
GFR, EST AFRICAN AMERICAN: 51 mL/min/{1.73_m2} — AB (ref >59)
GFR, EST NON AFRICAN AMERICAN: 44 mL/min/{1.73_m2} — AB (ref >59)
Glucose: 90 mg/dL (ref 65–99)
Potassium: 5.4 mmol/L — ABNORMAL HIGH (ref 3.5–5.2)
Sodium: 131 mmol/L — ABNORMAL LOW (ref 134–144)

## 2018-07-11 LAB — TSH: TSH: 0.961 u[IU]/mL (ref 0.450–4.500)

## 2018-07-11 MED ORDER — AMLODIPINE BESYLATE 10 MG PO TABS: 10.0000 mg | ORAL_TABLET | Freq: Every day | ORAL | 6 refills | Status: DC

## 2018-07-11 MED ORDER — METOPROLOL SUCCINATE ER 25 MG PO TB24: 25.0000 mg | ORAL_TABLET | Freq: Every day | ORAL | 6 refills | Status: DC

## 2018-07-11 NOTE — Telephone Encounter (Signed)
Call from [facility] regarding K+ level of 6 from Dec 2018. Patient is yet to f/u on result and had not been contacted yet regarding result. He has a f/u appointment with Cards tomorrow. We Aarit bring him in for Lab today if possible.  Case discussed with Dorcas Mcmurray as well.    I called him and offered an appointment for him to come in today for lab. However, he stated that he is already scheduled an appointment for today with his Cardiologist. I also offered appointment with his new PCP, but he declined that as well.  He Jerrit f/u soon.

## 2018-07-11 NOTE — Patient Instructions (Addendum)
Medication Instructions:   START TAKING:   1. TOPROL XL 25 MG ONCE A DAY  2. AMLODIPINE 10 MG ONCE A DAY    If you need a refill on your cardiac medications before your next appointment, please call your pharmacy.  Labwork: BMET (STAT) CBC TSH   LFTS AND LIPIDS    Testing/Procedures: NONE ORDERED  TODAY    Follow-Up:  WITH DR Meda Coffee IN October    Any Other Special Instructions Ananth Be Listed Below (If Applicable).  MAKE SURE YOU FOLLOW UP WITH YOUR PCP IN ONE WEEK

## 2018-07-11 NOTE — Telephone Encounter (Addendum)
[place] and requested to speak with an attending - spoke with Dr. Gwendlyn Deutscher to relay concerns below. She Oluwanifemi facilitate investigation on their end. SZP placed. I appreciate their help investigating this. Benny Henrie PA-C

## 2018-07-12 ENCOUNTER — Telehealth: Payer: Self-pay

## 2018-07-12 NOTE — Telephone Encounter (Signed)
Notes recorded by Frederik Schmidt, RN on 07/12/2018 at 8:36 AM EDT Spoke with patient who Craig Moore try to get in to his PCP by Friday for BP check and BMET. If he cannot get in, he Zuhair call us 8/7 to arrange.

## 2018-07-12 NOTE — Telephone Encounter (Signed)
-----   Message from Charlie Pitter, [place] sent at 07/11/2018  5:06 PM EDT ----- Juluis Rainier - triage - see phone note re: potassium level elevated in 11/2017 by primary care. Labs rechecked today to follow-up after he'd held lisinopril Sat/Sun.  Labs reviewed. CBC relatively stable, MCV elevated likely due to alcohol use. Lipid profile looks great. Labs show mildly decreased sodium count, continued elevated potassium and kidney function. Recommend he continue to hold lisinopril. Please decrease dietary intake of healthy sources of potassium including bananas, squash, yogurt, white beans, sweet potatoes, leafy greens, and avocados. He is eating a LOT of pintos which are high in potassium.  Can you see if primary care can get him in on Friday for recheck BP and BMET? If not, get BMET from our office and recheck BP with primary care ASAP. If they do not have ability to accomodate, get him back in to see [place] in 1 week for a recheck BP -can be pharmD or APP.  Dayna Dunn PA-C

## 2018-07-14 NOTE — Progress Notes (Signed)
Subjective:   Patient ID: Jerod Mcquain    DOB: 03-04-1941, 77 y.o. male   MRN: [id number]  Clif Hamme is a 77 y.o. male who is here for preoperative clearance for dental extraction secondary to chronic dental carries with plans for dentures.  High risk cardiac conditions: - Recent MI: No. Only 1 MI back in 2006 - Decompensated Heart Failure: No. - Unstable angina: No. - Symptomatic arrhythmia: No. Does have Moitz type 1 AV block. - Sx Valvular Disease: No.  Intermediate risk factors: Yes.  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   - [facility] Activity Status Index: 58.2 points  Surgery specific risk: Low   Further non-invasive evaluation: - EKG: No, recent EKG performed - Echo: No - Stress Testing: No  Need for medical therapy? Yes.   Patient is on Xarelto for chronic DVTs for approximately 2 years.  He reports no history of ecchymoses, hematuria, melena or hematochezia.  Complete ROS performed, see HPI for pertinent.  Objective:   BP (!) 144/72   Pulse 78   Temp 98.5 F (36.9 C) (Oral)   Ht 5\' 10"  (1.778 m)   Wt 156 lb 6.4 oz (70.9 kg)   SpO2 98%   BMI 22.44 kg/m  Vitals and nursing note reviewed.  General: well nourished, well developed, NAD with non-toxic appearance HEENT: normocephalic, atraumatic, moist mucous membranes, poor dentition Neck: supple, non-tender without lymphadenopathy Cardiovascular: regular rate and rhythm without murmurs, rubs, or gallops Lungs: clear to auscultation bilaterally with normal work of breathing Abdomen: soft, non-tender, non-distended, normoactive bowel sounds Skin: warm, dry, no rashes or lesions, cap refill < 2 seconds Extremities: warm and well perfused, normal tone, no edema  Assessment & Plan:   Tobacco use disorder Chronic.  Current everyday smoker. - Provided smoking cessation counseling along with instructions to gradually wean off over 2 weeks with use of nicotine replacement  therapy  Hyperkalemia Appears chronic.  Likely related to CKD.  Recently discontinued losartan. - Checking BMET  DVT (deep venous thrombosis) Chronic.  Currently on Xarelto and aspirin.  Primary reason for presurgical evaluation.  Risk and benefits were discussed with patient at length.  Patient would like to proceed with surgery and discontinuation of anticoagulants during this time. - Instructed to discontinue aspirin 5 days prior to surgery and Xarelto 2 days prior to surgery followed by restarting 1 day after surgery  Orders Placed This Encounter  Procedures  . Basic metabolic panel   Meds ordered this encounter  Medications  . nicotine (NICODERM CQ) 21 mg/24hr patch    Sig: Place 1 patch (21 mg total) onto the skin daily.    Dispense:  28 patch    Refill:  0  . nicotine polacrilex (RA NICOTINE GUM) 4 MG gum    Sig: Take 1 each (4 mg total) by mouth as needed for smoking cessation.    Dispense:  100 tablet    Refill:  0    I have independently evaluated patient. Patient is low risk for a low risk surgery. There are modifiable risk factors including smoking which we discussed at length importance of cessation.  Plan is to gradually wean over the next 2 weeks with quit date on 07/29/2018.  Patient given NicoDerm patch 21 mg / 24 hours along with nicotine gum for cravings.  RCRI/NSQIP calculation for MACE is: 58.2 points  Patient has undergone EKG 12-lead with Mobitz type I.  He does have known hyperkalemia likely  has recently discontinued his losartan.  We Kaidon recheck today along with kidney function and follow-up accordingly.  Majority of visit was discussion of risk vs benefit of holding DAPT.  Patient opted to proceed with holding medications.  Plan to stop aspirin 5 days prior to surgery along with Xarelto 2 days prior to surgery with instructions to restart with instructions to restart 1 day after surgery.  [name], [place], PGY-3 07/15/2018, 6:05  PM

## 2018-07-15 ENCOUNTER — Telehealth: Payer: Self-pay

## 2018-07-15 ENCOUNTER — Ambulatory Visit (INDEPENDENT_AMBULATORY_CARE_PROVIDER_SITE_OTHER): Payer: Medicare Other | Admitting: Family Medicine

## 2018-07-15 ENCOUNTER — Other Ambulatory Visit: Payer: Self-pay

## 2018-07-15 ENCOUNTER — Encounter: Payer: Self-pay | Admitting: Family Medicine

## 2018-07-15 VITALS — BP 144/72 | HR 78 | Temp 98.5°F | Ht 70.0 in | Wt 156.4 lb

## 2018-07-15 DIAGNOSIS — E875 Hyperkalemia: Secondary | ICD-10-CM

## 2018-07-15 DIAGNOSIS — I82403 Acute embolism and thrombosis of unspecified deep veins of lower extremity, bilateral: Secondary | ICD-10-CM | POA: Diagnosis not present

## 2018-07-15 DIAGNOSIS — Z01818 Encounter for other preprocedural examination: Secondary | ICD-10-CM | POA: Diagnosis not present

## 2018-07-15 DIAGNOSIS — F17200 Nicotine dependence, unspecified, uncomplicated: Secondary | ICD-10-CM | POA: Diagnosis not present

## 2018-07-15 MED ORDER — NICOTINE POLACRILEX 4 MG MT GUM: 4.0000 mg | CHEWING_GUM | OROMUCOSAL | 0 refills | Status: DC | PRN

## 2018-07-15 MED ORDER — NICOTINE 21 MG/24HR TD PT24
21.0000 mg | MEDICATED_PATCH | Freq: Every day | TRANSDERMAL | 0 refills | Status: DC
Start: 2018-07-15 — End: 2019-10-26

## 2018-07-15 NOTE — Assessment & Plan Note (Signed)
Chronic.  Current everyday smoker. - Provided smoking cessation counseling along with instructions to gradually wean off over 2 weeks with use of nicotine replacement therapy

## 2018-07-15 NOTE — Assessment & Plan Note (Signed)
Appears chronic.  Likely related to CKD.  Recently discontinued losartan. - Checking BMET

## 2018-07-15 NOTE — Assessment & Plan Note (Signed)
Chronic.  Currently on Xarelto and aspirin.  Primary reason for presurgical evaluation.  Risk and benefits were discussed with patient at length.  Patient would like to proceed with surgery and discontinuation of anticoagulants during this time. - Instructed to discontinue aspirin 5 days prior to surgery and Xarelto 2 days prior to surgery followed by restarting 1 day after surgery

## 2018-07-15 NOTE — Telephone Encounter (Signed)
-----   Message from Charlie Pitter, [place] sent at 07/11/2018  5:06 PM EDT ----- Craig Moore - triage - see phone note re: potassium level elevated in 11/2017 by primary care. Labs rechecked today to follow-up after he'd held lisinopril Sat/Sun.  Labs reviewed. CBC relatively stable, MCV elevated likely due to alcohol use. Lipid profile looks great. Labs show mildly decreased sodium count, continued elevated potassium and kidney function. Recommend he continue to hold lisinopril. Please decrease dietary intake of healthy sources of potassium including bananas, squash, yogurt, white beans, sweet potatoes, leafy greens, and avocados. He is eating a LOT of pintos which are high in potassium.  Can you see if primary care can get him in on Friday for recheck BP and BMET? If not, get BMET from our office and recheck BP with primary care ASAP. If they do not have ability to accomodate, get him back in to see [place] in 1 week for a recheck BP -can be pharmD or APP.  Dayna Dunn PA-C

## 2018-07-15 NOTE — Telephone Encounter (Signed)
Notes recorded by Frederik Schmidt, RN on 07/15/2018 at 9:19 AM EDT Spoke with the patient who informed me that he is going to his PCP today (8/9) for labs to be drawn along with a BP check. He Craig Moore tell them to fax the results to his cardiologist. He verbalized understanding. ------

## 2018-07-15 NOTE — Patient Instructions (Addendum)
Thank you for coming in to see [place] today. Please see below to review our plan for today's visit.  1.  I would like you to stop your Xarelto 2 days prior to surgery and your aspirin 5 days prior to surgery.  It is important that you stop smoking approximately 2 weeks prior to surgery.  I Taejon send in some nicotine replacement therapy to assist you with this.  Please do nicotine patch on your upper arm daily.  Can use the nicotine gum when you have her urges.  Slowly wean off of your cigarettes between now and your procedure. 2.  We [place] check your potassium level and kidney function today.  I Ronnald call you based on the results.  I would like to see you again in approximately 1 month to talk about your kidneys.  Please call the clinic at [phone] if your symptoms worsen or you have any concerns. It was our pleasure to serve you.  [name], [place], PGY-3

## 2018-07-16 LAB — BASIC METABOLIC PANEL
BUN / CREAT RATIO: 8 — AB (ref 10–24)
BUN: 14 mg/dL (ref 8–27)
CHLORIDE: 99 mmol/L (ref 96–106)
CO2: 21 mmol/L (ref 20–29)
Calcium: 8.8 mg/dL (ref 8.6–10.2)
Creatinine, Ser: 1.75 mg/dL — ABNORMAL HIGH (ref 0.76–1.27)
GFR calc Af Amer: 43 mL/min/{1.73_m2} — ABNORMAL LOW (ref >59)
GFR calc non Af Amer: 37 mL/min/{1.73_m2} — ABNORMAL LOW (ref >59)
Glucose: 95 mg/dL (ref 65–99)
POTASSIUM: 5.1 mmol/L (ref 3.5–5.2)
SODIUM: 134 mmol/L (ref 134–144)

## 2018-07-18 ENCOUNTER — Encounter: Payer: Self-pay | Admitting: Family Medicine

## 2018-07-19 DIAGNOSIS — H35362 Drusen (degenerative) of macula, left eye: Secondary | ICD-10-CM | POA: Diagnosis not present

## 2018-07-19 DIAGNOSIS — Z961 Presence of intraocular lens: Secondary | ICD-10-CM | POA: Diagnosis not present

## 2018-07-21 ENCOUNTER — Other Ambulatory Visit: Payer: Self-pay | Admitting: Cardiology

## 2018-07-21 ENCOUNTER — Telehealth: Payer: Self-pay | Admitting: *Deleted

## 2018-07-21 DIAGNOSIS — I25810 Atherosclerosis of coronary artery bypass graft(s) without angina pectoris: Secondary | ICD-10-CM

## 2018-07-21 DIAGNOSIS — I10 Essential (primary) hypertension: Secondary | ICD-10-CM

## 2018-07-21 NOTE — Telephone Encounter (Signed)
-----   Message from [place] Bing, DO sent at 07/19/2018  6:14 PM EDT ----- Please forward preoperative evaluation on 07/15/2018 to [facility].  Do not have their fax number.  You can call them at [phone]. Thank you.

## 2018-07-21 NOTE — Telephone Encounter (Signed)
-----   Message from [place] Bing, DO sent at 07/19/2018  6:14 PM EDT ----- Please forward preoperative evaluation on 07/15/2018 to [facility].  Do not have their fax number.  You can call them at [phone]. Thank you.

## 2018-07-21 NOTE — Telephone Encounter (Signed)
Faxed to dr Benna Dunks office. Deseree Kennon Holter, CMA

## 2018-07-29 ENCOUNTER — Telehealth: Payer: Self-pay

## 2018-07-29 NOTE — Telephone Encounter (Signed)
Pt pharmacy requested refills for LISINOPRIL 20 MG TAB. Med is no longer listed under their active med list. Tried calling pt but they did not answer. Rx needs to go to PCP.

## 2018-08-17 ENCOUNTER — Other Ambulatory Visit: Payer: Self-pay

## 2018-08-17 ENCOUNTER — Observation Stay (HOSPITAL_COMMUNITY)
Admission: EM | Admit: 2018-08-17 | Discharge: 2018-08-18 | Disposition: A | Discharge status: Home / Self Care | Payer: Medicare Other | Attending: Emergency Medicine | Admitting: Emergency Medicine

## 2018-08-17 ENCOUNTER — Inpatient Hospital Stay (HOSPITAL_COMMUNITY): Payer: Medicare Other

## 2018-08-17 ENCOUNTER — Encounter (HOSPITAL_COMMUNITY): Payer: Self-pay | Admitting: Emergency Medicine

## 2018-08-17 ENCOUNTER — Emergency Department (HOSPITAL_COMMUNITY): Payer: Medicare Other

## 2018-08-17 ENCOUNTER — Other Ambulatory Visit: Payer: Self-pay | Admitting: Cardiology

## 2018-08-17 DIAGNOSIS — E785 Hyperlipidemia, unspecified: Secondary | ICD-10-CM | POA: Diagnosis not present

## 2018-08-17 DIAGNOSIS — I2510 Atherosclerotic heart disease of native coronary artery without angina pectoris: Secondary | ICD-10-CM | POA: Diagnosis not present

## 2018-08-17 DIAGNOSIS — Z955 Presence of coronary angioplasty implant and graft: Secondary | ICD-10-CM | POA: Insufficient documentation

## 2018-08-17 DIAGNOSIS — Z86718 Personal history of other venous thrombosis and embolism: Secondary | ICD-10-CM | POA: Insufficient documentation

## 2018-08-17 DIAGNOSIS — R0609 Other forms of dyspnea: Secondary | ICD-10-CM

## 2018-08-17 DIAGNOSIS — I452 Bifascicular block: Secondary | ICD-10-CM | POA: Insufficient documentation

## 2018-08-17 DIAGNOSIS — I25110 Atherosclerotic heart disease of native coronary artery with unstable angina pectoris: Secondary | ICD-10-CM | POA: Diagnosis not present

## 2018-08-17 DIAGNOSIS — I440 Atrioventricular block, first degree: Secondary | ICD-10-CM | POA: Diagnosis not present

## 2018-08-17 DIAGNOSIS — E875 Hyperkalemia: Secondary | ICD-10-CM | POA: Diagnosis not present

## 2018-08-17 DIAGNOSIS — F17210 Nicotine dependence, cigarettes, uncomplicated: Secondary | ICD-10-CM | POA: Insufficient documentation

## 2018-08-17 DIAGNOSIS — E1122 Type 2 diabetes mellitus with diabetic chronic kidney disease: Secondary | ICD-10-CM | POA: Diagnosis not present

## 2018-08-17 DIAGNOSIS — J449 Chronic obstructive pulmonary disease, unspecified: Secondary | ICD-10-CM | POA: Diagnosis not present

## 2018-08-17 DIAGNOSIS — Z8719 Personal history of other diseases of the digestive system: Secondary | ICD-10-CM | POA: Diagnosis not present

## 2018-08-17 DIAGNOSIS — I130 Hypertensive heart and chronic kidney disease with heart failure and stage 1 through stage 4 chronic kidney disease, or unspecified chronic kidney disease: Secondary | ICD-10-CM | POA: Insufficient documentation

## 2018-08-17 DIAGNOSIS — I509 Heart failure, unspecified: Secondary | ICD-10-CM

## 2018-08-17 DIAGNOSIS — Z8249 Family history of ischemic heart disease and other diseases of the circulatory system: Secondary | ICD-10-CM | POA: Diagnosis not present

## 2018-08-17 DIAGNOSIS — I119 Hypertensive heart disease without heart failure: Secondary | ICD-10-CM

## 2018-08-17 DIAGNOSIS — Z7901 Long term (current) use of anticoagulants: Secondary | ICD-10-CM | POA: Insufficient documentation

## 2018-08-17 DIAGNOSIS — Z79899 Other long term (current) drug therapy: Secondary | ICD-10-CM | POA: Diagnosis not present

## 2018-08-17 DIAGNOSIS — M160 Bilateral primary osteoarthritis of hip: Secondary | ICD-10-CM | POA: Insufficient documentation

## 2018-08-17 DIAGNOSIS — Z7982 Long term (current) use of aspirin: Secondary | ICD-10-CM | POA: Diagnosis not present

## 2018-08-17 DIAGNOSIS — I252 Old myocardial infarction: Secondary | ICD-10-CM | POA: Insufficient documentation

## 2018-08-17 DIAGNOSIS — J441 Chronic obstructive pulmonary disease with (acute) exacerbation: Secondary | ICD-10-CM | POA: Diagnosis not present

## 2018-08-17 DIAGNOSIS — Z823 Family history of stroke: Secondary | ICD-10-CM | POA: Insufficient documentation

## 2018-08-17 DIAGNOSIS — N529 Male erectile dysfunction, unspecified: Secondary | ICD-10-CM | POA: Insufficient documentation

## 2018-08-17 DIAGNOSIS — N183 Chronic kidney disease, stage 3 (moderate): Secondary | ICD-10-CM | POA: Insufficient documentation

## 2018-08-17 DIAGNOSIS — Z833 Family history of diabetes mellitus: Secondary | ICD-10-CM | POA: Insufficient documentation

## 2018-08-17 DIAGNOSIS — R0602 Shortness of breath: Secondary | ICD-10-CM | POA: Diagnosis not present

## 2018-08-17 DIAGNOSIS — R05 Cough: Secondary | ICD-10-CM | POA: Diagnosis not present

## 2018-08-17 DIAGNOSIS — R0600 Dyspnea, unspecified: Secondary | ICD-10-CM

## 2018-08-17 DIAGNOSIS — I10 Essential (primary) hypertension: Secondary | ICD-10-CM

## 2018-08-17 HISTORY — DX: Dyspnea, unspecified: R06.00

## 2018-08-17 LAB — CBC WITH DIFFERENTIAL/PLATELET
Abs Immature Granulocytes: 0 10*3/uL (ref 0.0–0.1)
BASOS PCT: 1 %
Basophils Absolute: 0 10*3/uL (ref 0.0–0.1)
EOS ABS: 0.3 10*3/uL (ref 0.0–0.7)
Eosinophils Relative: 5 %
HCT: 36.5 % — ABNORMAL LOW (ref 39.0–52.0)
Hemoglobin: 12 g/dL — ABNORMAL LOW (ref 13.0–17.0)
IMMATURE GRANULOCYTES: 0 %
Lymphocytes Relative: 14 %
Lymphs Abs: 0.8 10*3/uL (ref 0.7–4.0)
MCH: 35.8 pg — ABNORMAL HIGH (ref 26.0–34.0)
MCHC: 32.9 g/dL (ref 30.0–36.0)
MCV: 109 fL — ABNORMAL HIGH (ref 78.0–100.0)
Monocytes Absolute: 0.6 10*3/uL (ref 0.1–1.0)
Monocytes Relative: 10 %
NEUTROS PCT: 70 %
Neutro Abs: 4 10*3/uL (ref 1.7–7.7)
PLATELETS: 193 10*3/uL (ref 150–400)
RBC: 3.35 MIL/uL — AB (ref 4.22–5.81)
RDW: 12.2 % (ref 11.5–15.5)
WBC: 5.8 10*3/uL (ref 4.0–10.5)

## 2018-08-17 LAB — COMPREHENSIVE METABOLIC PANEL
ALK PHOS: 111 U/L (ref 38–126)
ALT: 16 U/L (ref 0–44)
AST: 26 U/L (ref 15–41)
Albumin: 3.6 g/dL (ref 3.5–5.0)
Anion gap: 15 (ref 5–15)
BUN: 7 mg/dL — ABNORMAL LOW (ref 8–23)
CALCIUM: 8.5 mg/dL — AB (ref 8.9–10.3)
CHLORIDE: 100 mmol/L (ref 98–111)
CO2: 20 mmol/L — ABNORMAL LOW (ref 22–32)
Creatinine, Ser: 1.76 mg/dL — ABNORMAL HIGH (ref 0.61–1.24)
GFR, EST AFRICAN AMERICAN: 42 mL/min — AB (ref >60)
GFR, EST NON AFRICAN AMERICAN: 36 mL/min — AB (ref >60)
Glucose, Bld: 108 mg/dL — ABNORMAL HIGH (ref 70–99)
Potassium: 4.1 mmol/L (ref 3.5–5.1)
Sodium: 135 mmol/L (ref 135–145)
Total Bilirubin: 0.8 mg/dL (ref 0.3–1.2)
Total Protein: 6.8 g/dL (ref 6.5–8.1)

## 2018-08-17 LAB — TSH: TSH: 1.324 u[IU]/mL (ref 0.350–4.500)

## 2018-08-17 LAB — TROPONIN I: Troponin I: <0.03 ng/mL (ref <0.03)

## 2018-08-17 LAB — I-STAT TROPONIN, ED: Troponin i, poc: 0.03 ng/mL (ref 0.00–0.08)

## 2018-08-17 LAB — ECHOCARDIOGRAM COMPLETE
HEIGHTINCHES: 70 in
WEIGHTICAEL: 2480 [oz_av]

## 2018-08-17 LAB — BRAIN NATRIURETIC PEPTIDE: B Natriuretic Peptide: 217.4 pg/mL — ABNORMAL HIGH (ref 0.0–100.0)

## 2018-08-17 MED ORDER — IPRATROPIUM-ALBUTEROL 0.5-2.5 (3) MG/3ML IN SOLN
3.0000 mL | RESPIRATORY_TRACT | Status: DC
  Administered 2018-08-17 (×2): 3 mL via RESPIRATORY_TRACT
  Filled 2018-08-17 (×3): qty 3

## 2018-08-17 MED ORDER — ACETAMINOPHEN 650 MG RE SUPP: 650.0000 mg | Freq: Four times a day (QID) | RECTAL | Status: DC | PRN

## 2018-08-17 MED ORDER — PROMETHAZINE HCL 25 MG PO TABS: 12.5000 mg | ORAL_TABLET | Freq: Four times a day (QID) | ORAL | Status: DC | PRN

## 2018-08-17 MED ORDER — PREDNISONE 50 MG PO TABS
50.0000 mg | ORAL_TABLET | Freq: Every day | ORAL | Status: DC
  Administered 2018-08-17 – 2018-08-18 (×2): 50 mg via ORAL
  Filled 2018-08-17 (×2): qty 1

## 2018-08-17 MED ORDER — POLYETHYLENE GLYCOL 3350 17 G PO PACK: 17.0000 g | PACK | Freq: Every day | ORAL | Status: DC | PRN

## 2018-08-17 MED ORDER — NICOTINE 14 MG/24HR TD PT24
14.0000 mg | MEDICATED_PATCH | Freq: Every day | TRANSDERMAL | Status: DC
  Administered 2018-08-17 – 2018-08-18 (×2): 14 mg via TRANSDERMAL
  Filled 2018-08-17 (×2): qty 1

## 2018-08-17 MED ORDER — RIVAROXABAN 20 MG PO TABS
20.0000 mg | ORAL_TABLET | Freq: Every day | ORAL | Status: DC
  Administered 2018-08-17: 20 mg via ORAL
  Filled 2018-08-17: qty 1

## 2018-08-17 MED ORDER — ASPIRIN EC 81 MG PO TBEC
81.0000 mg | DELAYED_RELEASE_TABLET | Freq: Every day | ORAL | Status: DC
  Administered 2018-08-17 – 2018-08-18 (×2): 81 mg via ORAL
  Filled 2018-08-17 (×2): qty 1

## 2018-08-17 MED ORDER — METOPROLOL SUCCINATE ER 25 MG PO TB24
25.0000 mg | ORAL_TABLET | Freq: Every day | ORAL | Status: DC
  Administered 2018-08-17 – 2018-08-18 (×2): 25 mg via ORAL
  Filled 2018-08-17 (×2): qty 1

## 2018-08-17 MED ORDER — IPRATROPIUM-ALBUTEROL 0.5-2.5 (3) MG/3ML IN SOLN
3.0000 mL | Freq: Three times a day (TID) | RESPIRATORY_TRACT | Status: DC
  Administered 2018-08-18: 3 mL via RESPIRATORY_TRACT
  Filled 2018-08-17: qty 3

## 2018-08-17 MED ORDER — AMLODIPINE BESYLATE 10 MG PO TABS
10.0000 mg | ORAL_TABLET | Freq: Every day | ORAL | Status: DC
  Administered 2018-08-17 – 2018-08-18 (×2): 10 mg via ORAL
  Filled 2018-08-17 (×2): qty 1

## 2018-08-17 MED ORDER — AZITHROMYCIN 250 MG PO TABS
250.0000 mg | ORAL_TABLET | Freq: Every day | ORAL | Status: DC
  Administered 2018-08-18: 250 mg via ORAL
  Filled 2018-08-17: qty 1

## 2018-08-17 MED ORDER — FUROSEMIDE 10 MG/ML IJ SOLN
40.0000 mg | Freq: Once | INTRAMUSCULAR | Status: AC
  Administered 2018-08-17: 40 mg via INTRAVENOUS
  Filled 2018-08-17: qty 4

## 2018-08-17 MED ORDER — AZITHROMYCIN 500 MG PO TABS
500.0000 mg | ORAL_TABLET | Freq: Once | ORAL | Status: AC
  Administered 2018-08-17: 500 mg via ORAL
  Filled 2018-08-17: qty 1

## 2018-08-17 MED ORDER — ACETAMINOPHEN 325 MG PO TABS: 650.0000 mg | ORAL_TABLET | Freq: Four times a day (QID) | ORAL | Status: DC | PRN

## 2018-08-17 MED ORDER — ATORVASTATIN CALCIUM 20 MG PO TABS
20.0000 mg | ORAL_TABLET | Freq: Every day | ORAL | Status: DC
  Administered 2018-08-17: 20 mg via ORAL
  Filled 2018-08-17: qty 1

## 2018-08-17 MED ORDER — FOLIC ACID 1 MG PO TABS
1.0000 mg | ORAL_TABLET | Freq: Every day | ORAL | Status: DC
  Administered 2018-08-17 – 2018-08-18 (×2): 1 mg via ORAL
  Filled 2018-08-17 (×2): qty 1

## 2018-08-17 NOTE — Progress Notes (Signed)
  Echocardiogram 2D Echocardiogram has been performed.  Craig Moore 08/17/2018, 3:12 PM

## 2018-08-17 NOTE — Progress Notes (Signed)
MD on call notified about patient converting into a-fib. No further orders.

## 2018-08-17 NOTE — ED Provider Notes (Addendum)
[place] EMERGENCY DEPARTMENT Provider Note   CSN: [id number] Arrival date & time: 08/17/18  5809     History   Chief Complaint Chief Complaint  Patient presents with  . Shortness of Breath    HPI Craig Moore is a 77 y.o. male.  The history is provided by the patient.  Shortness of Breath  This is a new problem. The problem occurs continuously.The current episode started more than 2 days ago. The problem has been gradually worsening. Associated symptoms include cough and orthopnea. Pertinent negatives include no fever, no headaches, no coryza, no rhinorrhea, no sore throat, no swollen glands, no ear pain, no sputum production, no hemoptysis, no wheezing, no chest pain, no syncope, no vomiting, no abdominal pain, no rash, no leg pain and no leg swelling. It is unknown what precipitated the problem. Risk factors include smoking. He has tried nothing for the symptoms. The treatment provided no relief. He has had prior ED visits. Associated medical issues include CAD (CHF) and DVT (on xarelto).    Past Medical History:  Diagnosis Date  . CAD (coronary artery disease)    Non-STEMI May, 2006, bare-metal stent  //   nuclear, January, 2012, EF 70%, no ischemia  . CKD (chronic kidney disease), stage III ([place])   . DVT (deep venous thrombosis) ([place])    December, 2011, Coumadin therapy  . Ejection fraction    EF 55%, echo, October, 2006, technically difficult  //  EF 70%, nuclear, January, 2012  . Groin pain    June, 2013  . Hyperlipidemia   . Hypertension   . Myocardial infarction ([place])   . Pre-syncope    ER, January, 2011, dehydration  . Tobacco abuse     Patient Active Problem List   Diagnosis Date Noted  . Hyperkalemia 07/15/2018  . 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 Anemia 04/20/2016  . 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 Elevated serum creatinine 04/10/2016  . Erectile dysfunction 08/19/2015  . Hyponatremia 08/20/2014  . Umbilical hernia 98/33/8250  .  Osteoarthritis of both hips 06/01/2012  . CAD (coronary artery disease)   . DVT (deep venous thrombosis) ([place])   . Dyslipidemia 02/23/2010  . Tobacco use disorder 02/23/2010  . Hypertension, well controlled 02/21/2010    Past Surgical History:  Procedure Laterality Date  . CORONARY ANGIOPLASTY  2006        Home Medications    Prior to Admission medications   Medication Sig Start Date End Date Taking? Authorizing Provider  amLODipine (NORVASC) 10 MG tablet Take 1 tablet (10 mg total) by mouth daily. 07/11/18 10/09/18  Charlie Pitter, PA-C  aspirin EC 81 MG tablet Take 81 mg by mouth daily. 03/23/14   Mariel Aloe, MD  atorvastatin (LIPITOR) 20 MG tablet TAKE 1 TABLET EVERY DAY AT 6 PM. Please keep upcoming appt in October for future refills. Thank you 06/24/18   Dorothy Spark, MD  b complex vitamins capsule Take 1 capsule by mouth daily. 10/04/15   Brunetta Genera, MD  furosemide (LASIX) 20 MG tablet TAKE 1 TAB DAILY AS NEEDED FOR DOE, SOB, EDEMA, OR WEIGHT GAIN >3 POUNDS OVERNIGHT OR 5 POUNDS/WK 10/27/17   Dorothy Spark, MD  metoprolol succinate (TOPROL-XL) 25 MG 24 hr tablet Take 1 tablet (25 mg total) by mouth daily. 07/11/18   Dunn, Nedra Hai, PA-C  nicotine (NICODERM CQ) 21 mg/24hr patch Place 1 patch (21 mg total) onto the skin daily. 07/15/18   [place] Bing, DO  nicotine polacrilex (RA NICOTINE GUM) 4 MG gum Take 1 each (4 mg total) by mouth as needed for smoking cessation. 07/15/18   [place] Bing, DO  sildenafil (VIAGRA) 100 MG tablet Take 0.5 tablets (50 mg total) by mouth daily as needed for erectile dysfunction. 08/19/15   Mariel Aloe, MD  XARELTO 20 MG TABS tablet TAKE 1 TABLET (20 MG TOTAL) BY MOUTH DAILY WITH SUPPER. 02/15/18   [place] Bing, DO    Family History Family History  Problem Relation Age of Onset  . Cancer Mother   . Heart disease Father   . Diabetes Father   . Stroke Father   . Breast cancer Sister     Social History Social  History   Tobacco Use  . Smoking status: Current Every Day Smoker    Packs/day: 0.25    Types: Cigarettes  . Smokeless tobacco: Never Used  Substance Use Topics  . Alcohol use: Yes    Alcohol/week: 8.0 standard drinks    Types: 8 Glasses of wine per week  . Drug use: No     Allergies   Patient has no known allergies.   Review of Systems Review of Systems  Constitutional: Negative for chills and fever.  HENT: Negative for ear pain, rhinorrhea and sore throat.   Eyes: Negative for pain and visual disturbance.  Respiratory: Positive for cough and shortness of breath. Negative for hemoptysis, sputum production and wheezing.   Cardiovascular: Positive for orthopnea. Negative for chest pain, palpitations, leg swelling and syncope.  Gastrointestinal: Negative for abdominal pain and vomiting.  Genitourinary: Negative for dysuria and hematuria.  Musculoskeletal: Negative for arthralgias and back pain.  Skin: Negative for color change and rash.  Neurological: Negative for seizures, syncope and headaches.  All other systems reviewed and are negative.    Physical Exam Updated Vital Signs  ED Triage Vitals  Enc Vitals Group     BP 08/17/18 0716 (!) 170/80     Pulse Rate 08/17/18 0716 95     Resp 08/17/18 0716 17     Temp 08/17/18 0716 98.2 F (36.8 C)     Temp Source 08/17/18 0716 Oral     SpO2 08/17/18 0716 95 %     Weight --      Height --      Head Circumference --      Peak Flow --      Pain Score 08/17/18 0717 0     Pain Loc --      Pain Edu? --      Excl. in [place]? --     Physical Exam  Constitutional: He is oriented to person, place, and time. He appears well-developed and well-nourished.  HENT:  Head: Normocephalic and atraumatic.  Mouth/Throat: Oropharynx is clear and moist.  Eyes: Pupils are equal, round, and reactive to light. Conjunctivae and EOM are normal.  Neck: Normal range of motion. Neck supple.  Cardiovascular: Normal rate and regular rhythm.  No  murmur heard. Pulmonary/Chest: Effort normal. No respiratory distress. He has decreased breath sounds. He has no wheezes. He has rales.  Abdominal: Soft. He exhibits distension. There is no tenderness.  Musculoskeletal: He exhibits no edema.       Right lower leg: He exhibits no edema.       Left lower leg: He exhibits no edema.  Neurological: He is alert and oriented to person, place, and time.  Skin: Skin is warm and dry.  Psychiatric: He has a normal mood  and affect.  Nursing note and vitals reviewed.    ED Treatments / Results  Labs (all labs ordered are listed, but only abnormal results are displayed) Labs Reviewed  COMPREHENSIVE METABOLIC PANEL - Abnormal; Notable for the following components:      Result Value   CO2 20 (*)    Glucose, Bld 108 (*)    BUN 7 (*)    Creatinine, Ser 1.76 (*)    Calcium 8.5 (*)    GFR calc non Af Amer 36 (*)    GFR calc Af Amer 42 (*)    All other components within normal limits  CBC WITH DIFFERENTIAL/PLATELET - Abnormal; Notable for the following components:   RBC 3.35 (*)    Hemoglobin 12.0 (*)    HCT 36.5 (*)    MCV 109.0 (*)    MCH 35.8 (*)    All other components within normal limits  BRAIN NATRIURETIC PEPTIDE - Abnormal; Notable for the following components:   B Natriuretic Peptide 217.4 (*)    All other components within normal limits  I-STAT TROPONIN, ED    EKG EKG Interpretation  Date/Time:  Wednesday August 17 2018 07:17:15 EDT Ventricular Rate:  82 PR Interval:    QRS Duration: 102 QT Interval:  360 QTC Calculation: 420 R Axis:   -49 Text Interpretation:  Sinus rhythm with marked sinus arrhythmia with 1st degree A-V block Left axis deviation Incomplete right bundle branch block Confirmed by Lennice Sites [phone]) on 08/17/2018 7:29:58 AM Also confirmed by Lennice Sites [phone]), editor Philomena Doheny [phone])  on 08/17/2018 8:03:16 AM   Radiology Dg Chest 2 View  Result Date: 08/17/2018 CLINICAL DATA:  Cough and  shortness of breath. EXAM: CHEST - 2 VIEW COMPARISON:  08/14/2014 FINDINGS: The lungs are clear without focal pneumonia, edema, pneumothorax or pleural effusion. Nodular density seen posterior lung base on lateral film unchanged since 08/14/2014. The visualized bony structures of the thorax are intact. Telemetry leads overlie the chest. IMPRESSION: No active cardiopulmonary disease. Electronically Signed   By: Misty Stanley M.D.   On: 08/17/2018 08:23    Procedures .Critical Care Performed by: Lennice Sites, DO Authorized by: Lennice Sites, DO   Critical care provider statement:    Critical care time (minutes):  30   Critical care time was exclusive of:  Separately billable procedures and treating other patients and teaching time   Critical care was necessary to treat or prevent imminent or life-threatening deterioration of the following conditions:  Respiratory failure and circulatory failure   Critical care was time spent personally by me on the following activities:  Development of treatment plan with patient or surrogate, discussions with primary provider, evaluation of patient's response to treatment, examination of patient, ordering and review of laboratory studies, ordering and performing treatments and interventions, ordering and review of radiographic studies, pulse oximetry, re-evaluation of patient's condition and review of old charts   I assumed direction of critical care for this patient from another provider in my specialty: no     (including critical care time)  Medications Ordered in ED Medications  furosemide (LASIX) injection 40 mg (40 mg Intravenous Given 08/17/18 0847)     Initial Impression / Assessment and Plan / ED Course  I have reviewed the triage vital signs and the nursing notes.  Pertinent labs & imaging results that were available during my care of the patient were reviewed by me and considered in my medical decision making (see chart for details).  Kayven  Guerrero is a 77 year old male with history of hypertension, high cholesterol, CAD, DVT on Xarelto who presents the ED with shortness of breath.  Patient with normal vitals.  No fever.  Patient states that over the last 24 to 48 hours he has noticed some increased shortness of breath.  He states that it is worse with exertion and has a hard time catching his breath when he walks.  Patient ambulated to the bathroom and his oxygen dropped to the high 80s.  He was placed on 2 L.  Patient with good work of breathing.  Has rales throughout on exam.  States that he feels like he has gained some fluid in his belly which is typically how he gains his fluid with his heart failure.  Does not have any peripheral edema.  Patient denies any chest pain, fever.  He has had a bad cough.  But no sputum production.  EKG shows sinus rhythm with no signs of ischemic changes.  Unchanged from prior.  Patient with unremarkable troponin but elevated BNP.  Chest x-ray overall unremarkable with no significant pleural effusion, pneumonia, pneumothorax.  Creatinine at baseline.  No significant leukocytosis or electrolyte abnormality.  History and physical is most consistent with volume overload and given IV Lasix.  Patient is on Xarelto and low concern for PE at this time.  Does have elevated creatinine and Zen defer further PE work-up for inpatient team.  Suspect that this is likely due to worsening heart failure.  Patient has not had an echocardiogram in several years.  Patient to be admitted for further care.  Hemodynamically stable throughout my care.  Final Clinical Impressions(s) / ED Diagnoses   Final diagnoses:  Acute on chronic congestive heart failure, unspecified heart failure type [facility])    ED Discharge Orders    None       Lennice Sites, DO 08/17/18 [place], [place], DO 08/26/18 2311

## 2018-08-17 NOTE — H&P (Signed)
[facility] Admission History and Physical Service Pager: [phone]  Patient name: Craig Moore Medical record number: [id number] Date of birth: 02-13-1941 Age: 77 y.o. Gender: male  Primary Care Provider: Venice Bing, DO Consultants: None Code Status: Full  Chief Complaint: Cough and Shortness of Breath  Assessment and Plan: Craig Moore is a 77 y.o. male presenting with one day of cough and shortness of breath. 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 Acute. Stable. Considered COPD vs CHF exacerbation vs PNA vs NSTEMI. NSTEMI very unlikely given patient has had no chest pain, jaw pain, nausea, vomiting, and his EKG is unchanged from previous EKGs. However, given his significant history of CAD and smoking and presence of have an incomplete RBBB I Sayyid order serial troponins. Chest x-ray was unremarkable and he has a non-productive cough with no fever and no leukocytosis so infectious process such as PNA is unlikely. PE was considered given his history of DVT but patient has been compliant on Xarelto and ASA. He did stop them temporarily for a dental procedure but he restarted them one day after the procedure. No calf pain or tenderness or LE swelling. No chest pain, stable vitals with O2 sats at 95% on 2L of [place], and Wells score of 1 makes PE unlikely. His significant smoking history and his rhonchus lung sounds on exam have me suspicious he may have undiagnosed COPD and this could be a contributing factor to his SOB and cough. If his condition does not improve with lasix and fluid restriction, then consider prednisone 40mg  for 5 days and starting abx. Given his mild elevation in BNP, trace pedal edema, symptoms, and history of CHF I am also treating as a CHF exacerbation. - Admit to inpatient med-surg, attending Dr. Andria Frames - Cardiac  monitoring for 24 hours - Routine vitals - Daily weights, I/Os, fluid restriction, heart healthy diet - IV Lasix 40mg  daily - Dounebs scheduled q6 - Cont Metoprolol 25mg  daily - Troponin-I x 3 - TSH, Echocardiogram - Repeat EKG in am - Daily CBC, BMP  Tobacco Use Disorder. Chronic. Patient smokes about a pack per day and has been decreasing his use.  - Nicotine patch 14mg  daily  Hyperlipidemia. Chronic. Stable. Last lipid panel 07/11/2018 well controlled with LDL of 41, Tri 127, Total Chol of 129. - Cont Atorvastatin 20mg  daily  HTN. Chronic. Stable. Patient has uncontrolled BP on admission and admitted to not taking amlodipine at home. On Metoprolol 25mg  daily at home. - Restart Amlodipine 10mg  daily - cont Metoprolol 25mg  daily - cont IV Lasix - routine bp checks  CAD s/p two stents placed in 2016. Chronic. Stable. Patient continues to smoke and has HTN and HLD. He is not diabetic. - Cont ASA 81mg  daily - Nicotine patch, atorvastatin, and BP meds to reduce risk factors. - Follow up on Troponins, repeat EKG  CKD stage III. Chronic. Stable. Creatnine is close to his baseline range (1.53-1.75) on admission at 1.76. Most likely Taiki increase slightly with lasix and fluid restriction. - Monitor with daily BMP  Hx of DVT. Chronic. Stable. Negative for calf or leg pain, stable vitals, and no erythema of lower extremities on admission. - Cont to monitor for symptoms - Cont Xarelto 20mg  daily and ASA daily  Hx of Hyperkalemia. Chronic. Stable. Elevated as high at 6.0 on 11/08/2017. 4.1 on admission. -  Daily BMP  FEN/GI: heart healthy, renal fluid restriction Prophylaxis: On Xarelto for DVT prophylaxis  Disposition: Stable  History of Present Illness:  Craig Moore is a 77 y.o. male presenting with Patient states his symptoms started yesterday after cutting grass. He was able to complete cutting his yard and felt ok than started to cough, which kept him up all night, and he  then noticed it became harder for him to breath. Patient states it continued toget worse then developeed SOB on walking with lightheadedness, dizziness, and could barely make it to his bed this morning at which point he called the EMS. He has been complaint with most of his medications but is not taking amlodipine. He states he has had episodes like this in the past and was treated as CHF exacerbation. Denies any chest pain. Just a cough and shortness and breath. Oxygen does not make him feel any better.  Review Of Systems: Per HPI with the following additions:   Review of Systems  Constitutional: Negative for chills, fever and malaise/fatigue.  HENT: Negative for congestion and sore throat.   Respiratory: Positive for cough and shortness of breath. Negative for hemoptysis, sputum production and wheezing.   Cardiovascular: Positive for orthopnea. Negative for chest pain, palpitations, leg swelling and PND.  Gastrointestinal: Negative for abdominal pain, heartburn, nausea and vomiting.  Genitourinary: Negative for dysuria, frequency and urgency.  Neurological: Positive for dizziness. Negative for sensory change, speech change, loss of consciousness and weakness.    Patient Active Problem List   Diagnosis Date Noted  . CHF (congestive heart failure) ([place]) 08/17/2018  . Hyperkalemia 07/15/2018  . 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 Anemia 04/20/2016  . 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 Elevated serum creatinine 04/10/2016  . Erectile dysfunction 08/19/2015  . Hyponatremia 08/20/2014  . Umbilical hernia 75/64/3329  . Osteoarthritis of both hips 06/01/2012  . CAD (coronary artery disease)   . DVT (deep venous thrombosis) ([place])   . Dyslipidemia 02/23/2010  . Tobacco use disorder 02/23/2010  . Hypertension, well controlled 02/21/2010    Past Medical History: Past Medical History:  Diagnosis Date  . CAD (coronary artery disease)    Non-STEMI May, 2006, bare-metal stent  //   nuclear,  January, 2012, EF 70%, no ischemia  . CKD (chronic kidney disease), stage III ([place])   . DVT (deep venous thrombosis) ([place])    December, 2011, Coumadin therapy  . Ejection fraction    EF 55%, echo, October, 2006, technically difficult  //  EF 70%, nuclear, January, 2012  . Groin pain    June, 2013  . Hyperlipidemia   . Hypertension   . Myocardial infarction ([place])   . Pre-syncope    ER, January, 2011, dehydration  . Tobacco abuse     Past Surgical History: Past Surgical History:  Procedure Laterality Date  . CORONARY ANGIOPLASTY  2006    Social History: Social History   Tobacco Use  . Smoking status: Current Every Day Smoker    Packs/day: 0.25    Types: Cigarettes  . Smokeless tobacco: Never Used  Substance Use Topics  . Alcohol use: Yes    Alcohol/week: 8.0 standard drinks    Types: 8 Glasses of wine per week  . Drug use: No   Additional social history: None Please also refer to relevant sections of EMR.  Family History: Family History  Problem Relation Age of Onset  . Cancer Mother   . Heart disease Father   . Diabetes Father   .  Stroke Father   . Breast cancer Sister     Allergies and Medications: No Known Allergies No current facility-administered medications on file prior to encounter.    Current Outpatient Medications on File Prior to Encounter  Medication Sig Dispense Refill  . aspirin EC 81 MG tablet Take 81 mg by mouth daily.    Marland Kitchen atorvastatin (LIPITOR) 20 MG tablet TAKE 1 TABLET EVERY DAY AT 6 PM. Please keep upcoming appt in October for future refills. Thank you (Patient taking differently: Take 20 mg by mouth daily at 6 PM. TAKE 1 TABLET EVERY DAY AT 6 PM. Please keep upcoming appt in October for future refills. Thank you) 90 tablet 0  . b complex vitamins capsule Take 1 capsule by mouth daily.    . folic acid (FOLVITE) 1 MG tablet Take 1 mg by mouth daily.    . furosemide (LASIX) 20 MG tablet TAKE 1 TAB DAILY AS NEEDED FOR DOE, SOB, EDEMA, OR  WEIGHT GAIN >3 POUNDS OVERNIGHT OR 5 POUNDS/WK (Patient taking differently: Take 20 mg by mouth daily as needed. TAKE 1 TAB DAILY AS NEEDED FOR DOE, SOB, EDEMA, OR WEIGHT GAIN >3 POUNDS OVERNIGHT OR 5 POUNDS/WK) 90 tablet 2  . metoprolol succinate (TOPROL-XL) 25 MG 24 hr tablet Take 1 tablet (25 mg total) by mouth daily. 30 tablet 6  . sildenafil (VIAGRA) 100 MG tablet Take 0.5 tablets (50 mg total) by mouth daily as needed for erectile dysfunction. 10 tablet 11  . XARELTO 20 MG TABS tablet TAKE 1 TABLET (20 MG TOTAL) BY MOUTH DAILY WITH SUPPER. (Patient taking differently: Take 20 mg by mouth daily with supper. ) 90 tablet 3  . amLODipine (NORVASC) 10 MG tablet Take 1 tablet (10 mg total) by mouth daily. (Patient not taking: Reported on 08/17/2018) 30 tablet 6  . HYDROcodone-acetaminophen (NORCO/VICODIN) 5-325 MG tablet Take 1 tablet by mouth every 6 (six) hours as needed. for pain  0  . nicotine (NICODERM CQ) 21 mg/24hr patch Place 1 patch (21 mg total) onto the skin daily. (Patient not taking: Reported on 08/17/2018) 28 patch 0  . nicotine polacrilex (RA NICOTINE GUM) 4 MG gum Take 1 each (4 mg total) by mouth as needed for smoking cessation. (Patient not taking: Reported on 08/17/2018) 100 tablet 0    Objective: BP (!) 157/81   Pulse 91   Temp 98.2 F (36.8 C) (Oral)   Resp (!) 21   Ht 5\' 10"  (1.778 m)   Wt 70.3 kg   SpO2 96%   BMI 22.24 kg/m  Exam: Gen: Alert and Oriented x 3, NAD HEENT: Normocephalic, atraumatic, PERRLA, EOMI,  non-erythematous pharyngeal mucosa, no exudates Neck: trachea midline, no thyroidmegaly, no LAD CV: Tachycardic, reg rhythem, no murmurs, normal S1, S2 split Resp: Rhonchus lung sound bilaterally, no wheezing, rales,  comfortable work of breathing on 2L of O2 [place] Abd: mildly distended, non-tender, soft, +bs in all four quadrants MSK: Moves all four extremities Ext: no clubbing, cyanosis, or trace LE edema bilaterally Neuro: No gross deficits Skin: warm, dry,  intact, no rashes Psych: appropriate mood and affect  Labs and Imaging: CBC BMET  Recent Labs  Lab 08/17/18 0736  WBC 5.8  HGB 12.0*  HCT 36.5*  PLT 193   Recent Labs  Lab 08/17/18 0736  NA 135  K 4.1  CL 100  CO2 20*  BUN 7*  CREATININE 1.76*  GLUCOSE 108*  CALCIUM 8.5*     TSH: pending Troponin-I stat: 0.03 BNP:  217.4  9/11 CXR:  IMPRESSION: No active cardiopulmonary disease.  EKG: Left axis deviation with 1st degree AV block and LBBB with partial RBBB.  Echocardiogram: pending  Nuala Alpha, DO 08/17/2018, 9:15 AM PGY-2, [place] Intern pager: [phone], text pages welcome

## 2018-08-17 NOTE — ED Notes (Signed)
Patient transported to X-ray 

## 2018-08-17 NOTE — ED Triage Notes (Addendum)
Pt reports cold starting to bother him a day ago- now SOB with exertion. Hx of stents. No swelling noted. Denies chest pain. Takes lasix PRN.

## 2018-08-18 ENCOUNTER — Encounter (HOSPITAL_COMMUNITY): Payer: Self-pay | Admitting: Family Medicine

## 2018-08-18 ENCOUNTER — Ambulatory Visit: Payer: Medicare Other | Admitting: Family Medicine

## 2018-08-18 DIAGNOSIS — E1122 Type 2 diabetes mellitus with diabetic chronic kidney disease: Secondary | ICD-10-CM | POA: Diagnosis not present

## 2018-08-18 DIAGNOSIS — I130 Hypertensive heart and chronic kidney disease with heart failure and stage 1 through stage 4 chronic kidney disease, or unspecified chronic kidney disease: Secondary | ICD-10-CM | POA: Diagnosis not present

## 2018-08-18 DIAGNOSIS — I509 Heart failure, unspecified: Secondary | ICD-10-CM | POA: Diagnosis not present

## 2018-08-18 DIAGNOSIS — J449 Chronic obstructive pulmonary disease, unspecified: Secondary | ICD-10-CM | POA: Diagnosis not present

## 2018-08-18 DIAGNOSIS — J441 Chronic obstructive pulmonary disease with (acute) exacerbation: Secondary | ICD-10-CM | POA: Diagnosis not present

## 2018-08-18 DIAGNOSIS — N183 Chronic kidney disease, stage 3 (moderate): Secondary | ICD-10-CM | POA: Diagnosis not present

## 2018-08-18 LAB — TROPONIN I: Troponin I: <0.03 ng/mL (ref <0.03)

## 2018-08-18 LAB — BASIC METABOLIC PANEL
Anion gap: 11 (ref 5–15)
BUN: 12 mg/dL (ref 8–23)
CO2: 22 mmol/L (ref 22–32)
Calcium: 8.5 mg/dL — ABNORMAL LOW (ref 8.9–10.3)
Chloride: 103 mmol/L (ref 98–111)
Creatinine, Ser: 1.73 mg/dL — ABNORMAL HIGH (ref 0.61–1.24)
GFR calc Af Amer: 43 mL/min — ABNORMAL LOW (ref >60)
GFR, EST NON AFRICAN AMERICAN: 37 mL/min — AB (ref >60)
GLUCOSE: 154 mg/dL — AB (ref 70–99)
POTASSIUM: 4.4 mmol/L (ref 3.5–5.1)
Sodium: 136 mmol/L (ref 135–145)

## 2018-08-18 LAB — CBC
HCT: 36.5 % — ABNORMAL LOW (ref 39.0–52.0)
HEMOGLOBIN: 12.2 g/dL — AB (ref 13.0–17.0)
MCH: 35.8 pg — AB (ref 26.0–34.0)
MCHC: 33.4 g/dL (ref 30.0–36.0)
MCV: 107 fL — AB (ref 78.0–100.0)
PLATELETS: 192 10*3/uL (ref 150–400)
RBC: 3.41 MIL/uL — ABNORMAL LOW (ref 4.22–5.81)
RDW: 12 % (ref 11.5–15.5)
WBC: 4.1 10*3/uL (ref 4.0–10.5)

## 2018-08-18 LAB — FOLATE: Folate: 54 ng/mL (ref >5.9)

## 2018-08-18 LAB — VITAMIN B12: Vitamin B-12: 1072 pg/mL — ABNORMAL HIGH (ref 180–914)

## 2018-08-18 MED ORDER — PREDNISONE 50 MG PO TABS: 50.0000 mg | ORAL_TABLET | Freq: Every day | ORAL | 0 refills | Status: DC

## 2018-08-18 MED ORDER — ALBUTEROL SULFATE HFA 108 (90 BASE) MCG/ACT IN AERS: 2.0000 | INHALATION_SPRAY | Freq: Four times a day (QID) | RESPIRATORY_TRACT | 2 refills | Status: DC | PRN

## 2018-08-18 MED ORDER — AZITHROMYCIN 250 MG PO TABS: 250.0000 mg | ORAL_TABLET | Freq: Every day | ORAL | 0 refills | Status: AC

## 2018-08-18 MED ORDER — TIOTROPIUM BROMIDE MONOHYDRATE 18 MCG IN CAPS
18.0000 ug | ORAL_CAPSULE | Freq: Every day | RESPIRATORY_TRACT | Status: DC
  Administered 2018-08-18: 18 ug via RESPIRATORY_TRACT
  Filled 2018-08-18: qty 5

## 2018-08-18 MED ORDER — TIOTROPIUM BROMIDE MONOHYDRATE 18 MCG IN CAPS: 18.0000 ug | ORAL_CAPSULE | Freq: Every day | RESPIRATORY_TRACT | 0 refills | Status: DC

## 2018-08-18 MED ORDER — IPRATROPIUM-ALBUTEROL 0.5-2.5 (3) MG/3ML IN SOLN: 3.0000 mL | Freq: Two times a day (BID) | RESPIRATORY_TRACT | Status: DC

## 2018-08-18 MED ORDER — PREDNISONE 50 MG PO TABS: 50.0000 mg | ORAL_TABLET | Freq: Every day | ORAL | 0 refills | Status: AC

## 2018-08-18 NOTE — Progress Notes (Signed)
Patient is been discharge home as order, IV and tele d/C, discharge teachings and summaries review with patients, denies concerns at this time.

## 2018-08-18 NOTE — Progress Notes (Signed)
Staff ambulated patient as requested by MD, patient walked from room to end of hall and back to room, no walker no oxygen used,  Complain of mild exertion after walking. O2 sats 95% before and 92% after walking.  Craig Moore continue to monitor and update as needed.

## 2018-08-18 NOTE — Discharge Instructions (Signed)
Information on my medicine - XARELTO (rivaroxaban)  WHY WAS XARELTO PRESCRIBED FOR YOU? Xarelto was prescribed to treat blood clots that may have previously been found in the veins of your legs (deep vein thrombosis) and to reduce the risk of them occurring again.  What do you need to know about Xarelto?  Your current dose is one 20 mg tablet taken ONCE A DAY with your evening meal.  DO NOT stop taking Xarelto without talking to the health care provider who prescribed the medication.  Refill your prescription for 20 mg tablets before you run out.  After discharge, you should have regular check-up appointments with your healthcare provider that is prescribing your Xarelto.  In the future your dose may need to be changed if your kidney function changes by a significant amount.  What do you do if you miss a dose? If you are taking Xarelto ONCE DAILY and you miss a dose, take it as soon as you remember on the same day then continue your regularly scheduled once daily regimen the next day. Do not take two doses of Xarelto at the same time.   Important Safety Information Xarelto is a blood thinner medicine that can cause bleeding. You should call your healthcare provider right away if you experience any of the following: ? Bleeding from an injury or your nose that does not stop. ? Unusual colored urine (red or dark brown) or unusual colored stools (red or black). ? Unusual bruising for unknown reasons. ? A serious fall or if you hit your head (even if there is no bleeding).  Some medicines may interact with Xarelto and might increase your risk of bleeding while on Xarelto. To help avoid this, consult your healthcare provider or pharmacist prior to using any new prescription or non-prescription medications, including herbals, vitamins, non-steroidal anti-inflammatory drugs (NSAIDs) and supplements.  This website has more information on Xarelto: [email or web address]

## 2018-08-18 NOTE — Care Management CC44 (Signed)
Condition Code 44 Documentation Completed  Patient Details  Name: Craig Moore MRN: [id number] Date of Birth: [birth year]   Condition Code 44 given:  Yes Patient signature on Condition Code 44 notice:  Yes Documentation of 2 MD's agreement:  Yes Code 44 added to claim:  Yes    Royston Bake, RN 08/18/2018, 1:46 PM

## 2018-08-18 NOTE — Progress Notes (Signed)
Family Medicine Teaching Service Daily Progress Note Intern Pager: [phone]  Patient name: Craig Moore record number: [id number] Date of birth: 20-Nov-1941 Age: 77 y.o. Gender: male  Primary Care Provider: Lester Bing, DO Consultants: None Code Status: Full  Pt Overview and Major Events to Date:  9/11 - Admitted  Assessment and Plan: Craig Moore is a 77 y.o. male presenting with one day of cough and shortness of breath. 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 No calf pain or tenderness or LE swelling. Hx of CHF and mild elevation in BNP, Echo performed showing mild LVH, EF 60-65%; Troponin neg x3; net 440cc intake since admission. Weight 70.3kg/155lbs on admission, 67.2kg/148.1 lbs on 08/18/2018 (net 7lb loss since admission) - Routine vitals - Daily weights, I/Os, fluid restriction, heart healthy diet - IV Lasix 40mg  daily - Dounebs TID - Azithromycin 250 - Cont Metoprolol 25mg  daily, Amlodipine 10 - Spiriva - Prednisone 50 PO  Tobacco Use Disorder. Chronic: Patient smokes about a pack per day and has been decreasing his use.  - Nicotine patch 14mg  daily  Hyperlipidemia. Chronic. Stable. - Cont Atorvastatin 20mg  daily  HTN. Chronic. Stable: BP 161/94 on 9/10  - Amlodipine 10mg  daily - Metoprolol 25mg  daily - IV Lasix - routine bp checks  CAD s/p two stents placed in 2016. Chronic. Stable: Patient continues to smoke and has HTN and HLD. He is not diabetic. Troponins negative x 3. - ASA 81mg  daily - Nicotine patch, atorvastatin, and BP meds to reduce risk factors.  CKD stage III. Chronic. Stable: Cr close to BL (1.53-1.75) at 1.73 - Monitor with daily BMP  Hx of DVT. Chronic. Stable: Negative for calf or leg pain, stable vitals, and no erythema of lower extremities on  admission. - Cont to monitor for symptoms - Xarelto 20mg  daily, ASA daily  Hx of Hyperkalemia. Resolved: 4.1 and 4.4 this admission - Daily BMP  FEN/GI: heart healthy, renal fluid restriction Prophylaxis: On Xarelto for DVT prophylaxis  Disposition: Stable  Subjective:  Patient seen this morning resting in bed eating breakfast. He states he feels much better this morning. He denies headaches, fevers, chest pain, shortness of breath, nausea, vomiting, abdominal pain, and leg swelling. He reports having occasional dry cough.  Objective: Temp:  [98.2 F (36.8 C)-100.2 F (37.9 C)] 98.5 F (36.9 C) (09/12 0700) Pulse Rate:  [80-120] 80 (09/12 0916) Resp:  [15-28] 18 (09/12 0916) BP: (133-189)/(66-99) 161/94 (09/12 0700) SpO2:  [89 %-98 %] 97 % (09/12 0916) Weight:  [67.2 kg-69 kg] 67.2 kg (09/12 0610)  Physical Exam  Constitutional: He is oriented to person, place, and time. He appears well-developed and well-nourished. He does not appear ill. No distress.  HENT:  Head: Normocephalic and atraumatic.  Eyes: EOM are normal.  Neck: Normal range of motion.  Pulmonary/Chest: Effort normal. No respiratory distress.  Wheezes in RUL, RML, RLL; decreased breath sounds in LUQ; crackles LLQ; no stridor or rhonchi; on RA.  Abdominal: Soft. Bowel sounds are normal. There is no tenderness.  Musculoskeletal: Normal range of motion.       Right lower leg: He exhibits no edema.       Left lower leg: He exhibits no edema.  Lymphadenopathy:    He has no cervical adenopathy.  Neurological: He is alert and oriented to person, place, and time.  Skin: Skin is warm and dry. Capillary refill takes less than 2 seconds.  Psychiatric: He has a normal mood and affect.   Laboratory: Recent Labs  Lab 08/17/18 0736 08/18/18 0333  WBC 5.8 4.1  HGB 12.0* 12.2*  HCT 36.5* 36.5*  PLT 193 192   Recent Labs  Lab 08/17/18 0736 08/18/18 0333  NA 135 136  K 4.1 4.4  CL 100 103  CO2 20* 22  BUN 7*  12  CREATININE 1.76* 1.73*  CALCIUM 8.5* 8.5*  PROT 6.8  --   BILITOT 0.8  --   ALKPHOS 111  --   ALT 16  --   AST 26  --   GLUCOSE 108* 154*    B12 1072 H Folate wnl GFR 43 TSH 1.324 N  Imaging/Diagnostic Tests: Dg Chest 2 View  Result Date: 08/17/2018 CLINICAL DATA:  Cough and shortness of breath. EXAM: CHEST - 2 VIEW COMPARISON:  08/14/2014 FINDINGS: The lungs are clear without focal pneumonia, edema, pneumothorax or pleural effusion. Nodular density seen posterior lung base on lateral film unchanged since 08/14/2014. The visualized bony structures of the thorax are intact. Telemetry leads overlie the chest. IMPRESSION: No active cardiopulmonary disease. Electronically Signed   By: Misty Stanley M.D.   On: 08/17/2018 08:23    Daisy Floro, DO 08/18/2018, 9:34 AM PGY-1, [place] Intern pager: [phone], text pages welcome

## 2018-08-18 NOTE — Discharge Summary (Signed)
[facility] Discharge Summary  Patient name: Craig Moore record number: [id number] Date of birth: 1941-12-05 Age: 77 y.o. Gender: male Date of Admission: 08/17/2018  Date of Discharge: 08/18/18 Admitting Physician: Nuala Alpha, DO  Primary Care Provider: Conway Bing, DO Consultants: none  Indication for Hospitalization: ACS rule out  Discharge Diagnoses/Problem List:  COPD CHF CKDIII CAD s/p 2 stents HTN HLD DVT on Anticoagulation Tobacco Use Disorder  Disposition: Discharge home  Discharge Condition: Stable  Discharge Exam:  Physical Exam  Constitutional: He is oriented to person, place, and time. He appears well-developed and well-nourished. He does not appear ill.  HENT:  Head: Normocephalic.  Eyes: EOM are normal.  Cardiovascular: Normal rate and regular rhythm.  No murmur heard. Pulmonary/Chest: Effort normal. No respiratory distress.  Wheezes in RUL, RML, RLL; decreased breath sounds in LUQ; crackles LLQ; no stridor or rhonchi; on Room Air  Abdominal: Soft. Bowel sounds are normal.  Musculoskeletal: Normal range of motion.  Neurological: He is alert and oriented to person, place, and time.  Skin: Skin is warm and dry. Capillary refill takes less than 2 seconds.  Psychiatric: He has a normal mood and affect. His behavior is normal.   Brief Hospital Course:  Craig Moore is a 77 year old male admitted for 1 day of cough and shortness of breath concerning for CHF exacerbation versus COPD exacerbation.  The patient was started on Lasix, Duonebs, steroids and Azithromycin. The patient continued to improve throughout his hospital stay and his shortness of breath subsided. At one point it was reported by nursing staff that he had an episode of atrial fibrillation, but EKG and tele strips did not support this finding. The patient was counseled on smoking cessation but has no intention of quitting at this time. He continued to  improve and was medically cleared for discharge home on 08/18/2018.  Issues for Follow Up:  1. Patient with likely COPD, needs PFT's as outpatient 2. Follow up with smoking cessation 3. Continue Spiriva for COPD management  Significant Procedures: None  Significant Labs and Imaging:  Recent Labs  Lab 08/17/18 0736 08/18/18 0333  WBC 5.8 4.1  HGB 12.0* 12.2*  HCT 36.5* 36.5*  PLT 193 192   Recent Labs  Lab 08/17/18 0736 08/18/18 0333  NA 135 136  K 4.1 4.4  CL 100 103  CO2 20* 22  GLUCOSE 108* 154*  BUN 7* 12  CREATININE 1.76* 1.73*  CALCIUM 8.5* 8.5*  ALKPHOS 111  --   AST 26  --   ALT 16  --   ALBUMIN 3.6  --    B12 1072 H Folate wnl GFR 43 TSH 1.324 N  Results/Tests Pending at Time of Discharge: None  Discharge Medications:  Allergies as of 08/18/2018   No Known Allergies     Medication List    TAKE these medications   amLODipine 10 MG tablet Commonly known as:  NORVASC Take 1 tablet (10 mg total) by mouth daily.   aspirin EC 81 MG tablet Take 81 mg by mouth daily.   atorvastatin 20 MG tablet Commonly known as:  LIPITOR TAKE 1 TABLET EVERY DAY AT 6 PM. Please keep upcoming appt in October for future refills. Thank you What changed:    how much to take  how to take this  when to take this   azithromycin 250 MG tablet Commonly known as:  ZITHROMAX Take 1 tablet (250 mg total) by mouth daily for 3 days.  b complex vitamins capsule Take 1 capsule by mouth daily.   folic acid 1 MG tablet Commonly known as:  FOLVITE Take 1 mg by mouth daily.   furosemide 20 MG tablet Commonly known as:  LASIX TAKE 1 TAB DAILY AS NEEDED FOR DOE, SOB, EDEMA, OR WEIGHT GAIN >3 POUNDS OVERNIGHT OR 5 POUNDS/WK What changed:  See the new instructions.   HYDROcodone-acetaminophen 5-325 MG tablet Commonly known as:  NORCO/VICODIN Take 1 tablet by mouth every 6 (six) hours as needed. for pain   metoprolol succinate 25 MG 24 hr tablet Commonly known as:   TOPROL-XL Take 1 tablet (25 mg total) by mouth daily.   nicotine 21 mg/24hr patch Commonly known as:  NICODERM CQ - dosed in mg/24 hours Place 1 patch (21 mg total) onto the skin daily.   nicotine polacrilex 4 MG gum Commonly known as:  NICORETTE Take 1 each (4 mg total) by mouth as needed for smoking cessation.   predniSONE 50 MG tablet Commonly known as:  DELTASONE Take 1 tablet (50 mg total) by mouth daily with breakfast for 4 days.   sildenafil 100 MG tablet Commonly known as:  VIAGRA Take 0.5 tablets (50 mg total) by mouth daily as needed for erectile dysfunction.   tiotropium 18 MCG inhalation capsule Commonly known as:  SPIRIVA Place 1 capsule (18 mcg total) into inhaler and inhale daily. Start taking on:  08/19/2018   XARELTO 20 MG Tabs tablet Generic drug:  rivaroxaban TAKE 1 TABLET (20 MG TOTAL) BY MOUTH DAILY WITH SUPPER. What changed:  See the new instructions.       Discharge Instructions: Please refer to Patient Instructions section of EMR for full details.  Patient was counseled important signs and symptoms that should prompt return to medical care, changes in medications, dietary instructions, activity restrictions, and follow up appointments.   Follow-Up Appointments: Cardiology Appt 09/19/2018 with Dr. Liane Comber, 8:40am   Milus Banister, [place], PGY-1 08/18/2018 12:35 PM

## 2018-08-18 NOTE — Care Management Obs Status (Signed)
[place] NOTIFICATION   Patient Details  Name: Mordechai Matuszak MRN: [id number] Date of Birth: 04/15/1941   Medicare Observation Status Notification Given:  Yes    Royston Bake, RN 08/18/2018, 1:46 PM

## 2018-08-25 ENCOUNTER — Encounter: Payer: Self-pay | Admitting: Family Medicine

## 2018-08-25 ENCOUNTER — Ambulatory Visit (INDEPENDENT_AMBULATORY_CARE_PROVIDER_SITE_OTHER): Payer: Medicare Other | Admitting: Family Medicine

## 2018-08-25 VITALS — BP 125/75 | HR 77 | Temp 99.0°F | Wt 157.2 lb

## 2018-08-25 DIAGNOSIS — J441 Chronic obstructive pulmonary disease with (acute) exacerbation: Secondary | ICD-10-CM | POA: Diagnosis not present

## 2018-08-25 DIAGNOSIS — F17200 Nicotine dependence, unspecified, uncomplicated: Secondary | ICD-10-CM | POA: Diagnosis not present

## 2018-08-25 DIAGNOSIS — I5033 Acute on chronic diastolic (congestive) heart failure: Secondary | ICD-10-CM

## 2018-08-25 DIAGNOSIS — Z23 Encounter for immunization: Secondary | ICD-10-CM

## 2018-08-25 NOTE — Assessment & Plan Note (Signed)
Resolved.  Completed steroid and antibiotic treatment.  Patient reports no prior history of COPD.  He is a current everyday smoker and is not yet ready to quit.  He is not requiring albuterol and has been nonadherent to his controller medication.  Lungs appear to be clear today and he has no cough. - Scheduled appointment for PFT with Dr. Valentina Lucks - Discussed reasons for using albuterol as needed - Provided counseling on smoking cessation  - Administered annual flu shot and given [id number]

## 2018-08-25 NOTE — Assessment & Plan Note (Signed)
Chronic.  Current everyday user with minimal use of cigarettes daily.  Asymptomatic.  Not ready to quit, poor candidate for cessation today. - Discussed importance of smoking cessation

## 2018-08-25 NOTE — Progress Notes (Signed)
   Subjective   Patient ID: Craig Moore    DOB: Feb 19, 1941, 77 y.o. male   MRN: [id number]  CC: "Hospital follow-up"  HPI: Craig Moore is a 77 y.o. male who presents to clinic today for the following:  SOB: Patient is here today for hospital follow-up after covering from a COPD exacerbation versus possible heart failure exacerbation with symptoms of shortness of breath and cough.  He did receive an echocardiogram showing preserved EF without any motion abnormalities.  He was treated with prednisone and azithromycin along with duo nebs and began Lasix 20 mg daily.  He was instructed to continue Spiriva for controller and advised to quit smoking.  Patient reports complete resolution of symptoms following discharge.  He has not needed any additional Lasix since discharge.  He is not taking his albuterol and did not start the Spiriva.  Patient reports not taking cigarettes for the last 2 months but he is not ready to quit yet.  He has a scheduled follow-up with cardiology on 09/19/2018.  ROS: see HPI for pertinent.  [place]: HFpEF, CAD, COPD, HTN, smoker, OA, ED, anemia, umbilical hernia.  Surgical history high, coronary angioplasty.  Family history heart disease, stroke, DM, cancer (mother and sister, breast).  Smoking status reviewed. Medications reviewed.  Objective   BP 125/75 (BP Location: Left Arm, Patient Position: Sitting, Cuff Size: Normal)   Pulse 77   Temp 99 F (37.2 C) (Oral)   Wt 157 lb 3.2 oz (71.3 kg)   SpO2 98%   BMI 22.56 kg/m  Vitals and nursing note reviewed.  General: well nourished, well developed, NAD with non-toxic appearance HEENT: normocephalic, atraumatic, moist mucous membranes Neck: supple, non-tender without lymphadenopathy, no JVD Cardiovascular: regular rate and rhythm without murmurs, rubs, or gallops Lungs: clear to auscultation bilaterally with normal work of breathing Abdomen: soft, non-tender, non-distended, normoactive bowel sounds Skin: warm, dry, no  rashes or lesions, cap refill < 2 seconds Extremities: warm and well perfused, normal tone, no edema  Assessment & Plan   CHF exacerbation (HCC) Resolved.  Echo without new findings with preserved EF.  Followed by cardiology.  Has Lasix but not requiring for symptom control.  Appears euvolemic but weight is up 10 pounds from discharge. - Discussed reasons for using Lasix 20 mg daily - Reviewed return precautions - Follow-up with cardiology  COPD exacerbation ([place]) Resolved.  Completed steroid and antibiotic treatment.  Patient reports no prior history of COPD.  He is a current everyday smoker and is not yet ready to quit.  He is not requiring albuterol and has been nonadherent to his controller medication.  Lungs appear to be clear today and he has no cough. - Scheduled appointment for PFT with Dr. Valentina Lucks - Discussed reasons for using albuterol as needed - Provided counseling on smoking cessation  - Administered annual flu shot and given PPSV23  Tobacco use disorder Chronic.  Current everyday user with minimal use of cigarettes daily.  Asymptomatic.  Not ready to quit, poor candidate for cessation today. - Discussed importance of smoking cessation  No orders of the defined types were placed in this encounter.  No orders of the defined types were placed in this encounter.   [name], [place], PGY-3 08/25/2018, 12:14 PM

## 2018-08-25 NOTE — Assessment & Plan Note (Signed)
Resolved.  Echo without new findings with preserved EF.  Followed by cardiology.  Has Lasix but not requiring for symptom control.  Appears euvolemic but weight is up 10 pounds from discharge. - Discussed reasons for using Lasix 20 mg daily - Reviewed return precautions - Follow-up with cardiology

## 2018-08-25 NOTE — Addendum Note (Signed)
Addended by: Leonia Corona R on: 08/25/2018 12:26 PM   Modules accepted: Orders

## 2018-08-25 NOTE — Patient Instructions (Addendum)
Thank you for coming in to see Craig Moore today. Please see below to review our plan for today's visit.  Your biggest problem is your smoking.  It is important that you stop to prevent progression of lung and heart disease.  Please let Craig Moore know if you need assistance with medications at any point.  We do need to have your lungs evaluated with a pulmonary function test.  We [name] set up an appointment with Dr. Valentina Lucks to have this done.  Follow-up with your cardiologist and take the Lasix if you begin gaining weight or have any symptoms of shortness of breath.  I [name] see you again in 1 months or sooner if needed.  Please call the clinic at [phone] if your symptoms worsen or you have any concerns. It was our pleasure to serve you.  Craig Moore, [place], PGY-3

## 2018-09-09 ENCOUNTER — Other Ambulatory Visit: Payer: Self-pay | Admitting: Family Medicine

## 2018-09-13 ENCOUNTER — Other Ambulatory Visit: Payer: Self-pay | Admitting: Cardiology

## 2018-09-13 MED ORDER — METOPROLOL SUCCINATE ER 25 MG PO TB24: 25.0000 mg | ORAL_TABLET | Freq: Every day | ORAL | 3 refills | Status: DC

## 2018-09-18 ENCOUNTER — Other Ambulatory Visit: Payer: Self-pay | Admitting: Cardiology

## 2018-09-19 ENCOUNTER — Encounter: Payer: Self-pay | Admitting: Cardiology

## 2018-09-19 ENCOUNTER — Ambulatory Visit (INDEPENDENT_AMBULATORY_CARE_PROVIDER_SITE_OTHER): Payer: Medicare Other | Admitting: Cardiology

## 2018-09-19 VITALS — BP 128/70 | HR 55 | Ht 70.0 in | Wt 160.0 lb

## 2018-09-19 DIAGNOSIS — E782 Mixed hyperlipidemia: Secondary | ICD-10-CM | POA: Diagnosis not present

## 2018-09-19 DIAGNOSIS — N183 Chronic kidney disease, stage 3 unspecified: Secondary | ICD-10-CM

## 2018-09-19 DIAGNOSIS — I10 Essential (primary) hypertension: Secondary | ICD-10-CM

## 2018-09-19 DIAGNOSIS — I2510 Atherosclerotic heart disease of native coronary artery without angina pectoris: Secondary | ICD-10-CM | POA: Diagnosis not present

## 2018-09-19 NOTE — Progress Notes (Signed)
Patient ID: Craig Moore, male   DOB: Feb 25, 1941, 77 y.o.   MRN: [id number]      Cardiology Office Note  Date:  09/19/2018   ID:  Craig Moore, DOB 29-Jul-1941, MRN [id number]  PCP:  Merriman Bing, DO  Cardiologist:  Ena Dawley, MD (previously Dr Ron Parker)  Chief complain: Cough  History of Present Illness: Craig Moore is a 77 y.o. male who presents for followup coronary artery disease. He received a bare-metal stent in 2006. Nuclear study in 2012 revealed no ischemia. He has good LV function. 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 Patient is coming after 2 months, he was hospitalized in September with COPD exacerbation treated with respiratory therapy and azithromycin.  His symptoms have improved but he continues to have cough and feeling that he cannot cough up his phlegm.  He denies any orthopnea proximal nocturnal dyspnea, no lower extremity edema.  No chest pain.  No dizziness or syncope.   Past Medical History:  Diagnosis Date  . CAD (coronary artery disease)    Non-STEMI May, 2006, bare-metal stent  //   nuclear, January, 2012, EF 70%, no ischemia  . CHF (congestive heart failure) ([place])   . CKD (chronic kidney disease), stage III ([place])   . DVT (deep venous thrombosis) ([place])    December, 2011, Coumadin therapy  . Dyspnea   . Ejection fraction    EF 55%, echo, October, 2006, technically difficult  //  EF 70%, nuclear, January, 2012  . Groin pain    June, 2013  . Hyperlipidemia   . Hypertension   . Myocardial infarction ([place])   . Pre-syncope    ER, January, 2011, dehydration  . Tobacco abuse    Past Surgical History:  Procedure Laterality Date  . CORONARY ANGIOPLASTY  2006  . EYE SURGERY     Current Outpatient Medications  Medication Sig Dispense Refill  . albuterol (PROVENTIL HFA;VENTOLIN HFA) 108 (90 Base) MCG/ACT inhaler Inhale 2 puffs into the lungs every 6 (six) hours as needed for wheezing or shortness of breath. 1 Inhaler 2  . amLODipine  (NORVASC) 10 MG tablet Take 1 tablet (10 mg total) by mouth daily. 30 tablet 6  . aspirin EC 81 MG tablet Take 81 mg by mouth daily.    Marland Kitchen atorvastatin (LIPITOR) 20 MG tablet TAKE 1 TABLET EVERY DAY AT 6 PM. Please keep upcoming appt in October for future refills. Thank you 90 tablet 0  . b complex vitamins capsule Take 1 capsule by mouth daily.    . folic acid (FOLVITE) 1 MG tablet Take 1 mg by mouth daily.    . furosemide (LASIX) 20 MG tablet TAKE 1 TAB DAILY AS NEEDED FOR DOE, SOB, EDEMA, OR WEIGHT GAIN >3 POUNDS OVERNIGHT OR 5 POUNDS/WK 90 tablet 3  . metoprolol succinate (TOPROL-XL) 25 MG 24 hr tablet Take 1 tablet (25 mg total) by mouth daily. 90 tablet 3  . nicotine (NICODERM CQ) 21 mg/24hr patch Place 1 patch (21 mg total) onto the skin daily. 28 patch 0  . nicotine polacrilex (RA NICOTINE GUM) 4 MG gum Take 1 each (4 mg total) by mouth as needed for smoking cessation. 100 tablet 0  . sildenafil (VIAGRA) 100 MG tablet Take 0.5 tablets (50 mg total) by mouth daily as needed for erectile dysfunction. 10 tablet 11  . SPIRIVA HANDIHALER 18 MCG inhalation capsule INHALE 1 CAPSULE VIA HANDIHALER ONCE DAILY AT THE SAME TIME EVERY DAY 30 capsule 3  .  XARELTO 20 MG TABS tablet TAKE 1 TABLET (20 MG TOTAL) BY MOUTH DAILY WITH SUPPER. 90 tablet 3   No current facility-administered medications for this visit.    Allergies:   Patient has no known allergies.   Social History:  The patient  reports that he has been smoking cigarettes. He has a 13.75 pack-year smoking history. He has never used smokeless tobacco. He reports that he drinks about 8.0 standard drinks of alcohol per week. He reports that he does not use drugs.   Family History:  The patient's family history includes Breast cancer in his sister; Cancer in his mother; Diabetes in his father; Heart disease in his father; Stroke in his father.   ROS:  Please see the history of present illness.   Otherwise, review of systems are positive for none.    All other systems are reviewed and negative.   PHYSICAL EXAM: VS:  BP 128/70   Pulse (!) 55   Ht 5\' 10"  (1.778 m)   Wt 160 lb (72.6 kg)   BMI 22.96 kg/m  , BMI Body mass index is 22.96 kg/m. GEN: Well nourished, well developed, in no acute distress  HEENT: normal  Neck: no JVD, carotid bruits, or masses Cardiac: RRR; no murmurs, rubs, or gallops,no edema  Respiratory:  clear to auscultation bilaterally, normal work of breathing GI: soft, nontender, nondistended, + BS MS: no deformity or atrophy  Skin: warm and dry, no rash Neuro:  Strength and sensation are intact Psych: euthymic mood, full affect  Recent Labs: 08/17/2018: ALT 16; B Natriuretic Peptide 217.4; TSH 1.324 08/18/2018: BUN 12; Creatinine, Ser 1.73; Hemoglobin 12.2; Platelets 192; Potassium 4.4; Sodium 136   Lipid Panel    Component Value Date/Time   CHOL 129 07/11/2018 1138   TRIG 127 07/11/2018 1138   HDL 63 07/11/2018 1138   CHOLHDL 2.0 07/11/2018 1138   CHOLHDL 2.1 08/19/2015 1508   VLDL 22 08/19/2015 1508   LDLCALC 41 07/11/2018 1138   LDLDIRECT 67 01/13/2013 1527   Wt Readings from Last 3 Encounters:  09/19/18 160 lb (72.6 kg)  08/25/18 157 lb 3.2 oz (71.3 kg)  08/18/18 148 lb 1.6 oz (67.2 kg)    EKG: Mobitz type I, this is new when compared to the prior EKG, this was personally reviewed.   ASSESSMENT AND PLAN:  1. Coronary artery disease - status post PCI in 2006, He is asymptomatic, therefore no ischemic workup indicated at this point. I would continue aspirin, atorvastatin, lisinopril, but discontinue metoprolol because of the new Mobitz type I block.    2.  COPD -advised to stop smoking, I would also discontinue metoprolol.  3. Hypertensive heart disease without heart failure, blood pressure now controlled.  4. Lipids - all at goal  atorvastatin 20 mg daily.  Tolerated well.  5. CK D stage III - stable creatinine of 1.7 in September 2019.  We [name] refer to Dr. Justin Mend.  Follow-up in 6  months.  Signed, Ena Dawley, MD  09/19/2018 9:25 AM    [facility] [place], [place], [place]  [address] Phone: [phone]; Fax: [phone]

## 2018-09-19 NOTE — Patient Instructions (Signed)
Medication Instructions:   STOP TAKING METOPROLOL XL NOW  If you need a refill on your cardiac medications before your next appointment, please call your pharmacy.      You have been referred to NEPHROLOGY TO SEE Craig Moore     Follow-Up: At [facility], you and your health needs are our priority.  As part of our continuing mission to provide you with exceptional heart care, we have created designated Provider Care Teams.  These Care Teams include your primary Cardiologist (physician) and Advanced Practice Providers (APPs -  Physician Assistants and Nurse Practitioners) who all work together to provide you with the care you need, when you need it. [name] need a follow up appointment in 6 months.  Please call our office 2 months in advance to schedule this appointment.  You may see Ena Dawley, MD or one of the following Advanced Practice Providers on your designated Care Team:   [place], PA-C [name], PA-C . Ermalinda Barrios, PA-C

## 2018-10-07 ENCOUNTER — Telehealth: Payer: Self-pay | Admitting: *Deleted

## 2018-10-07 NOTE — Telephone Encounter (Signed)
AMB REFERRAL TO NEPHROLOGY  Received: Today  Message Contents  Jerlyn Ly, LPN        15-4-00 Per Luetta Nutting with [facility] - patient is rated a 3- appointment within 3-4 mths/saf

## 2018-10-19 DIAGNOSIS — H35342 Macular cyst, hole, or pseudohole, left eye: Secondary | ICD-10-CM | POA: Diagnosis not present

## 2018-11-02 ENCOUNTER — Telehealth: Payer: Self-pay | Admitting: *Deleted

## 2018-11-02 NOTE — Telephone Encounter (Signed)
AMB REFERRAL TO NEPHROLOGY  Received: Today  Message Contents  Conley Simmonds, LPN        Appt scheduled with Dr Justin Mend on 11-24-18 at 4:15.

## 2018-11-24 DIAGNOSIS — R319 Hematuria, unspecified: Secondary | ICD-10-CM | POA: Diagnosis not present

## 2018-11-24 DIAGNOSIS — D631 Anemia in chronic kidney disease: Secondary | ICD-10-CM | POA: Diagnosis not present

## 2018-11-24 DIAGNOSIS — E785 Hyperlipidemia, unspecified: Secondary | ICD-10-CM | POA: Diagnosis not present

## 2018-11-24 DIAGNOSIS — N183 Chronic kidney disease, stage 3 (moderate): Secondary | ICD-10-CM | POA: Diagnosis not present

## 2018-11-24 DIAGNOSIS — I2510 Atherosclerotic heart disease of native coronary artery without angina pectoris: Secondary | ICD-10-CM | POA: Diagnosis not present

## 2019-01-17 ENCOUNTER — Other Ambulatory Visit: Payer: Self-pay | Admitting: Physician Assistant

## 2019-02-11 ENCOUNTER — Other Ambulatory Visit: Payer: Self-pay | Admitting: Family Medicine

## 2019-02-23 DIAGNOSIS — D631 Anemia in chronic kidney disease: Secondary | ICD-10-CM | POA: Diagnosis not present

## 2019-02-23 DIAGNOSIS — E785 Hyperlipidemia, unspecified: Secondary | ICD-10-CM | POA: Diagnosis not present

## 2019-02-23 DIAGNOSIS — N183 Chronic kidney disease, stage 3 (moderate): Secondary | ICD-10-CM | POA: Diagnosis not present

## 2019-02-23 DIAGNOSIS — I2510 Atherosclerotic heart disease of native coronary artery without angina pectoris: Secondary | ICD-10-CM | POA: Diagnosis not present

## 2019-02-23 DIAGNOSIS — R319 Hematuria, unspecified: Secondary | ICD-10-CM | POA: Diagnosis not present

## 2019-05-31 ENCOUNTER — Other Ambulatory Visit: Payer: Self-pay

## 2019-05-31 MED ORDER — ALBUTEROL SULFATE HFA 108 (90 BASE) MCG/ACT IN AERS: 2.0000 | INHALATION_SPRAY | Freq: Four times a day (QID) | RESPIRATORY_TRACT | 0 refills | Status: DC | PRN

## 2019-09-02 ENCOUNTER — Other Ambulatory Visit: Payer: Self-pay | Admitting: Cardiology

## 2019-09-26 ENCOUNTER — Other Ambulatory Visit: Payer: Self-pay | Admitting: Cardiology

## 2019-10-17 ENCOUNTER — Other Ambulatory Visit: Payer: Self-pay | Admitting: Physician Assistant

## 2019-10-20 ENCOUNTER — Other Ambulatory Visit: Payer: Self-pay | Admitting: Cardiology

## 2019-10-26 ENCOUNTER — Other Ambulatory Visit: Payer: Self-pay

## 2019-10-26 ENCOUNTER — Encounter: Payer: Self-pay | Admitting: Cardiology

## 2019-10-26 ENCOUNTER — Ambulatory Visit (INDEPENDENT_AMBULATORY_CARE_PROVIDER_SITE_OTHER): Payer: Medicare Other | Admitting: Cardiology

## 2019-10-26 ENCOUNTER — Other Ambulatory Visit: Payer: Self-pay | Admitting: Cardiology

## 2019-10-26 VITALS — BP 148/76 | HR 100 | Ht 70.0 in | Wt 155.8 lb

## 2019-10-26 DIAGNOSIS — E785 Hyperlipidemia, unspecified: Secondary | ICD-10-CM

## 2019-10-26 DIAGNOSIS — R0609 Other forms of dyspnea: Secondary | ICD-10-CM

## 2019-10-26 DIAGNOSIS — I10 Essential (primary) hypertension: Secondary | ICD-10-CM

## 2019-10-26 DIAGNOSIS — I119 Hypertensive heart disease without heart failure: Secondary | ICD-10-CM

## 2019-10-26 DIAGNOSIS — I2510 Atherosclerotic heart disease of native coronary artery without angina pectoris: Secondary | ICD-10-CM | POA: Diagnosis not present

## 2019-10-26 DIAGNOSIS — E782 Mixed hyperlipidemia: Secondary | ICD-10-CM | POA: Diagnosis not present

## 2019-10-26 DIAGNOSIS — R0600 Dyspnea, unspecified: Secondary | ICD-10-CM

## 2019-10-26 MED ORDER — RIVAROXABAN 20 MG PO TABS: ORAL_TABLET | ORAL | 3 refills | Status: DC

## 2019-10-26 MED ORDER — HYDRALAZINE HCL 25 MG PO TABS: 25.0000 mg | ORAL_TABLET | Freq: Three times a day (TID) | ORAL | 2 refills | Status: DC

## 2019-10-26 MED ORDER — ATORVASTATIN CALCIUM 20 MG PO TABS: ORAL_TABLET | ORAL | 0 refills | Status: DC

## 2019-10-26 NOTE — Patient Instructions (Addendum)
Medication Instructions:   STOP TAKING AMLODIPINE   START TAKING HYDRALAZINE 25 MG BY MOUTH THREE TIMES DAILY  *If you need a refill on your cardiac medications before your next appointment, please call your pharmacy*   You have been referred to [facility] 2 MONTHS   Your physician recommends that you return for lab work in: ON 11/10/2019 HERE AT OUR OFFICE TO CHECK CMET, CBC W DIFF, TSH, AND LIPIDS--PLEASE COME FASTING TO THIS LAB APPOINTMENT   Follow-Up:  2 MONTHS IN OUR BP CLINIC TO SEE OUR PHARMACIST   At [facility], you and your health needs are our priority.  As part of our continuing mission to provide you with exceptional heart care, we have created designated Provider Care Teams.  These Care Teams include your primary Cardiologist (physician) and Advanced Practice Providers (APPs -  Physician Assistants and Nurse Practitioners) who all work together to provide you with the care you need, when you need it.  Your next appointment:   6 month(s)  The format for your next appointment:   In Person  Provider:   Ena Dawley, MD

## 2019-10-26 NOTE — Progress Notes (Signed)
Patient ID: Craig Moore, male   DOB: 11-May-1941, 78 y.o.   MRN: [id number]      Cardiology Office Note  Date:  10/26/2019   ID:  Craig Moore, DOB 1941/03/05, MRN [id number]  PCP:  Caroline More, DO  Cardiologist:  Ena Dawley, MD (previously Dr Ron Parker)  Chief complain: Hypertension  History of Present Illness: Craig Moore is a 78 y.o. male who presents for followup coronary artery disease. He received a bare-metal stent in 2006. Nuclear study in 2012 revealed no ischemia. He has good LV function. 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 The patient is coming after year, he has been doing well and denies any chest pain or shortness of breath, he remains active, he stopped using amlodipine as it causing significant constipation.  His blood pressure has been elevated.  He is being followed by nephrologist for CKD stage III.  Last creatinine 1.7 in 2019 we Kadrian obtain new labs from his PCP.  He has been compliant with his medication and has no side effects otherwise.  Tolerating statins well.  He was diagnosed with hematuria and is going to be seen by urologist.  Past Medical History:  Diagnosis Date  . CAD (coronary artery disease)    Non-STEMI May, 2006, bare-metal stent  //   nuclear, January, 2012, EF 70%, no ischemia  . CHF (congestive heart failure) ([place])   . CKD (chronic kidney disease), stage III   . DVT (deep venous thrombosis) ([place])    December, 2011, Coumadin therapy  . Dyspnea   . Ejection fraction    EF 55%, echo, October, 2006, technically difficult  //  EF 70%, nuclear, January, 2012  . Groin pain    June, 2013  . Hyperlipidemia   . Hypertension   . Myocardial infarction ([place])   . Pre-syncope    ER, January, 2011, dehydration  . Tobacco abuse    Past Surgical History:  Procedure Laterality Date  . CORONARY ANGIOPLASTY  2006  . EYE SURGERY     Current Outpatient Medications  Medication Sig Dispense Refill  . albuterol (VENTOLIN HFA) 108 (90  Base) MCG/ACT inhaler Inhale 2 puffs into the lungs every 6 (six) hours as needed for wheezing or shortness of breath. 18 g 0  . amLODipine (NORVASC) 10 MG tablet Take 1 tablet (10 mg total) by mouth daily. MUST CALL [phone] TO SCHEDULE AN APPT TO CONT REFILLS. THANK YOU. 1ST ATTEMPT 30 tablet 0  . aspirin EC 81 MG tablet Take 81 mg by mouth daily.    Marland Kitchen atorvastatin (LIPITOR) 20 MG tablet TAKE 1 TABLET EVERY DAY AT 6 PM. Please make overdue appt with Dr. Meda Coffee before anymore refills. 3rd and Final Attempt 15 tablet 0  . b complex vitamins capsule Take 1 capsule by mouth daily.    . folic acid (FOLVITE) 1 MG tablet Take 1 mg by mouth daily.    . furosemide (LASIX) 20 MG tablet TAKE 1 TAB DAILY AS NEEDED FOR DOE, SOB, EDEMA, OR WEIGHT GAIN >3 POUNDS OVERNIGHT OR 5 POUNDS/WK 90 tablet 3  . sildenafil (VIAGRA) 100 MG tablet Take 0.5 tablets (50 mg total) by mouth daily as needed for erectile dysfunction. 10 tablet 11  . XARELTO 20 MG TABS tablet TAKE 1 TABLET (20 MG TOTAL) BY MOUTH DAILY WITH SUPPER. 90 tablet 3   No current facility-administered medications for this visit.    Allergies:   Patient has no known allergies.   Social History:  The patient  reports that he has been smoking cigarettes. He has a 13.75 pack-year smoking history. He has never used smokeless tobacco. He reports current alcohol use of about 8.0 standard drinks of alcohol per week. He reports that he does not use drugs.   Family History:  The patient's family history includes Breast cancer in his sister; Cancer in his mother; Diabetes in his father; Heart disease in his father; Stroke in his father.   ROS:  Please see the history of present illness.   Otherwise, review of systems are positive for none.   All other systems are reviewed and negative.   PHYSICAL EXAM: VS:  BP (!) 148/76   Pulse 100   Ht 5\' 10"  (1.778 m)   Wt 155 lb 12.8 oz (70.7 kg)   SpO2 97%   BMI 22.35 kg/m  , BMI Body mass index is 22.35 kg/m.  GEN: Well nourished, well developed, in no acute distress  HEENT: normal  Neck: no JVD, carotid bruits, or masses Cardiac: RRR; no murmurs, rubs, or gallops,no edema  Respiratory:  clear to auscultation bilaterally, normal work of breathing GI: soft, nontender, nondistended, + BS MS: no deformity or atrophy  Skin: warm and dry, no rash Neuro:  Strength and sensation are intact Psych: euthymic mood, full affect  Recent Labs: No results found for requested labs within last 8760 hours.   Lipid Panel    Component Value Date/Time   CHOL 129 07/11/2018 1138   TRIG 127 07/11/2018 1138   HDL 63 07/11/2018 1138   CHOLHDL 2.0 07/11/2018 1138   CHOLHDL 2.1 08/19/2015 1508   VLDL 22 08/19/2015 1508   LDLCALC 41 07/11/2018 1138   LDLDIRECT 67 01/13/2013 1527   Wt Readings from Last 3 Encounters:  10/26/19 155 lb 12.8 oz (70.7 kg)  09/19/18 160 lb (72.6 kg)  08/25/18 157 lb 3.2 oz (71.3 kg)    EKG: Mobitz type I, this is new when compared to the prior EKG, this was personally reviewed.   ASSESSMENT AND PLAN:  1. Coronary artery disease - status post PCI in 2006, He is asymptomatic, therefore no ischemic workup indicated at this point. I would continue aspirin, atorvastatin, no beta-blocker as he has Mobitz type I second-degree AV block.  I Neftali start hydralazine 25 mg p.o. 3 times daily for blood pressure management.      2.  COPD -advised to stop smoking.  3. Hypertensive heart disease without heart failure, start hydralazine 25 mg p.o. 3 times daily  4. Lipids -on atorvastatin 20 mg daily.  Tolerated well.  I Geoffery obtain lipids from his PCP.  5. CK D stage III - stable creatinine of 1.7 in September 2019.  We [place] obtain new labs.  He is being followed by Dr. Karsten Ro.  Follow-up in 2 months with blood pressure clinic, in 6 months with me.    Signed, Ena Dawley, MD  10/26/2019 10:15 AM    [facility] [address], [place], [place]  [address] Phone:  [phone]; Fax: [phone]

## 2019-11-07 ENCOUNTER — Other Ambulatory Visit: Payer: Self-pay | Admitting: Cardiology

## 2019-11-07 DIAGNOSIS — Z9289 Personal history of other medical treatment: Secondary | ICD-10-CM

## 2019-11-07 DIAGNOSIS — Z8719 Personal history of other diseases of the digestive system: Secondary | ICD-10-CM

## 2019-11-07 HISTORY — DX: Personal history of other medical treatment: Z92.89

## 2019-11-07 HISTORY — DX: Personal history of other diseases of the digestive system: Z87.19

## 2019-11-09 ENCOUNTER — Other Ambulatory Visit: Payer: Self-pay | Admitting: Cardiology

## 2019-11-10 ENCOUNTER — Other Ambulatory Visit: Payer: Medicare Other | Admitting: *Deleted

## 2019-11-10 ENCOUNTER — Other Ambulatory Visit: Payer: Self-pay

## 2019-11-10 DIAGNOSIS — I2510 Atherosclerotic heart disease of native coronary artery without angina pectoris: Secondary | ICD-10-CM

## 2019-11-10 DIAGNOSIS — I10 Essential (primary) hypertension: Secondary | ICD-10-CM | POA: Diagnosis not present

## 2019-11-10 DIAGNOSIS — E782 Mixed hyperlipidemia: Secondary | ICD-10-CM | POA: Diagnosis not present

## 2019-11-10 LAB — CBC WITH DIFFERENTIAL/PLATELET
Basophils Absolute: 0 10*3/uL (ref 0.0–0.2)
Basos: 1 %
EOS (ABSOLUTE): 0.2 10*3/uL (ref 0.0–0.4)
Eos: 6 %
Hematocrit: 35.4 % — ABNORMAL LOW (ref 37.5–51.0)
Hemoglobin: 12.7 g/dL — ABNORMAL LOW (ref 13.0–17.7)
Immature Grans (Abs): 0 10*3/uL (ref 0.0–0.1)
Immature Granulocytes: 0 %
Lymphocytes Absolute: 1.1 10*3/uL (ref 0.7–3.1)
Lymphs: 28 %
MCH: 36.9 pg — ABNORMAL HIGH (ref 26.6–33.0)
MCHC: 35.9 g/dL — ABNORMAL HIGH (ref 31.5–35.7)
MCV: 103 fL — ABNORMAL HIGH (ref 79–97)
Monocytes Absolute: 0.5 10*3/uL (ref 0.1–0.9)
Monocytes: 14 %
Neutrophils Absolute: 1.9 10*3/uL (ref 1.4–7.0)
Neutrophils: 51 %
Platelets: 228 10*3/uL (ref 150–450)
RBC: 3.44 x10E6/uL — ABNORMAL LOW (ref 4.14–5.80)
RDW: 12.8 % (ref 11.6–15.4)
WBC: 3.8 10*3/uL (ref 3.4–10.8)

## 2019-11-10 LAB — COMPREHENSIVE METABOLIC PANEL
ALT: 16 IU/L (ref 0–44)
AST: 26 IU/L (ref 0–40)
Albumin/Globulin Ratio: 1.7 (ref 1.2–2.2)
Albumin: 4.2 g/dL (ref 3.7–4.7)
Alkaline Phosphatase: 170 IU/L — ABNORMAL HIGH (ref 39–117)
BUN/Creatinine Ratio: 5 — ABNORMAL LOW (ref 10–24)
BUN: 6 mg/dL — ABNORMAL LOW (ref 8–27)
Bilirubin Total: 0.4 mg/dL (ref 0.0–1.2)
CO2: 22 mmol/L (ref 20–29)
Calcium: 8.6 mg/dL (ref 8.6–10.2)
Chloride: 98 mmol/L (ref 96–106)
Creatinine, Ser: 1.19 mg/dL (ref 0.76–1.27)
GFR calc Af Amer: 68 mL/min/{1.73_m2} (ref >59)
GFR calc non Af Amer: 59 mL/min/{1.73_m2} — ABNORMAL LOW (ref >59)
Globulin, Total: 2.5 g/dL (ref 1.5–4.5)
Glucose: 103 mg/dL — ABNORMAL HIGH (ref 65–99)
Potassium: 4.4 mmol/L (ref 3.5–5.2)
Sodium: 132 mmol/L — ABNORMAL LOW (ref 134–144)
Total Protein: 6.7 g/dL (ref 6.0–8.5)

## 2019-11-10 LAB — LIPID PANEL
Chol/HDL Ratio: 1.6 ratio (ref 0.0–5.0)
Cholesterol, Total: 156 mg/dL (ref 100–199)
HDL: 96 mg/dL (ref >39)
LDL Chol Calc (NIH): 47 mg/dL (ref 0–99)
Triglycerides: 69 mg/dL (ref 0–149)
VLDL Cholesterol Cal: 13 mg/dL (ref 5–40)

## 2019-11-10 LAB — TSH: TSH: 1.65 u[IU]/mL (ref 0.450–4.500)

## 2019-11-27 ENCOUNTER — Ambulatory Visit (INDEPENDENT_AMBULATORY_CARE_PROVIDER_SITE_OTHER)
Admission: EM | Admit: 2019-11-27 | Discharge: 2019-11-27 | Disposition: A | Discharge status: Home / Self Care | Payer: Medicare Other | Source: Home / Self Care | Attending: Internal Medicine | Admitting: Internal Medicine

## 2019-11-27 ENCOUNTER — Observation Stay (HOSPITAL_COMMUNITY): Payer: Medicare Other

## 2019-11-27 ENCOUNTER — Encounter (HOSPITAL_COMMUNITY): Payer: Self-pay | Admitting: Emergency Medicine

## 2019-11-27 ENCOUNTER — Emergency Department (HOSPITAL_COMMUNITY): Payer: Medicare Other

## 2019-11-27 ENCOUNTER — Observation Stay (HOSPITAL_COMMUNITY)
Admission: EM | Admit: 2019-11-27 | Discharge: 2019-11-29 | Disposition: A | Discharge status: Home / Self Care | Payer: Medicare Other | Attending: Gastroenterology | Admitting: Gastroenterology

## 2019-11-27 ENCOUNTER — Telehealth: Payer: Medicare Other | Admitting: Family Medicine

## 2019-11-27 ENCOUNTER — Encounter (HOSPITAL_COMMUNITY): Payer: Self-pay

## 2019-11-27 ENCOUNTER — Other Ambulatory Visit: Payer: Self-pay

## 2019-11-27 DIAGNOSIS — K829 Disease of gallbladder, unspecified: Secondary | ICD-10-CM | POA: Insufficient documentation

## 2019-11-27 DIAGNOSIS — R110 Nausea: Secondary | ICD-10-CM

## 2019-11-27 DIAGNOSIS — R0602 Shortness of breath: Secondary | ICD-10-CM

## 2019-11-27 DIAGNOSIS — K5730 Diverticulosis of large intestine without perforation or abscess without bleeding: Secondary | ICD-10-CM | POA: Insufficient documentation

## 2019-11-27 DIAGNOSIS — K269 Duodenal ulcer, unspecified as acute or chronic, without hemorrhage or perforation: Secondary | ICD-10-CM

## 2019-11-27 DIAGNOSIS — Z86718 Personal history of other venous thrombosis and embolism: Secondary | ICD-10-CM | POA: Diagnosis not present

## 2019-11-27 DIAGNOSIS — Z833 Family history of diabetes mellitus: Secondary | ICD-10-CM | POA: Insufficient documentation

## 2019-11-27 DIAGNOSIS — M160 Bilateral primary osteoarthritis of hip: Secondary | ICD-10-CM | POA: Insufficient documentation

## 2019-11-27 DIAGNOSIS — Z7982 Long term (current) use of aspirin: Secondary | ICD-10-CM | POA: Insufficient documentation

## 2019-11-27 DIAGNOSIS — R531 Weakness: Secondary | ICD-10-CM

## 2019-11-27 DIAGNOSIS — D689 Coagulation defect, unspecified: Secondary | ICD-10-CM

## 2019-11-27 DIAGNOSIS — I252 Old myocardial infarction: Secondary | ICD-10-CM | POA: Insufficient documentation

## 2019-11-27 DIAGNOSIS — Z20828 Contact with and (suspected) exposure to other viral communicable diseases: Secondary | ICD-10-CM | POA: Diagnosis not present

## 2019-11-27 DIAGNOSIS — Z803 Family history of malignant neoplasm of breast: Secondary | ICD-10-CM | POA: Insufficient documentation

## 2019-11-27 DIAGNOSIS — I130 Hypertensive heart and chronic kidney disease with heart failure and stage 1 through stage 4 chronic kidney disease, or unspecified chronic kidney disease: Secondary | ICD-10-CM | POA: Diagnosis not present

## 2019-11-27 DIAGNOSIS — J441 Chronic obstructive pulmonary disease with (acute) exacerbation: Secondary | ICD-10-CM | POA: Diagnosis not present

## 2019-11-27 DIAGNOSIS — N183 Chronic kidney disease, stage 3 unspecified: Secondary | ICD-10-CM | POA: Diagnosis not present

## 2019-11-27 DIAGNOSIS — D649 Anemia, unspecified: Secondary | ICD-10-CM

## 2019-11-27 DIAGNOSIS — K449 Diaphragmatic hernia without obstruction or gangrene: Secondary | ICD-10-CM | POA: Insufficient documentation

## 2019-11-27 DIAGNOSIS — I700 Atherosclerosis of aorta: Secondary | ICD-10-CM | POA: Diagnosis not present

## 2019-11-27 DIAGNOSIS — Z8719 Personal history of other diseases of the digestive system: Secondary | ICD-10-CM

## 2019-11-27 DIAGNOSIS — F17210 Nicotine dependence, cigarettes, uncomplicated: Secondary | ICD-10-CM | POA: Diagnosis not present

## 2019-11-27 DIAGNOSIS — K921 Melena: Principal | ICD-10-CM | POA: Insufficient documentation

## 2019-11-27 DIAGNOSIS — Z7901 Long term (current) use of anticoagulants: Secondary | ICD-10-CM | POA: Insufficient documentation

## 2019-11-27 DIAGNOSIS — I2510 Atherosclerotic heart disease of native coronary artery without angina pectoris: Secondary | ICD-10-CM | POA: Diagnosis not present

## 2019-11-27 DIAGNOSIS — K2970 Gastritis, unspecified, without bleeding: Secondary | ICD-10-CM | POA: Insufficient documentation

## 2019-11-27 DIAGNOSIS — Z823 Family history of stroke: Secondary | ICD-10-CM | POA: Insufficient documentation

## 2019-11-27 DIAGNOSIS — R935 Abnormal findings on diagnostic imaging of other abdominal regions, including retroperitoneum: Secondary | ICD-10-CM

## 2019-11-27 DIAGNOSIS — M1990 Unspecified osteoarthritis, unspecified site: Secondary | ICD-10-CM | POA: Insufficient documentation

## 2019-11-27 DIAGNOSIS — I5032 Chronic diastolic (congestive) heart failure: Secondary | ICD-10-CM | POA: Diagnosis not present

## 2019-11-27 DIAGNOSIS — K922 Gastrointestinal hemorrhage, unspecified: Secondary | ICD-10-CM | POA: Diagnosis not present

## 2019-11-27 DIAGNOSIS — Z79899 Other long term (current) drug therapy: Secondary | ICD-10-CM | POA: Insufficient documentation

## 2019-11-27 DIAGNOSIS — Z955 Presence of coronary angioplasty implant and graft: Secondary | ICD-10-CM | POA: Insufficient documentation

## 2019-11-27 DIAGNOSIS — B9681 Helicobacter pylori [H. pylori] as the cause of diseases classified elsewhere: Secondary | ICD-10-CM | POA: Diagnosis not present

## 2019-11-27 DIAGNOSIS — E785 Hyperlipidemia, unspecified: Secondary | ICD-10-CM | POA: Diagnosis not present

## 2019-11-27 DIAGNOSIS — E875 Hyperkalemia: Secondary | ICD-10-CM | POA: Insufficient documentation

## 2019-11-27 DIAGNOSIS — K264 Chronic or unspecified duodenal ulcer with hemorrhage: Secondary | ICD-10-CM | POA: Insufficient documentation

## 2019-11-27 DIAGNOSIS — Z8249 Family history of ischemic heart disease and other diseases of the circulatory system: Secondary | ICD-10-CM | POA: Insufficient documentation

## 2019-11-27 DIAGNOSIS — K5900 Constipation, unspecified: Secondary | ICD-10-CM | POA: Insufficient documentation

## 2019-11-27 DIAGNOSIS — T50905A Adverse effect of unspecified drugs, medicaments and biological substances, initial encounter: Secondary | ICD-10-CM

## 2019-11-27 DIAGNOSIS — K828 Other specified diseases of gallbladder: Secondary | ICD-10-CM | POA: Diagnosis present

## 2019-11-27 DIAGNOSIS — R932 Abnormal findings on diagnostic imaging of liver and biliary tract: Secondary | ICD-10-CM | POA: Diagnosis not present

## 2019-11-27 DIAGNOSIS — D500 Iron deficiency anemia secondary to blood loss (chronic): Secondary | ICD-10-CM | POA: Diagnosis not present

## 2019-11-27 LAB — CBC WITH DIFFERENTIAL/PLATELET
Abs Immature Granulocytes: 0.18 10*3/uL — ABNORMAL HIGH (ref 0.00–0.07)
Basophils Absolute: 0 10*3/uL (ref 0.0–0.1)
Basophils Relative: 0 %
Eosinophils Absolute: 0.3 10*3/uL (ref 0.0–0.5)
Eosinophils Relative: 3 %
HCT: 28.1 % — ABNORMAL LOW (ref 39.0–52.0)
Hemoglobin: 9.9 g/dL — ABNORMAL LOW (ref 13.0–17.0)
Immature Granulocytes: 2 %
Lymphocytes Relative: 6 %
Lymphs Abs: 0.6 10*3/uL — ABNORMAL LOW (ref 0.7–4.0)
MCH: 37.8 pg — ABNORMAL HIGH (ref 26.0–34.0)
MCHC: 35.2 g/dL (ref 30.0–36.0)
MCV: 107.3 fL — ABNORMAL HIGH (ref 80.0–100.0)
Monocytes Absolute: 0.4 10*3/uL (ref 0.1–1.0)
Monocytes Relative: 4 %
Neutro Abs: 8.7 10*3/uL — ABNORMAL HIGH (ref 1.7–7.7)
Neutrophils Relative %: 85 %
Platelets: 342 10*3/uL (ref 150–400)
RBC: 2.62 MIL/uL — ABNORMAL LOW (ref 4.22–5.81)
RDW: 12.6 % (ref 11.5–15.5)
WBC: 10.2 10*3/uL (ref 4.0–10.5)
nRBC: 0 % (ref 0.0–0.2)

## 2019-11-27 LAB — COMPREHENSIVE METABOLIC PANEL
ALT: 23 U/L (ref 0–44)
AST: 19 U/L (ref 15–41)
Albumin: 2.7 g/dL — ABNORMAL LOW (ref 3.5–5.0)
Alkaline Phosphatase: 148 U/L — ABNORMAL HIGH (ref 38–126)
Anion gap: 9 (ref 5–15)
BUN: 43 mg/dL — ABNORMAL HIGH (ref 8–23)
CO2: 21 mmol/L — ABNORMAL LOW (ref 22–32)
Calcium: 8.3 mg/dL — ABNORMAL LOW (ref 8.9–10.3)
Chloride: 103 mmol/L (ref 98–111)
Creatinine, Ser: 1.7 mg/dL — ABNORMAL HIGH (ref 0.61–1.24)
GFR calc Af Amer: 44 mL/min — ABNORMAL LOW (ref >60)
GFR calc non Af Amer: 38 mL/min — ABNORMAL LOW (ref >60)
Glucose, Bld: 125 mg/dL — ABNORMAL HIGH (ref 70–99)
Potassium: 4.4 mmol/L (ref 3.5–5.1)
Sodium: 133 mmol/L — ABNORMAL LOW (ref 135–145)
Total Bilirubin: 0.6 mg/dL (ref 0.3–1.2)
Total Protein: 6.8 g/dL (ref 6.5–8.1)

## 2019-11-27 LAB — I-STAT CHEM 8, ED
BUN: 40 mg/dL — ABNORMAL HIGH (ref 8–23)
Calcium, Ion: 1.04 mmol/L — ABNORMAL LOW (ref 1.15–1.40)
Chloride: 103 mmol/L (ref 98–111)
Creatinine, Ser: 1.5 mg/dL — ABNORMAL HIGH (ref 0.61–1.24)
Glucose, Bld: 120 mg/dL — ABNORMAL HIGH (ref 70–99)
HCT: 30 % — ABNORMAL LOW (ref 39.0–52.0)
Hemoglobin: 10.2 g/dL — ABNORMAL LOW (ref 13.0–17.0)
Potassium: 4.5 mmol/L (ref 3.5–5.1)
Sodium: 132 mmol/L — ABNORMAL LOW (ref 135–145)
TCO2: 20 mmol/L — ABNORMAL LOW (ref 22–32)

## 2019-11-27 LAB — SARS CORONAVIRUS 2 (TAT 6-24 HRS): SARS Coronavirus 2: NEGATIVE

## 2019-11-27 LAB — POC SARS CORONAVIRUS 2 AG: SARS Coronavirus 2 Ag: NEGATIVE

## 2019-11-27 LAB — PROTIME-INR
INR: 1.8 — ABNORMAL HIGH (ref 0.8–1.2)
Prothrombin Time: 20.8 seconds — ABNORMAL HIGH (ref 11.4–15.2)

## 2019-11-27 LAB — ABO/RH: ABO/RH(D): A NEG

## 2019-11-27 LAB — POC SARS CORONAVIRUS 2 AG -  ED: SARS Coronavirus 2 Ag: NEGATIVE

## 2019-11-27 MED ORDER — ACETAMINOPHEN 650 MG RE SUPP: 650.0000 mg | Freq: Four times a day (QID) | RECTAL | Status: DC | PRN

## 2019-11-27 MED ORDER — SODIUM CHLORIDE 0.9 % IV BOLUS
1000.0000 mL | Freq: Once | INTRAVENOUS | Status: AC
  Administered 2019-11-27: 1000 mL via INTRAVENOUS

## 2019-11-27 MED ORDER — SODIUM CHLORIDE 0.9 % IV SOLN: INTRAVENOUS | Status: DC

## 2019-11-27 MED ORDER — ATORVASTATIN CALCIUM 10 MG PO TABS
20.0000 mg | ORAL_TABLET | Freq: Every day | ORAL | Status: DC
  Administered 2019-11-27 – 2019-11-28 (×2): 20 mg via ORAL
  Filled 2019-11-27 (×2): qty 2

## 2019-11-27 MED ORDER — ALBUTEROL SULFATE (2.5 MG/3ML) 0.083% IN NEBU: 3.0000 mL | INHALATION_SOLUTION | Freq: Four times a day (QID) | RESPIRATORY_TRACT | Status: DC | PRN

## 2019-11-27 MED ORDER — ACETAMINOPHEN 325 MG PO TABS: 650.0000 mg | ORAL_TABLET | Freq: Four times a day (QID) | ORAL | Status: DC | PRN

## 2019-11-27 MED ORDER — IOHEXOL 300 MG/ML  SOLN
80.0000 mL | Freq: Once | INTRAMUSCULAR | Status: AC | PRN
  Administered 2019-11-27: 80 mL via INTRAVENOUS

## 2019-11-27 MED ORDER — PANTOPRAZOLE SODIUM 40 MG IV SOLR: 40.0000 mg | Freq: Two times a day (BID) | INTRAVENOUS | Status: DC

## 2019-11-27 MED ORDER — POLYETHYLENE GLYCOL 3350 17 G PO PACK: 17.0000 g | PACK | Freq: Every day | ORAL | Status: DC | PRN

## 2019-11-27 MED ORDER — SODIUM CHLORIDE 0.9 % IV SOLN
8.0000 mg/h | INTRAVENOUS | Status: DC
  Administered 2019-11-27 – 2019-11-28 (×2): 8 mg/h via INTRAVENOUS
  Filled 2019-11-27 (×4): qty 80

## 2019-11-27 MED ORDER — SODIUM CHLORIDE 0.9 % IV SOLN
80.0000 mg | Freq: Once | INTRAVENOUS | Status: AC
  Administered 2019-11-27: 80 mg via INTRAVENOUS
  Filled 2019-11-27: qty 80

## 2019-11-27 NOTE — Progress Notes (Signed)
Pt was able to drink first contrast solution without difficulty. In attempting to drink the second contrast solution, client verbalized nausea and stated he Ikey try his best to drink the second dose.

## 2019-11-27 NOTE — ED Provider Notes (Signed)
[place]    CSN: [id number] Arrival date & time: 11/27/19  [id number]      History   Chief Complaint Chief Complaint  Patient presents with  . Blood In Stools    HPI Craig Moore is a 78 y.o. male with a history of coronary artery disease-stable, chronic anticoagulation therapy comes to urgent care with black tarry stools which started yesterday.  Patient had 1 black tarry stool and subsequently had bright red bowel movement.  Patient denies any abdominal pain.  He has had 2 bright red bowel movements today.  He admits to having some increased weakness in his legs.  He denies any dizziness, near syncope or syncopal episode.  He admits to having nausea but no vomiting.  He admits to having loss of appetite and some weight loss over the past few weeks.  No history of GI bleed or peptic ulcer disease.   HPI  Past Medical History:  Diagnosis Date  . CAD (coronary artery disease)    Non-STEMI May, 2006, bare-metal stent  //   nuclear, January, 2012, EF 70%, no ischemia  . CHF (congestive heart failure) ([place])   . CKD (chronic kidney disease), stage III   . DVT (deep venous thrombosis) ([place])    December, 2011, Coumadin therapy  . Dyspnea   . Ejection fraction    EF 55%, echo, October, 2006, technically difficult  //  EF 70%, nuclear, January, 2012  . Groin pain    June, 2013  . Hyperlipidemia   . Hypertension   . Myocardial infarction ([place])   . Pre-syncope    ER, January, 2011, dehydration  . Tobacco abuse     Patient Active Problem List   Diagnosis Date Noted  . CHF (congestive heart failure) ([place]) 08/17/2018  . CHF exacerbation ([place]) 08/17/2018  . COPD exacerbation ([place])   . Hyperkalemia 07/15/2018  . 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 Anemia 04/20/2016  . 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 Elevated serum creatinine 04/10/2016  . Erectile dysfunction 08/19/2015  . Hyponatremia 08/20/2014  . Umbilical hernia [id number]  . Osteoarthritis of both hips 06/01/2012   . CAD (coronary artery disease)   . DVT (deep venous thrombosis) ([employer])   . Dyslipidemia 02/23/2010  . Tobacco use disorder 02/23/2010  . Hypertension, well controlled 02/21/2010    Past Surgical History:  Procedure Laterality Date  . CORONARY ANGIOPLASTY  2006  . EYE SURGERY         Home Medications    Prior to Admission medications   Medication Sig Start Date End Date Taking? Authorizing Provider  albuterol (VENTOLIN HFA) 108 (90 Base) MCG/ACT inhaler Inhale 2 puffs into the lungs every 6 (six) hours as needed for wheezing or shortness of breath. 05/31/19   Barre Bing, DO  aspirin EC 81 MG tablet Take 81 mg by mouth daily. 03/23/14   Mariel Aloe, MD  atorvastatin (LIPITOR) 20 MG tablet Take 1 tablet (20 mg total) by mouth daily at 6 PM. 11/09/19   Dorothy Spark, MD  b complex vitamins capsule Take 1 capsule by mouth daily. 10/04/15   Brunetta Genera, MD  folic acid (FOLVITE) 1 MG tablet Take 1 mg by mouth daily.    [provider]  furosemide (LASIX) 20 MG tablet TAKE 1 TAB DAILY AS NEEDED FOR DOE, SOB, EDEMA, OR WEIGHT GAIN >3 POUNDS OVERNIGHT OR 5 POUNDS/WK 10/26/19   Dorothy Spark, MD  hydrALAZINE (APRESOLINE) 25 MG tablet Take 1 tablet (25  mg total) by mouth 3 (three) times daily. 10/26/19   Dorothy Spark, MD  rivaroxaban (XARELTO) 20 MG TABS tablet TAKE 1 TABLET (20 MG TOTAL) BY MOUTH DAILY WITH SUPPER. 10/26/19   Dorothy Spark, MD  sildenafil (VIAGRA) 100 MG tablet Take 0.5 tablets (50 mg total) by mouth daily as needed for erectile dysfunction. 08/19/15   Mariel Aloe, MD    Family History Family History  Problem Relation Age of Onset  . Cancer Mother   . Heart disease Father   . Diabetes Father   . Stroke Father   . Breast cancer Sister     Social History Social History   Tobacco Use  . Smoking status: Current Every Day Smoker    Packs/day: 0.25    Years: 55.00    Pack years: 13.75    Types: Cigarettes  .  Smokeless tobacco: Never Used  Substance Use Topics  . Alcohol use: Yes    Alcohol/week: 8.0 standard drinks    Types: 8 Glasses of wine per week  . Drug use: No     Allergies   Patient has no known allergies.   Review of Systems Review of Systems  Constitutional: Positive for activity change and fatigue. Negative for chills and fever.  HENT: Negative.   Eyes: Negative.   Respiratory: Negative for cough, chest tightness and shortness of breath.   Cardiovascular: Negative for chest pain and palpitations.  Gastrointestinal: Positive for blood in stool and nausea. Negative for abdominal pain, anal bleeding, diarrhea and vomiting.  Endocrine: Negative.   Genitourinary: Negative.   Musculoskeletal: Negative.  Negative for back pain and myalgias.  Skin: Negative.  Negative for rash.  Neurological: Negative for dizziness, weakness, light-headedness and headaches.  Hematological: Does not bruise/bleed easily.  Psychiatric/Behavioral: Negative for confusion and decreased concentration.  All other systems reviewed and are negative.    Physical Exam Triage Vital Signs ED Triage Vitals  Enc Vitals Group     BP 11/27/19 0957 (!) 147/63     Pulse Rate 11/27/19 0957 (!) 122     Resp 11/27/19 0957 16     Temp 11/27/19 0957 98.4 F (36.9 C)     Temp Source 11/27/19 0957 Oral     SpO2 11/27/19 0957 100 %     Weight --      Height --      Head Circumference --      Peak Flow --      Pain Score 11/27/19 0958 5     Pain Loc --      Pain Edu? --      Excl. in [place]? --    No data found.  Updated Vital Signs BP (!) 147/63 (BP Location: Right Arm)   Pulse (!) 122   Temp 98.4 F (36.9 C) (Oral)   Resp 16   SpO2 100%   Visual Acuity Right Eye Distance:   Left Eye Distance:   Bilateral Distance:    Right Eye Near:   Left Eye Near:    Bilateral Near:     Physical Exam Vitals and nursing note reviewed.  Constitutional:      General: He is not in acute distress.     Appearance: He is ill-appearing. He is not toxic-appearing or diaphoretic.  HENT:     Head: Normocephalic and atraumatic.     Mouth/Throat:     Mouth: Mucous membranes are moist.     Pharynx: No oropharyngeal exudate or posterior oropharyngeal erythema.  Eyes:     General:        Right eye: No discharge.     Extraocular Movements: Extraocular movements intact.     Conjunctiva/sclera: Conjunctivae normal.  Cardiovascular:     Rate and Rhythm: Regular rhythm. Tachycardia present.     Pulses: Normal pulses.     Heart sounds: No murmur. No friction rub.  Pulmonary:     Effort: Pulmonary effort is normal. No respiratory distress.     Breath sounds: No rhonchi.  Abdominal:     General: Bowel sounds are normal. There is no distension.     Tenderness: There is no abdominal tenderness. There is no rebound.  Musculoskeletal:        General: No swelling, tenderness or signs of injury. Normal range of motion.     Cervical back: Normal range of motion. No rigidity or tenderness.  Skin:    General: Skin is warm.     Capillary Refill: Capillary refill takes less than 2 seconds.     Findings: No erythema or rash.  Neurological:     Mental Status: He is alert and oriented to person, place, and time. Mental status is at baseline.     Cranial Nerves: No cranial nerve deficit.     Motor: No weakness.  Psychiatric:        Mood and Affect: Mood normal.      UC Treatments / Results  Labs (all labs ordered are listed, but only abnormal results are displayed) Labs Reviewed - No data to display  EKG   Radiology No results found.  Procedures Procedures (including critical care time)  Medications Ordered in UC Medications - No data to display  Initial Impression / Assessment and Plan / UC Course  I have reviewed the triage vital signs and the nursing notes.  Pertinent labs & imaging results that were available during my care of the patient were reviewed by me and considered in my  medical decision making (see chart for details).     1.  Acute GI bleed on anticoagulation: Patient has GI bleed and currently on Xarelto. Patient has some weakness and has tachycardia of 122. Given the patient's clinical condition he Abdalla require emergency department evaluation.  Patient agrees to being transferred to the ED.  Patient was transported to the ED in a wheelchair and was accompanied by clinical team member. Final Clinical Impressions(s) / UC Diagnoses   Final diagnoses:  None   Discharge Instructions   None    ED Prescriptions    None     PDMP not reviewed this encounter.   Chase Picket, MD 11/27/19 1047

## 2019-11-27 NOTE — H&P (Addendum)
[facility] Admission History and Physical Service Pager: [phone]  Patient name: Craig Moore Medical record number: [id number] Date of birth: 06-25-1941 Age: 78 y.o. Gender: male  Primary Care Provider: Caroline More, DO Consultants: GI Code Status: Full, verified with patient on admission Preferred Emergency Contact: Kurt Houghland, Brother, [phone], or Daughter, April, [phone]  Chief Complaint: BRBPR  Assessment and Plan: Craig Moore is a 78 y.o. male presenting with bright red blood per rectum and melena for 1 day. PMH is significant for gallbladder mass, DVT, CAD, COPD, HFpEF, Hyperkalemia, CKD, HTN, and HLD.  Melena and BRBPR  H/o Anemia Presented to urgent care this morning and was subsequently sent to the ED following 1 day of BRBPR and melena that has had a constant duration for 24 hours. Has a questionable history of melena years ago that resolved on its own. Only other associated symptoms is dyspnea on exertion, denies chest pain, fatigue, dizziness, no murmur on exam. He has regular bowel movements with occasional constipation that resolves on its own. Patient has never has a colonoscopy. Etiology unknown at this time but likely due to malignancy given his history of a gallbladder mass, esophageal varices given his daily alcohol consumption (2 glasses of wine/day) although LFTs wnl, there is also a likelihood of GI bleed due to anticoagulant use for history of DVT. Patient is stable on admission. Vitals wnl. EKG NSR. 100% on RA. Hgb 10.2> 9.9, Hgb was 12.7 on 12/4. MCV 107.3 glu 125 PT 20.8, INR 1.8. He was started on IV protonix in the ED. GI consulted and recommends EGD tomorrow at 11AM, or to call sooner if signs of active bleeding.  VSS at this time, HR currently in 90s, although was 122 on admission, BP stable.  Upper GI bleed suspected given melena, BUN elevated to 43 from 6 on 12/4.  If EGD unrevealing, consider colonoscopy. 2017 Abd  pelvis CT "focal wall thickening along the fundus of the gallbladder. Focal adenomyosis could certainly have this appearance but can be difficult to differentiate from gallbladder malignancy." CT Abdomen pelvis repeat for today pending. Of note, he would like to avoid transfusion but reports that he is okay with blood transfusion if it would save his life. Type and screen obtained for anticipation of transfusion. Shedric admit to telemetry and appreciate GI recommendations following upper GI studies. -Admit to telemetry, obs FPTS Attending Dr. Dorcas Mcmurray -GI consulted, EGD tomorrow. Kue appreciated recommendations -Continue IV protonix 80mg , 2 -F/u on CT Abd, plevis - AM BMP, CBC, Vit B12, Folate (given elevated MCV) - transfusion threshold  -Hold xarelto and ASA, start SCD for dvt ppx - given concern for active bleed, Dru also hold home hydral as to avoid hypotension - Transfusion threshold 8, given cardiac disease  -Start miralax prn -Continue cardiac monitoring, pulse ox monitoring -routine vitals - Clear liquid diet today, NPO @ MN - Calixto need to weight risks/benefits of anticoagulation moving forward as patient has a history of multiple DVTs - Given alcohol use, [place] monitor on CIWA without ativan ordered  Gallbladder disease/mass Seen by oncology in 2017 abd U/S 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 Repeat U/S in 2018 w/ stable mass w/ calcifications in anterior gallbladder wall consistent with adenomyomatosis and less likely malignancy. Suggestive of needing surgical consult in the future. No RUQ discomfort at this time.  Given bleeding, Kmari order while patient is in hospital for follow up, as malignancy could be causing patient's GI bleed.  Cr is 1.5, patient with appropriate  EF, therefore could give fluids if concern of contrast induced nephropathy in the future.  Elex opt for imaging today. -CT Abdomen and pelvis pending as above -continue to monitor for symptoms  H/o  DVT Was taken off of coumadin 1 year s/p cardiac stent placement when DVT occurred in 2007 and 2014. Started on Xarelto. Home dose is Xarelto 20 mg. Hold for now in the setting of GI bleed. -Hold d/t acute GI bleed -Reconsider restarting anticoag following resolution of bleed, Kaysan need risk/benefit discussion  CAD No chest pain at this time. 2 bare metal stents placed 2006 was on coumadin and taken off 2007. DVT occurred then and was placed on xarelto. Home med includes ASA 81. -Hold ASA in the setting of acute bleed.  COPD  Tobacco use disorder Smokes 3 cigarettes/day. Endorses dyspnea on exertion likely due from anemia. Speaking in full sentences and satting well on RA. CXR wnl. Home medication is Albuterol PRN.  -Amarion continue albuterol PRN - encourage smoking cessation, nicotine patch prn  HTN Admission BP 103-157/63-81. Does not endorse headache changes in vision or chest pain at this time. Home medication is Hydralazine 25mg  TID.  -Phuc hold home medication for now, given concern for bleed to avoid hypotension -consider restarting when appropriate  HLD Last lipid panel was 11/10/2019. Tot chol 156, HDL 96, LDL 47, triglycerides 69. Home medication is Lipitor 20mg . -continue home medication  HFpEF Does not appear to be volume up at this time. No LE edema and satting well on RA. Last Echo 08/17/2018 w/ LVEF of 60-65% w/o significant valvular abnormalities. Home medication is Lasix 20 mg prn. -Hold lasix for now  -consider IVF if needed after CT contrast  H/o Hyperkalemia  Mild Hyponatremia  Pseudohypocalcemia K 4.4 on admission. Nour continue to monitor on daily labs and issue lokelma if necessary. Na 133>132 on admission, has been intermittently low on chart review.  Asymptomatic, [place] continue to monitor. Ca corrects to 9.3, albumin 2.7.  Albumin was 4.2 on 12/4, acute decrease likely 2/2 GI bleed.  -am BMP -Give lokelma PRN  CKD III  Patient reports history of hematuria  but no documentation of this. Has dark urine when he does not urinated for a while but otherwise is of normal color. Cr 1.7 BUN 43 GFR 38 on admission. Baseline Cr 1.5-1.70. Martavis continue to monitor on BMP.  Monitor UOP and Cr with administration of Contrast with CT Abd/Pelvis -am BMP -Avoid nephrotoxic drugs.  - UA as outpatient  FEN/GI: Protonix 40mg , Clear Liquid Diet today, NPO @ MN Prophylaxis: SCDs  Disposition: Admit to Telemetry, FPTS, EGD 12/22  History of Present Illness:  Cleophas Gronemeyer is a 78 y.o. male presenting with BRBPR since yesterday. Yesterday started to notice that he was having black, tarry stools.  Then later in the day, it was bloody.  Had two more bloody stools overnight and had one bloody stool in ED.  Describes the stool as, "the solid part of waste is black with a little red, and red around it." Patient denies iron supplementation. No abd pain, no bloody sputum.  No hematemesis.  No chest pain.  In the last week, he endorses dyspnea on exertion, no SOB at rest.  Reports that he has had blood in urine in the past, saw kidney doctor, and he states "nothing was done."  If he holds his urine during the day, he sees brown in his urine. In 2017, was told that he might have cancer in his gallbladder, but  never had anything done with this.  Denies any abdominal pain from this in the last 3 years. Last night he did not sleep because he was so worried about the blood in his stool. Reports that he had black stools once several years ago and went to his primary doctor, saw a nurse, and was told that it wasn't blood at that time. Presented to Urgent Care this AM, and given decreased Hgb and bloody stools, was sent to the ED.  Review Of Systems: Per HPI with the following additions:   Review of Systems  Constitutional: Positive for chills (last week and yesterday). Negative for fever and malaise/fatigue.  HENT: Negative for congestion, nosebleeds and sore throat.   Eyes: Negative for  blurred vision.  Respiratory: Positive for shortness of breath (on exertion). Negative for cough and hemoptysis.   Cardiovascular: Negative for chest pain, palpitations and leg swelling.  Gastrointestinal: Positive for blood in stool and melena. Negative for abdominal pain, constipation, diarrhea, nausea and vomiting.  Genitourinary: Positive for hematuria. Negative for dysuria.  Neurological: Negative for dizziness.    Patient Active Problem List   Diagnosis Date Noted  . CHF (congestive heart failure) ([place]) 08/17/2018  . CHF exacerbation ([place]) 08/17/2018  . COPD exacerbation ([place])   . Hyperkalemia 07/15/2018  . 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 Anemia 04/20/2016  . 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 Elevated serum creatinine 04/10/2016  . Erectile dysfunction 08/19/2015  . Hyponatremia 08/20/2014  . Umbilical hernia [id number]  . Osteoarthritis of both hips 06/01/2012  . CAD (coronary artery disease)   . DVT (deep venous thrombosis) ([employer])   . Dyslipidemia 02/23/2010  . Tobacco use disorder 02/23/2010  . Hypertension, well controlled 02/21/2010    Past Medical History: Past Medical History:  Diagnosis Date  . CAD (coronary artery disease)    Non-STEMI May, 2006, bare-metal stent  //   nuclear, January, 2012, EF 70%, no ischemia  . CHF (congestive heart failure) ([place])   . CKD (chronic kidney disease), stage III   . DVT (deep venous thrombosis) ([employer])    December, 2011, Coumadin therapy  . Dyspnea   . Ejection fraction    EF 55%, echo, October, 2006, technically difficult  //  EF 70%, nuclear, January, 2012  . Groin pain    June, 2013  . Hyperlipidemia   . Hypertension   . Myocardial infarction ([place])   . Pre-syncope    ER, January, 2011, dehydration  . Tobacco abuse     Past Surgical History: Past Surgical History:  Procedure Laterality Date  . CORONARY ANGIOPLASTY  2006  . EYE SURGERY      Social History: Social History   Tobacco Use  . Smoking status:  Current Every Day Smoker    Packs/day: 0.25    Years: 55.00    Pack years: 13.75    Types: Cigarettes  . Smokeless tobacco: Never Used  Substance Use Topics  . Alcohol use: Yes    Alcohol/week: 8.0 standard drinks    Types: 8 Glasses of wine per week    Comment: 2-3 glasses of wine a day  . Drug use: No   Additional social history: Drinks 2 glasses of wine a day, smokes about 1 pack of cigarettes a week.  Lives alone, drives. Please also refer to relevant sections of EMR.  Family History: Family History  Problem Relation Age of Onset  . Cancer Mother   . Heart disease Father   . Diabetes Father   . Stroke  Father   . Breast cancer Sister    No family history of colon cancer or disease  Allergies and Medications: No Known Allergies No current facility-administered medications on file prior to encounter.   Current Outpatient Medications on File Prior to Encounter  Medication Sig Dispense Refill  . albuterol (VENTOLIN HFA) 108 (90 Base) MCG/ACT inhaler Inhale 2 puffs into the lungs every 6 (six) hours as needed for wheezing or shortness of breath. 18 g 0  . aspirin EC 81 MG tablet Take 81 mg by mouth daily.    Marland Kitchen atorvastatin (LIPITOR) 20 MG tablet Take 1 tablet (20 mg total) by mouth daily at 6 PM. 90 tablet 3  . b complex vitamins capsule Take 1 capsule by mouth daily.    . folic acid (FOLVITE) 1 MG tablet Take 1 mg by mouth daily.    . furosemide (LASIX) 20 MG tablet TAKE 1 TAB DAILY AS NEEDED FOR DOE, SOB, EDEMA, OR WEIGHT GAIN >3 POUNDS OVERNIGHT OR 5 POUNDS/WK 90 tablet 3  . hydrALAZINE (APRESOLINE) 25 MG tablet Take 1 tablet (25 mg total) by mouth 3 (three) times daily. 270 tablet 2  . rivaroxaban (XARELTO) 20 MG TABS tablet TAKE 1 TABLET (20 MG TOTAL) BY MOUTH DAILY WITH SUPPER. 90 tablet 3  . sildenafil (VIAGRA) 100 MG tablet Take 0.5 tablets (50 mg total) by mouth daily as needed for erectile dysfunction. 10 tablet 11    Objective: BP 103/67   Pulse 98   Temp 98.7  F (37.1 C) (Oral)   Resp 20   Ht 5\' 10"  (1.778 m)   Wt 70.3 kg   SpO2 100%   BMI 22.24 kg/m  Exam:  General: Thin male.  Appears well, no acute distress. Age appropriate. Lying in the bed.  Cardiac: RRR, normal heart sounds, no murmurs Respiratory: CTAB, normal effort Abdomen: soft, nontender, nondistended, +BS Extremities: No edema or cyanosis. Skin: Warm and dry, bilaterally leg scars Neuro: alert and oriented, no focal deficits Psych: normal affect  Labs and Imaging: CBC BMET  Recent Labs  Lab 11/27/19 1105 11/27/19 1118  WBC 10.2  --   HGB 9.9* 10.2*  HCT 28.1* 30.0*  PLT 342  --    Recent Labs  Lab 11/27/19 1105 11/27/19 1118  NA 133* 132*  K 4.4 4.5  CL 103 103  CO2 21*  --   BUN 43* 40*  CREATININE 1.70* 1.50*  GLUCOSE 125* 120*  CALCIUM 8.3*  --      CT ABD, pelvis w/ contrast: Pending  EXAM: CHEST  1 VIEW COMPARISON:  August 17, 2018 IMPRESSION: No edema or consolidation. Stable cardiac silhouette. Aortic Atherosclerosis (ICD10-I70.0).  Gerlene Fee, DO 11/27/2019, 3:11 PM PGY-1, [place] Intern pager: [phone], text pages welcome  FPTS Upper-Level Resident Addendum   I have independently interviewed and examined the patient. I have discussed the above with the original author and agree with their documentation. My edits for correction/addition/clarification are in green. Please see also any attending notes.   [place] Constable, D.O. PGY-2, [facility] 11/27/2019 6:21 PM  [place] Service pager: [phone] (text pages welcome through [place])

## 2019-11-27 NOTE — Consult Note (Signed)
Reason for Consult: GI bleed Referring Physician: Hospital team  Craig Moore is an 78 y.o. male.  HPI: Patient seen and examined and case discussed with the ER physician in his hospital computer chart reviewed and he is on both blood thinner and aspirin but denies any extra nonsteroidals and has not had much GI issues over the years and has not had an endoscopy or colonoscopy and we discussed his CT from 2017 and lately he has had some nausea and decreased appetite but no significant weight loss and no pain and yesterday had some black bowel movements and today it was more bright red and is admitted for further work-up and plans  Past Medical History:  Diagnosis Date  . CAD (coronary artery disease)    Non-STEMI May, 2006, bare-metal stent  //   nuclear, January, 2012, EF 70%, no ischemia  . CHF (congestive heart failure) ([place])   . CKD (chronic kidney disease), stage III   . DVT (deep venous thrombosis) ([place])    December, 2011, Coumadin therapy  . Dyspnea   . Ejection fraction    EF 55%, echo, October, 2006, technically difficult  //  EF 70%, nuclear, January, 2012  . Groin pain    June, 2013  . Hyperlipidemia   . Hypertension   . Myocardial infarction ([place])   . Pre-syncope    ER, January, 2011, dehydration  . Tobacco abuse     Past Surgical History:  Procedure Laterality Date  . CORONARY ANGIOPLASTY  2006  . EYE SURGERY      Family History  Problem Relation Age of Onset  . Cancer Mother   . Heart disease Father   . Diabetes Father   . Stroke Father   . Breast cancer Sister     Social History:  reports that he has been smoking cigarettes. He has a 13.75 pack-year smoking history. He has never used smokeless tobacco. He reports current alcohol use of about 8.0 standard drinks of alcohol per week. He reports that he does not use drugs.  Allergies: No Known Allergies  Medications: I have reviewed the patient's current medications.  Results for orders placed or performed  during the hospital encounter of 11/27/19 (from the past 48 hour(s))  Comprehensive metabolic panel     Status: Abnormal   Collection Time: 11/27/19 11:05 AM  Result Value Ref Range   Sodium 133 (L) 135 - 145 mmol/L   Potassium 4.4 3.5 - 5.1 mmol/L   Chloride 103 98 - 111 mmol/L   CO2 21 (L) 22 - 32 mmol/L   Glucose, Bld 125 (H) 70 - 99 mg/dL   BUN 43 (H) 8 - 23 mg/dL   Creatinine, Ser 1.70 (H) 0.61 - 1.24 mg/dL   Calcium 8.3 (L) 8.9 - 10.3 mg/dL   Total Protein 6.8 6.5 - 8.1 g/dL   Albumin 2.7 (L) 3.5 - 5.0 g/dL   AST 19 15 - 41 U/L   ALT 23 0 - 44 U/L   Alkaline Phosphatase 148 (H) 38 - 126 U/L   Total Bilirubin 0.6 0.3 - 1.2 mg/dL   GFR calc non Af Amer 38 (L) >60 mL/min   GFR calc Af Amer 44 (L) >60 mL/min   Anion gap 9 5 - 15    Comment: Performed at [facility], [address], [place], [place] [address]  CBC WITH DIFFERENTIAL     Status: Abnormal   Collection Time: 11/27/19 11:05 AM  Result Value Ref Range  WBC 10.2 4.0 - 10.5 K/uL   RBC 2.62 (L) 4.22 - 5.81 MIL/uL   Hemoglobin 9.9 (L) 13.0 - 17.0 g/dL   HCT 28.1 (L) 39.0 - 52.0 %   MCV 107.3 (H) 80.0 - 100.0 fL   MCH 37.8 (H) 26.0 - 34.0 pg   MCHC 35.2 30.0 - 36.0 g/dL   RDW 12.6 11.5 - 15.5 %   Platelets 342 150 - 400 K/uL   nRBC 0.0 0.0 - 0.2 %   Neutrophils Relative % 85 %   Neutro Abs 8.7 (H) 1.7 - 7.7 K/uL   Lymphocytes Relative 6 %   Lymphs Abs 0.6 (L) 0.7 - 4.0 K/uL   Monocytes Relative 4 %   Monocytes Absolute 0.4 0.1 - 1.0 K/uL   Eosinophils Relative 3 %   Eosinophils Absolute 0.3 0.0 - 0.5 K/uL   Basophils Relative 0 %   Basophils Absolute 0.0 0.0 - 0.1 K/uL   Immature Granulocytes 2 %   Abs Immature Granulocytes 0.18 (H) 0.00 - 0.07 K/uL    Comment: Performed at [facility], [address], [place], [place] [address]  Protime-INR     Status: Abnormal   Collection Time: 11/27/19 11:05 AM  Result Value Ref Range   Prothrombin Time 20.8 (H) 11.4 - 15.2 seconds   INR 1.8 (H) 0.8 - 1.2     Comment: (NOTE) INR goal varies based on device and disease states. Performed at [facility], [place] [address], [place], [place] [address]   Type and screen [place]     Status: None   Collection Time: 11/27/19 11:07 AM  Result Value Ref Range   ABO/RH(D) A NEG    Antibody Screen NEG    Sample Expiration      11/30/2019,2359 Performed at [facility], [place] [address] [place], [place] [address]   I-Stat Chem 8, ED     Status: Abnormal   Collection Time: 11/27/19 11:18 AM  Result Value Ref Range   Sodium 132 (L) 135 - 145 mmol/L   Potassium 4.5 3.5 - 5.1 mmol/L   Chloride 103 98 - 111 mmol/L   BUN 40 (H) 8 - 23 mg/dL   Creatinine, Ser 1.50 (H) 0.61 - 1.24 mg/dL   Glucose, Bld 120 (H) 70 - 99 mg/dL   Calcium, Ion 1.04 (L) 1.15 - 1.40 mmol/L   TCO2 20 (L) 22 - 32 mmol/L   Hemoglobin 10.2 (L) 13.0 - 17.0 g/dL   HCT 30.0 (L) 39.0 - 52.0 %    DG Chest 1 View  Result Date: 11/27/2019 CLINICAL DATA:  Shortness of breath EXAM: CHEST  1 VIEW COMPARISON:  August 17, 2018 FINDINGS: There is no edema or consolidation. The heart size and pulmonary vascularity are within normal limits. No adenopathy. There is aortic atherosclerosis. There is degenerative change in each shoulder. IMPRESSION: No edema or consolidation. Stable cardiac silhouette. Aortic Atherosclerosis (ICD10-I70.0). Electronically Signed   By: Lowella Grip III M.D.   On: 11/27/2019 11:26    Review of Systems negative except above not mentioned above his family history is negative for any GI problems as well Blood pressure 126/72, pulse 90, temperature 98.7 F (37.1 C), temperature source Oral, resp. rate (!) 21, height 5\' 10"  (1.778 m), weight 70.3 kg, SpO2 99 %. Physical Exam vital signs stable afebrile no acute distress lungs are clear regular rate and rhythm abdomen is soft nontender BUN and creatinine above baseline hemoglobin decreased to  10.2  Assessment/Plan: GI blood loss and  patient on aspirin and blood thinner Plan: The risks benefits methods of endoscopy was discussed with the patient and have tentatively plan to proceed tomorrow at 11 AM but call us sooner if signs of active bleeding with further work-up and plans pending those findings and he Rendell be thinking about a colonoscopy next if this is nonrevealing and he might need a repeat CT at some point just to make sure no changes on his gallbladder  Courteny Egler E 11/27/2019, 12:40 PM

## 2019-11-27 NOTE — ED Triage Notes (Signed)
Pt states he has been having bloody stool. Pt states she is on blood thinner. Pt states he has no appetite. Pt states he has had SOB. X 2 days.

## 2019-11-27 NOTE — ED Notes (Signed)
Patient is being discharged from the Urgent [facility] and sent to the Emergency Department via wheelchair by staff. Per Dr Lanny Cramp, patient is stable but in need of higher level of care due to GI Bleed and taking blood thinners. Patient is aware and verbalizes understanding of plan of care.  Vitals:   11/27/19 0957  BP: (!) 147/63  Pulse: (!) 122  Resp: 16  Temp: 98.4 F (36.9 C)  SpO2: 100%

## 2019-11-27 NOTE — ED Provider Notes (Signed)
[facility] EMERGENCY DEPARTMENT Provider Note   CSN: [id number] Arrival date & time: 11/27/19  1039     History Chief Complaint  Patient presents with  . Rectal Bleeding    Shawne Degenhardt is a 78 y.o. male.  The history is provided by the patient and medical records. No language interpreter was used.  Rectal Bleeding  Noris Rupley is a 78 y.o. male who presents to the Emergency Department complaining of rectal bleeding. He presents the emergency department upon referral from urgent care for evaluation of rectal bleeding that began last night. Around 11 PM he had one black stool. He was unable to sleep the rest the night due to concern over the black stool. He then had three stools that were described as bright red and bloody. He reports feeling fatigued but he thinks that this is from staying up overnight. He has dyspnea on exertion, and this is chronic. He denies any fevers, chest pain, abdominal pain, nausea, vomiting. He takes is a relative for history of bilateral lower extremity DVTs. His last dose was yesterday. He also takes aspirin for history of coronary artery disease. No history of G.I. bleed in the past. He does not have a gastroenterologist. He does not take any NSAIDs. He does drink 2 to 3 glasses of wine daily.  Lives at home alone.      Past Medical History:  Diagnosis Date  . CAD (coronary artery disease)    Non-STEMI May, 2006, bare-metal stent  //   nuclear, January, 2012, EF 70%, no ischemia  . CHF (congestive heart failure) ([place])   . CKD (chronic kidney disease), stage III   . DVT (deep venous thrombosis) ([place])    December, 2011, Coumadin therapy  . Dyspnea   . Ejection fraction    EF 55%, echo, October, 2006, technically difficult  //  EF 70%, nuclear, January, 2012  . Groin pain    June, 2013  . Hyperlipidemia   . Hypertension   . Myocardial infarction ([place])   . Pre-syncope    ER, January, 2011, dehydration  . Tobacco abuse      Patient Active Problem List   Diagnosis Date Noted  . GI bleed 11/27/2019  . CHF (congestive heart failure) ([place]) 08/17/2018  . CHF exacerbation ([place]) 08/17/2018  . COPD exacerbation ([place])   . Hyperkalemia 07/15/2018  . 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 Anemia 04/20/2016  . 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 Elevated serum creatinine 04/10/2016  . Erectile dysfunction 08/19/2015  . Hyponatremia 08/20/2014  . Umbilical hernia [id number]  . Osteoarthritis of both hips 06/01/2012  . CAD (coronary artery disease)   . DVT (deep venous thrombosis) ([place])   . Dyslipidemia 02/23/2010  . Tobacco use disorder 02/23/2010  . Hypertension, well controlled 02/21/2010    Past Surgical History:  Procedure Laterality Date  . CORONARY ANGIOPLASTY  2006  . EYE SURGERY         Family History  Problem Relation Age of Onset  . Cancer Mother   . Heart disease Father   . Diabetes Father   . Stroke Father   . Breast cancer Sister     Social History   Tobacco Use  . Smoking status: Current Every Day Smoker    Packs/day: 0.25    Years: 55.00    Pack years: 13.75    Types: Cigarettes  . Smokeless tobacco: Never Used  Substance Use Topics  . Alcohol use: Yes    Alcohol/week: 8.0 standard  drinks    Types: 8 Glasses of wine per week    Comment: 2-3 glasses of wine a day  . Drug use: No    Home Medications Prior to Admission medications   Medication Sig Start Date End Date Taking? Authorizing Provider  acetaminophen (TYLENOL) 500 MG tablet Take 1,000 mg by mouth every 6 (six) hours as needed for mild pain or headache.   Yes [provider]  albuterol (VENTOLIN HFA) 108 (90 Base) MCG/ACT inhaler Inhale 2 puffs into the lungs every 6 (six) hours as needed for wheezing or shortness of breath. 05/31/19  Yes Benson Bing, DO  aspirin EC 81 MG tablet Take 81 mg by mouth daily. 03/23/14  Yes Mariel Aloe, MD  atorvastatin (LIPITOR) 20 MG tablet Take 1 tablet (20 mg total) by  mouth daily at 6 PM. 11/09/19  Yes Dorothy Spark, MD  b complex vitamins capsule Take 1 capsule by mouth daily. 10/04/15  Yes Brunetta Genera, MD  folic acid (FOLVITE) 1 MG tablet Take 1 mg by mouth daily.   Yes [provider]  furosemide (LASIX) 20 MG tablet TAKE 1 TAB DAILY AS NEEDED FOR DOE, SOB, EDEMA, OR WEIGHT GAIN >3 POUNDS OVERNIGHT OR 5 POUNDS/WK Patient taking differently: Take 20 mg by mouth See admin instructions. TAKE 1 TAB DAILY AS NEEDED FOR DOE, SOB, EDEMA, OR WEIGHT GAIN >3 POUNDS OVERNIGHT OR 5 POUNDS/WK 10/26/19  Yes Dorothy Spark, MD  hydrALAZINE (APRESOLINE) 25 MG tablet Take 1 tablet (25 mg total) by mouth 3 (three) times daily. 10/26/19  Yes Dorothy Spark, MD  rivaroxaban (XARELTO) 20 MG TABS tablet TAKE 1 TABLET (20 MG TOTAL) BY MOUTH DAILY WITH SUPPER. 10/26/19  Yes Dorothy Spark, MD  sildenafil (VIAGRA) 100 MG tablet Take 0.5 tablets (50 mg total) by mouth daily as needed for erectile dysfunction. 08/19/15  Yes Mariel Aloe, MD    Allergies    Patient has no known allergies.  Review of Systems   Review of Systems  Gastrointestinal: Positive for hematochezia.  All other systems reviewed and are negative.   Physical Exam Updated Vital Signs BP (!) 157/81 (BP Location: Right Arm)   Pulse 92   Temp 99.1 F (37.3 C) (Oral)   Resp 18   Ht 5\' 10"  (1.778 m)   Wt 70.3 kg   SpO2 100%   BMI 22.24 kg/m   Physical Exam Vitals and nursing note reviewed.  Constitutional:      Appearance: He is well-developed.  HENT:     Head: Normocephalic and atraumatic.  Cardiovascular:     Rate and Rhythm: Regular rhythm. Tachycardia present.     Heart sounds: No murmur.  Pulmonary:     Effort: Pulmonary effort is normal. No respiratory distress.     Breath sounds: Normal breath sounds.  Abdominal:     Palpations: Abdomen is soft.     Tenderness: There is no abdominal tenderness. There is no guarding or rebound.  Genitourinary:     Comments: Maroon stool in rectal vault Musculoskeletal:        General: No swelling or tenderness.  Skin:    General: Skin is warm and dry.  Neurological:     Mental Status: He is alert and oriented to person, place, and time.  Psychiatric:        Behavior: Behavior normal.     ED Results / Procedures / Treatments   Labs (all labs ordered are listed, but only  abnormal results are displayed) Labs Reviewed  COMPREHENSIVE METABOLIC PANEL - Abnormal; Notable for the following components:      Result Value   Sodium 133 (*)    CO2 21 (*)    Glucose, Bld 125 (*)    BUN 43 (*)    Creatinine, Ser 1.70 (*)    Calcium 8.3 (*)    Albumin 2.7 (*)    Alkaline Phosphatase 148 (*)    GFR calc non Af Amer 38 (*)    GFR calc Af Amer 44 (*)    All other components within normal limits  CBC WITH DIFFERENTIAL/PLATELET - Abnormal; Notable for the following components:   RBC 2.62 (*)    Hemoglobin 9.9 (*)    HCT 28.1 (*)    MCV 107.3 (*)    MCH 37.8 (*)    Neutro Abs 8.7 (*)    Lymphs Abs 0.6 (*)    Abs Immature Granulocytes 0.18 (*)    All other components within normal limits  PROTIME-INR - Abnormal; Notable for the following components:   Prothrombin Time 20.8 (*)    INR 1.8 (*)    All other components within normal limits  I-STAT CHEM 8, ED - Abnormal; Notable for the following components:   Sodium 132 (*)    BUN 40 (*)    Creatinine, Ser 1.50 (*)    Glucose, Bld 120 (*)    Calcium, Ion 1.04 (*)    TCO2 20 (*)    Hemoglobin 10.2 (*)    HCT 30.0 (*)    All other components within normal limits  SARS CORONAVIRUS 2 (TAT 6-24 HRS)  TYPE AND SCREEN  ABO/RH    EKG None  Radiology DG Chest 1 View  Result Date: 11/27/2019 CLINICAL DATA:  Shortness of breath EXAM: CHEST  1 VIEW COMPARISON:  August 17, 2018 FINDINGS: There is no edema or consolidation. The heart size and pulmonary vascularity are within normal limits. No adenopathy. There is aortic atherosclerosis. There is  degenerative change in each shoulder. IMPRESSION: No edema or consolidation. Stable cardiac silhouette. Aortic Atherosclerosis (ICD10-I70.0). Electronically Signed   By: Lowella Grip III M.D.   On: 11/27/2019 11:26    Procedures Procedures (including critical care time) CRITICAL CARE Performed by: Quintella Reichert   Total critical care time: 35 minutes  Critical care time was exclusive of separately billable procedures and treating other patients.  Critical care was necessary to treat or prevent imminent or life-threatening deterioration.  Critical care was time spent personally by me on the following activities: development of treatment plan with patient and/or surrogate as well as nursing, discussions with consultants, evaluation of patient's response to treatment, examination of patient, obtaining history from patient or surrogate, ordering and performing treatments and interventions, ordering and review of laboratory studies, ordering and review of radiographic studies, pulse oximetry and re-evaluation of patient's condition.  Medications Ordered in ED Medications  pantoprazole (PROTONIX) 80 mg in sodium chloride 0.9 % 250 mL (0.32 mg/mL) infusion (8 mg/hr Intravenous Restarted 11/27/19 1314)  pantoprazole (PROTONIX) injection 40 mg (has no administration in time range)  atorvastatin (LIPITOR) tablet 20 mg (has no administration in time range)  albuterol (PROVENTIL) (2.5 MG/3ML) 0.083% nebulizer solution 3 mL (has no administration in time range)  acetaminophen (TYLENOL) tablet 650 mg (has no administration in time range)    Or  acetaminophen (TYLENOL) suppository 650 mg (has no administration in time range)  polyethylene glycol (MIRALAX / GLYCOLAX) packet 17 g (has no administration  in time range)  pantoprazole (PROTONIX) 80 mg in sodium chloride 0.9 % 100 mL IVPB (0 mg Intravenous Stopped 11/27/19 1342)  sodium chloride 0.9 % bolus 1,000 mL (1,000 mLs Intravenous New Bag/Given  11/27/19 1219)    ED Course  I have reviewed the triage vital signs and the nursing notes.  Pertinent labs & imaging results that were available during my care of the patient were reviewed by me and considered in my medical decision making (see chart for details).    MDM Rules/Calculators/A&P                      Patient here for evaluation of bright red blood per rectum. He has maroon blood on rectal evaluation. He is tachycardic but well perfused with normal blood pressures. Patient was treated with IV fluids, Protonix.  D/w Dr. Watt Climes with [facility] see the patient in consult.  Recommends clear liquids, NPO after midnight.   Internal medicine consulted for admission for further treatment.  Final Clinical Impression(s) / ED Diagnoses Final diagnoses:  SOB (shortness of breath)  Acute upper GI bleeding    Rx / DC Orders ED Discharge Orders    None       Quintella Reichert, MD 11/27/19 1559

## 2019-11-27 NOTE — ED Triage Notes (Signed)
Patient states yesterday when using bathroom he had dark stool and today it was bright red. Patient c/o feeling fatigued. On Xarelto bc of blood clots in legs.

## 2019-11-27 NOTE — ED Notes (Signed)
Medications requested from pharmacy.

## 2019-11-27 NOTE — Discharge Instructions (Addendum)
Patient is advised to go to the ED for further evaluation. He agrees to do that.He Randol by accompanied by clinical team member to the ED.

## 2019-11-28 ENCOUNTER — Encounter (HOSPITAL_COMMUNITY): Payer: Self-pay | Admitting: Family Medicine

## 2019-11-28 ENCOUNTER — Observation Stay (HOSPITAL_COMMUNITY): Payer: Medicare Other | Admitting: Certified Registered"

## 2019-11-28 ENCOUNTER — Encounter (HOSPITAL_COMMUNITY)
Admission: EM | Disposition: A | Discharge status: Home / Self Care | Payer: Self-pay | Source: Home / Self Care | Attending: Emergency Medicine

## 2019-11-28 DIAGNOSIS — K264 Chronic or unspecified duodenal ulcer with hemorrhage: Secondary | ICD-10-CM | POA: Diagnosis not present

## 2019-11-28 DIAGNOSIS — B9681 Helicobacter pylori [H. pylori] as the cause of diseases classified elsewhere: Secondary | ICD-10-CM | POA: Diagnosis not present

## 2019-11-28 DIAGNOSIS — R935 Abnormal findings on diagnostic imaging of other abdominal regions, including retroperitoneum: Secondary | ICD-10-CM | POA: Diagnosis not present

## 2019-11-28 DIAGNOSIS — I130 Hypertensive heart and chronic kidney disease with heart failure and stage 1 through stage 4 chronic kidney disease, or unspecified chronic kidney disease: Secondary | ICD-10-CM | POA: Diagnosis not present

## 2019-11-28 DIAGNOSIS — K269 Duodenal ulcer, unspecified as acute or chronic, without hemorrhage or perforation: Secondary | ICD-10-CM | POA: Diagnosis not present

## 2019-11-28 DIAGNOSIS — I509 Heart failure, unspecified: Secondary | ICD-10-CM | POA: Diagnosis not present

## 2019-11-28 DIAGNOSIS — Z20828 Contact with and (suspected) exposure to other viral communicable diseases: Secondary | ICD-10-CM | POA: Diagnosis not present

## 2019-11-28 DIAGNOSIS — K2970 Gastritis, unspecified, without bleeding: Secondary | ICD-10-CM | POA: Diagnosis not present

## 2019-11-28 DIAGNOSIS — K921 Melena: Secondary | ICD-10-CM | POA: Diagnosis not present

## 2019-11-28 DIAGNOSIS — K922 Gastrointestinal hemorrhage, unspecified: Secondary | ICD-10-CM | POA: Diagnosis not present

## 2019-11-28 DIAGNOSIS — N183 Chronic kidney disease, stage 3 unspecified: Secondary | ICD-10-CM | POA: Diagnosis not present

## 2019-11-28 HISTORY — PX: ESOPHAGOGASTRODUODENOSCOPY (EGD) WITH PROPOFOL: SHX5813

## 2019-11-28 HISTORY — PX: BIOPSY: SHX5522

## 2019-11-28 LAB — CBC WITH DIFFERENTIAL/PLATELET
Abs Immature Granulocytes: 0.07 10*3/uL (ref 0.00–0.07)
Basophils Absolute: 0 10*3/uL (ref 0.0–0.1)
Basophils Relative: 0 %
Eosinophils Absolute: 0.6 10*3/uL — ABNORMAL HIGH (ref 0.0–0.5)
Eosinophils Relative: 10 %
HCT: 20.9 % — ABNORMAL LOW (ref 39.0–52.0)
Hemoglobin: 7.4 g/dL — ABNORMAL LOW (ref 13.0–17.0)
Immature Granulocytes: 1 %
Lymphocytes Relative: 10 %
Lymphs Abs: 0.6 10*3/uL — ABNORMAL LOW (ref 0.7–4.0)
MCH: 38.5 pg — ABNORMAL HIGH (ref 26.0–34.0)
MCHC: 35.4 g/dL (ref 30.0–36.0)
MCV: 108.9 fL — ABNORMAL HIGH (ref 80.0–100.0)
Monocytes Absolute: 0.5 10*3/uL (ref 0.1–1.0)
Monocytes Relative: 8 %
Neutro Abs: 4.3 10*3/uL (ref 1.7–7.7)
Neutrophils Relative %: 71 %
Platelets: 261 10*3/uL (ref 150–400)
RBC: 1.92 MIL/uL — ABNORMAL LOW (ref 4.22–5.81)
RDW: 12.8 % (ref 11.5–15.5)
WBC: 6.2 10*3/uL (ref 4.0–10.5)
nRBC: 0 % (ref 0.0–0.2)

## 2019-11-28 LAB — BASIC METABOLIC PANEL
Anion gap: 8 (ref 5–15)
BUN: 28 mg/dL — ABNORMAL HIGH (ref 8–23)
CO2: 22 mmol/L (ref 22–32)
Calcium: 8 mg/dL — ABNORMAL LOW (ref 8.9–10.3)
Chloride: 104 mmol/L (ref 98–111)
Creatinine, Ser: 1.46 mg/dL — ABNORMAL HIGH (ref 0.61–1.24)
GFR calc Af Amer: 53 mL/min — ABNORMAL LOW (ref >60)
GFR calc non Af Amer: 46 mL/min — ABNORMAL LOW (ref >60)
Glucose, Bld: 71 mg/dL (ref 70–99)
Potassium: 4.6 mmol/L (ref 3.5–5.1)
Sodium: 134 mmol/L — ABNORMAL LOW (ref 135–145)

## 2019-11-28 LAB — HEMOGLOBIN AND HEMATOCRIT, BLOOD
HCT: 23.7 % — ABNORMAL LOW (ref 39.0–52.0)
Hemoglobin: 8.8 g/dL — ABNORMAL LOW (ref 13.0–17.0)

## 2019-11-28 LAB — PREPARE RBC (CROSSMATCH)

## 2019-11-28 LAB — VITAMIN B12: Vitamin B-12: 1314 pg/mL — ABNORMAL HIGH (ref 180–914)

## 2019-11-28 SURGERY — ESOPHAGOGASTRODUODENOSCOPY (EGD) WITH PROPOFOL
Anesthesia: Monitor Anesthesia Care

## 2019-11-28 MED ORDER — LACTATED RINGERS IV SOLN: INTRAVENOUS | Status: DC

## 2019-11-28 MED ORDER — SODIUM CHLORIDE 0.9% IV SOLUTION: Freq: Once | INTRAVENOUS | Status: AC

## 2019-11-28 MED ORDER — PANTOPRAZOLE SODIUM 40 MG PO TBEC
40.0000 mg | DELAYED_RELEASE_TABLET | Freq: Two times a day (BID) | ORAL | Status: DC
  Administered 2019-11-28 – 2019-11-29 (×2): 40 mg via ORAL
  Filled 2019-11-28 (×2): qty 1

## 2019-11-28 MED ORDER — PROPOFOL 500 MG/50ML IV EMUL
INTRAVENOUS | Status: DC | PRN
  Administered 2019-11-28: 100 ug/kg/min via INTRAVENOUS

## 2019-11-28 MED ORDER — SODIUM CHLORIDE 0.9 % IV SOLN: INTRAVENOUS | Status: DC

## 2019-11-28 SURGICAL SUPPLY — 15 items

## 2019-11-28 NOTE — Op Note (Signed)
[facility] Patient Name: Craig Moore Procedure Date : 11/28/2019 MRN: [id number] Attending MD: Clarene Essex , MD Date of Birth: 10-25-1941 CSN: [id number] Age: 78 Admit Type: Inpatient Procedure:                Upper GI endoscopy Indications:              [facility]ena inpatient on both aspirin and blood thinner Providers:                Clarene Essex, MD, Glori Bickers, RN, Lina Sar,                            Technician, Manuela Schwartz, CRNA Referring MD:              Medicines:                Propofol total dose 91 mg IV Complications:            No immediate complications. Estimated Blood Loss:     Estimated blood loss: none. Procedure:                Pre-Anesthesia Assessment:                           - Prior to the procedure, a History and Physical                            was performed, and patient medications and                            allergies were reviewed. The patient's tolerance of                            previous anesthesia was also reviewed. The risks                            and benefits of the procedure and the sedation                            options and risks were discussed with the patient.                            All questions were answered, and informed consent                            was obtained. Prior Anticoagulants: The patient has                            taken Xarelto (rivaroxaban), last dose was 2 days                            prior to procedure. ASA Grade Assessment: III - A                            patient with severe systemic disease. After  reviewing the risks and benefits, the patient was                            deemed in satisfactory condition to undergo the                            procedure.                           After obtaining informed consent, the endoscope was                            passed under direct vision. Throughout the                            procedure, the  patient's blood pressure, pulse, and                            oxygen saturations were monitored continuously. The                            [id number]) Olympus gastroscope was                            introduced through the mouth, and advanced to the                            third part of duodenum. The upper GI endoscopy was                            accomplished without difficulty. The patient                            tolerated the procedure well. Scope In: Scope Out: Findings:      A small hiatal hernia was present.      The entire examined stomach was normal. Biopsies were taken with a cold       forceps of antrum and fundus for histology to rule out H. pylori.      One non-bleeding cratered and superficial duodenal ulcer with no       stigmata of bleeding was found in the duodenal bulb as well as some       erythema and edema.      The second portion of the duodenum and third portion of the duodenum       were normal.      The exam was otherwise without abnormality. Impression:               - Small hiatal hernia.                           - Normal stomach. Biopsied.                           - Non-bleeding duodenal ulcer with no stigmata of  bleeding.                           - Normal second portion of the duodenum and third                            portion of the duodenum.                           - The examination was otherwise normal. Recommendation:           - Soft diet today. May discharge on Protonix twice                            a day for 2 weeks then decrease to once a day and                            if he does not need aspirin going forward he Darin                            only need Protonix for 3 months                           - Resume Xarelto (rivaroxaban) at prior dose in 2                            days. Reevaluate whether he truly needs additional                            aspirin with that but would delay  starting that if                            he truly needs it                           - Await pathology results.                           - Return to GI clinic in 4 weeks. To recheck CBC                            and guaiacs and make sure colonoscopy is not needed                           - Telephone GI clinic for pathology results in 1                            week.                           - Telephone GI clinic if symptomatic PRN. [place] need                            elective pancreatic MRI as an outpatient as  well Procedure Code(s):        --- Professional ---                           [phone], Esophagogastroduodenoscopy, flexible,                            transoral; with biopsy, single or multiple Diagnosis Code(s):        --- Professional ---                           K44.9, Diaphragmatic hernia without obstruction or                            gangrene                           K26.9, Duodenal ulcer, unspecified as acute or                            chronic, without hemorrhage or perforation                           K92.1, Melena (includes Hematochezia) CPT copyright 2019 [employer]. All rights reserved. The codes documented in this report are preliminary and upon coder review may  be revised to meet current compliance requirements. Clarene Essex, MD 11/28/2019 11:29:44 AM This report has been signed electronically. Number of Addenda: 0

## 2019-11-28 NOTE — Anesthesia Postprocedure Evaluation (Signed)
Anesthesia Post Note  Patient: Craig Moore  Procedure(s) Performed: ESOPHAGOGASTRODUODENOSCOPY (EGD) WITH PROPOFOL (N/A ) BIOPSY     Patient location during evaluation: Endoscopy Anesthesia Type: MAC Level of consciousness: awake and alert Pain management: pain level controlled Vital Signs Assessment: post-procedure vital signs reviewed and stable Respiratory status: spontaneous breathing, nonlabored ventilation and respiratory function stable Cardiovascular status: blood pressure returned to baseline and stable Postop Assessment: no apparent nausea or vomiting Anesthetic complications: no    Last Vitals:  Vitals:   11/28/19 1155 11/28/19 1226  BP: (!) 137/59 139/61  Pulse: 75 79  Resp: 12 16  Temp:  37.2 C  SpO2: 99% 99%    Last Pain:  Vitals:   11/28/19 1226  TempSrc: Oral  PainSc:                  Lidia Collum

## 2019-11-28 NOTE — Transfer of Care (Signed)
Immediate Anesthesia Transfer of Care Note  Patient: Craig Moore  Procedure(s) Performed: ESOPHAGOGASTRODUODENOSCOPY (EGD) WITH PROPOFOL (N/A ) BIOPSY  Patient Location: Endoscopy Unit  Anesthesia Type:MAC  Level of Consciousness: awake and alert   Airway & Oxygen Therapy: Patient Spontanous Breathing and Patient connected to nasal cannula oxygen  Post-op Assessment: Report given to RN and Post -op Vital signs reviewed and stable  Post vital signs: Reviewed and stable  Last Vitals:  Vitals Value Taken Time  BP 99/50 11/28/19 1125  Temp 36.7 C 11/28/19 1125  Pulse 82 11/28/19 1126  Resp 21 11/28/19 1126  SpO2 99 % 11/28/19 1126  Vitals shown include unvalidated device data.  Last Pain:  Vitals:   11/28/19 1125  TempSrc: Oral  PainSc: 0-No pain         Complications: No apparent anesthesia complications

## 2019-11-28 NOTE — Discharge Summary (Signed)
[facility] Discharge Summary  Patient name: Craig Moore record number: [id number] Date of birth: January 18, 1941 Age: 78 y.o. Gender: male Date of Admission: 11/27/2019  Date of Discharge: 11/29/2019 Admitting Physician: Dickie La, MD  Primary Care Provider: Caroline More, DO Consultants: Gastroenterology   Indication for Hospitalization: symptomatic anemia in setting of suspected GI bleed   Discharge Diagnoses/Problem List:  Active Problems:   Gallbladder mass   GI bleed   Abnormal CT of the abdomen  Disposition: Home  Discharge Condition: stable, improved   Brief Hospital Course:  Symptomatic Anemia in setting of GI Bleed  Craig Moore is a 78 y.o. male with PMH of DVT on Xarelto, HLD, tobacco use, HTN, CAD with bare metal stent, hx of gallbladder mass on CT in 2017, HFpEF presenting with symptomatic anemia in setting of acute GI bleed. During this admission, GI was consulted and performed an EGD who revealed a non-bleeding duodenal ulcer and gave recommendations as detailed below for discharge. Patient was transfused 1 unit of pRBCs while admitted for hemoglobin decrease from 10 to 7.4 with transfusion threshold of 8 given his hx of CAD. His ASA was held per GI recs. At time of discharge, patient was much improved, with stable hemoglobin of 8.4. He was stable for discharge and close follow up was arranged. Strict return precautions were discussed. He voiced understanding and agreement with plan.   HTN  Patient's home blood pressure was held throughout admission. His blood pressure remained normotensive. Opted to hold blood pressure medicine at discharge with plan to recheck at follow up visit to monitor. Consider restarted if elevated.     Issues for Follow Up:  1. Repeat CBC to monitor hemoglobin. 2. ASA held for 2-3 weeks per GI recs. Marquelle needed continued reevaluation if continued ASA therapy is indicated at this time.   3. Patient should  be on Protonix twice daily for 2 weeks, followed by once daily. If ASA discontinued, he Tige only need Protonix for 3 months.  4. Resume Xarelto (rivaroxaban) on 12/24 5. Return to GI clinic in 4 weeks for CBC recheck and guaiac for assessment of need for colonoscopy. 6. Please schedule non-emergent MRI with and without contrast to further evaluate pancreatic mass noted on CT.  7. Please monitor blood pressure. Restart home Hydralazine if elevated at follow up visit.  Significant Procedures:  EGD:  -small hiatal hernia  -1 non bleeding cratered/superficial duodenal ulcer   Significant Labs and Imaging:  Recent Labs  Lab 11/27/19 1105 11/28/19 0603 11/28/19 1327 11/29/19 0804  WBC 10.2 6.2  --  6.4  HGB 9.9* 7.4* 8.8* 8.4*  HCT 28.1* 20.9* 23.7* 23.7*  PLT 342 261  --  244   Recent Labs  Lab 11/27/19 1105 11/27/19 1118 11/28/19 0603 11/29/19 0804  NA 133* 132* 134* 135  K 4.4 4.5 4.6 3.9  CL 103 103 104 107  CO2 21*  --  22 22  GLUCOSE 125* 120* 71 126*  BUN 43* 40* 28* 14  CREATININE 1.70* 1.50* 1.46* 1.35*  CALCIUM 8.3*  --  8.0* 7.8*  ALKPHOS 148*  --   --   --   AST 19  --   --   --   ALT 23  --   --   --   ALBUMIN 2.7*  --   --   --    COVID neg Vit B12: 1314   Results/Tests Pending at Time of Discharge: Folate  Discharge Medications:  Allergies as of 11/29/2019   No Known Allergies     Medication List    STOP taking these medications   aspirin EC 81 MG tablet   hydrALAZINE 25 MG tablet Commonly known as: APRESOLINE     TAKE these medications   acetaminophen 500 MG tablet Commonly known as: TYLENOL Take 1,000 mg by mouth every 6 (six) hours as needed for mild pain or headache.   albuterol 108 (90 Base) MCG/ACT inhaler Commonly known as: VENTOLIN HFA Inhale 2 puffs into the lungs every 6 (six) hours as needed for wheezing or shortness of breath.   atorvastatin 20 MG tablet Commonly known as: LIPITOR Take 1 tablet (20 mg total) by mouth daily  at 6 PM.   b complex vitamins capsule Take 1 capsule by mouth daily.   folic acid 1 MG tablet Commonly known as: FOLVITE Take 1 mg by mouth daily.   furosemide 20 MG tablet Commonly known as: LASIX TAKE 1 TAB DAILY AS NEEDED FOR DOE, SOB, EDEMA, OR WEIGHT GAIN >3 POUNDS OVERNIGHT OR 5 POUNDS/WK What changed: See the new instructions.   pantoprazole 40 MG tablet Commonly known as: PROTONIX Take 1 tablet (40 mg tablet) by mouth 2 (two) times daily before a meal for 14 days. Then take 1 tablet by mouth once a day until told otherwise.   rivaroxaban 20 MG Tabs tablet Commonly known as: Xarelto TAKE 1 TABLET (20 MG TOTAL) BY MOUTH DAILY WITH SUPPER. Start taking on: November 30, 2019   sildenafil 100 MG tablet Commonly known as: Viagra Take 0.5 tablets (50 mg total) by mouth daily as needed for erectile dysfunction.       Discharge Instructions: Please refer to Patient Instructions section of EMR for full details.  Patient was counseled important signs and symptoms that should prompt return to medical care, changes in medications, dietary instructions, activity restrictions, and follow up appointments.   Follow-Up Appointments: Follow-up Information    Clarene Essex, MD. Schedule an appointment as soon as possible for a visit in 4 week(s).   Specialty: Gastroenterology Why: for follow up Contact information: [address] [address] [phone]        [place]. Go on 12/04/2019.   Why: at 3:35pm for hopsital follow up Contact information: [place]          Danna Hefty, DO 11/29/2019, 11:16 AM PGY-2, [place]

## 2019-11-28 NOTE — Care Management Obs Status (Signed)
[facility] NOTIFICATION   Patient Details  Name: Craig Moore MRN: [id number] Date of Birth: January 05, 1941   Medicare Observation Status Notification Given:  Yes    Marilu Favre, RN 11/28/2019, 2:00 PM

## 2019-11-28 NOTE — Progress Notes (Signed)
Family Medicine Teaching Service Daily Progress Note Intern Pager: [phone]  Patient name: Craig Moore record number: [id number] Date of birth: Mar 29, 1941 Age: 78 y.o. Gender: male  Primary Care Provider: Caroline More, DO Consultants: GI Code Status: Full Code   Pt Overview and Major Events to Date:  12/21- admitted  12/22- 1 unit pRBCs   Assessment and Plan: Craig Moore is a 78 y.o. with PMH of DVT (Xarelto), HLD, tobacco use, HTN, CAD with bare metal stent, hx of gallbladder mass on CT in 2017, HFpEF presenting with symptomatic anemia in setting of GI bleed with persistent anemia and recent further decline in hemoglobin, s/p 1 unit pRBCs.   Melena and BRBPR with hx of anemia Patient denies any SOB, chest pain, dizziness with standing, palpitations. Patient reports melena overnight but no abdominal pain. No adverse reactions to ongoing blood transfusion for recent drop in hemoglobin from 10 to 7.4.   -GI following, EGD today, colonoscopy 11/29/19 -Tell follow up EGD results   -holding Xarelto, ASA  -transfusion threshold is 8, CAD  -cardiac monitoring  -vitals per floor    Gallbladder disease/mass Seen by oncology in 2017 abd U/S 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 Repeat U/S in 2018 w/ stable mass w/ calcifications in anterior gallbladder wall consistent with adenomyomatosis and less likely malignancy. Abdominal CT on admission shows    H/o DVT Was taken off of coumadin 1 year s/p cardiac stent placement when DVT occurred in 2007 and 2014. Started on Xarelto. Home dose is Xarelto 20 mg. Hold for now in the setting of GI bleed. -Hold d/t acute GI bleed -Reconsider restarting anticoag following resolution of bleed  CAD No chest pain at this time. 2 bare metal stents placed 2006 was on coumadin and taken off 2007. DVT occurred then and was placed on xarelto. Home med includes ASA 81. -Hold ASA in the setting of acute bleed.  COPD  Tobacco use  disorder Smokes 3 cigarettes/day. Endorses dyspnea on exertion likely due from anemia. Speaking in full sentences and satting well on RA. CXR wnl. Home medication is Albuterol PRN.  -Craig Moore continue albuterol PRN - encourage smoking cessation, nicotine patch prn  HTN  BP 99-141/50-62. Does not endorse headache changes in vision or chest pain at this time. Home medication is Hydralazine 25mg  TID.  -Craig Moore hold home medication for now, given concern for bleed to avoid hypotension -consider restarting when appropriate  HLD Last lipid panel was 11/10/2019. Tot chol 156, HDL 96, LDL 47, triglycerides 69. Home medication is Lipitor 20mg . -continue home medication  HFpEF Does not appear to be volume up at this time. No LE edema and satting well on RA. Last Echo 08/17/2018 w/ LVEF of 60-65% w/o significant valvular abnormalities. Home medication is Lasix 20 mg prn. -Hold lasix for now   H/o Hyperkalemia K 4.4 on admission. Craig Moore continue to monitor on daily labs and issue lokelma if necessary -am BMP -Give lokelma PRN  CKD III  Patient reports history of hematuria but no documentation of this. Has dark urine when he does not urinated for a while but otherwise is of normal color. Cr 1.7 BUN 43 GFR 38 on admission. Baseline Cr 1.5-1.70. Craig Moore continue to monitor on BMP.   -follow Cr with BMP  -Avoid nephrotoxic drugs.    FEN/GI: Protonix 40mg  Prophylaxis: SCDsg  Disposition: discharge pending cessation of GI bleeding  Subjective:  Patient denies pain, dizziness, SOB, last dark stool reported as overnight. Tolerating 1 unit of pRBCs. Patient reports feeling  well.   Objective: Temp:  [98 F (36.7 C)-99.1 F (37.3 C)] 98.9 F (37.2 C) (12/22 1226) Pulse Rate:  [75-93] 79 (12/22 1226) Resp:  [12-21] 16 (12/22 1226) BP: (99-157)/(50-87) 139/61 (12/22 1226) SpO2:  [98 %-100 %] 99 % (12/22 1226)  Physical Exam: General: comfortable appearing elderly male lying in bed in NAD   Cardiovascular: RRR with normal S1 and S2, no murmurs appreciated, no tachycardia  Respiratory: no wheezing or crackles appreciated, normal WOB  Abdomen: NT, soft bowel sounds, non-distended  Extremities: no peripheral edema   Laboratory: Recent Labs  Lab 11/27/19 1105 11/27/19 1118 11/28/19 0603  WBC 10.2  --  6.2  HGB 9.9* 10.2* 7.4*  HCT 28.1* 30.0* 20.9*  PLT 342  --  261   Recent Labs  Lab 11/27/19 1105 11/27/19 1118 11/28/19 0603  NA 133* 132* 134*  K 4.4 4.5 4.6  CL 103 103 104  CO2 21*  --  22  BUN 43* 40* 28*  CREATININE 1.70* 1.50* 1.46*  CALCIUM 8.3*  --  8.0*  PROT 6.8  --   --   BILITOT 0.6  --   --   ALKPHOS 148*  --   --   ALT 23  --   --   AST 19  --   --   GLUCOSE 125* 120* 71   Imaging/Diagnostic Tests:   Stark Klein, MD 11/28/2019, 12:36 PM PGY-1, [place] Intern pager: [phone], text pages welcome

## 2019-11-28 NOTE — TOC Initial Note (Signed)
Transition of Care [facility]) - Initial/Assessment Note    Patient Details  Name: Craig Moore MRN: [id number] Date of Birth: Jun 04, 1941  Transition of [facility]) CM/SW Contact:    Marilu Favre, RN Phone Number: 11/28/2019, 2:02 PM  Clinical Narrative:                  Patient from home alone. Has family close by if needed. PCP is DR Caroline More  Expected Discharge Plan: Home/Self Care Barriers to Discharge: Continued Medical Work up   Patient Goals and CMS Choice Patient states their goals for this hospitalization and ongoing recovery are:: to return to home CMS Medicare.gov Compare Post Acute Care list provided to:: Patient    Expected Discharge Plan and Services Expected Discharge Plan: Home/Self Care       Living arrangements for the past 2 months: Apartment                 DME Arranged: N/A         HH Arranged: NA          Prior Living Arrangements/Services Living arrangements for the past 2 months: Apartment Lives with:: Self Patient language and need for interpreter reviewed:: Yes        Need for Family Participation in Patient Care: No (Comment) Care giver support system in place?: Yes (comment)   Criminal Activity/Legal Involvement Pertinent to Current Situation/Hospitalization: No - Comment as needed  Activities of Daily Living      Permission Sought/Granted   Permission granted to share information with : No              Emotional Assessment Appearance:: Appears stated age Attitude/Demeanor/Rapport: Engaged Affect (typically observed): Accepting Orientation: : Oriented to Self, Oriented to Place, Oriented to  Time, Oriented to Situation Alcohol / Substance Use: Not Applicable Psych Involvement: No (comment)  Admission diagnosis:  SOB (shortness of breath) [R06.02] GI bleed [K92.2] Patient Active Problem List   Diagnosis Date Noted  . GI bleed 11/27/2019  . Abnormal CT of the abdomen 11/27/2019  . CHF (congestive heart  failure) ([place]) 08/17/2018  . CHF exacerbation ([place]) 08/17/2018  . COPD exacerbation ([place])   . Hyperkalemia 07/15/2018  . 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 Anemia 04/20/2016  . 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 Elevated serum creatinine 04/10/2016  . Erectile dysfunction 08/19/2015  . Hyponatremia 08/20/2014  . Umbilical hernia [id number]  . Osteoarthritis of both hips 06/01/2012  . CAD (coronary artery disease)   . DVT (deep venous thrombosis) ([place])   . Dyslipidemia 02/23/2010  . Tobacco use disorder 02/23/2010  . Hypertension, well controlled 02/21/2010   PCP:  Caroline More, DO Pharmacy:   [id number] - [place] [place] [id number] [place] [id number] Phone: [phone] Fax: [phone]     Social Determinants of Health (SDOH) Interventions    Readmission Risk Interventions No flowsheet data found.

## 2019-11-28 NOTE — Progress Notes (Signed)
Craig Moore 11:03 AM  Subjective: Patient without any further bleeding and wants to eat and has no new complaints  Objective: Vital signs stable afebrile exam please see preassessment evaluation hemoglobin and BUN decreased CT reviewed  Assessment: GI bleed  Plan: Okay to proceed with endoscopy and if negative probably colonoscopy tomorrow and Craig Moore need an elective MRI of the pancreas as per CT report possibly as an outpatient  [facility]  office [phone] After 5PM or if no answer call [phone]

## 2019-11-28 NOTE — Anesthesia Preprocedure Evaluation (Signed)
Anesthesia Evaluation  Patient identified by MRN, date of birth, ID band Patient awake    Reviewed: Allergy & Precautions, NPO status , Patient's Chart, lab work & pertinent test results  History of Anesthesia Complications Negative for: history of anesthetic complications  Airway Mallampati: II  TM Distance: >3 FB Neck ROM: Full    Dental  (+) Edentulous Upper, Edentulous Lower   Pulmonary COPD, Current Smoker and Patient abstained from smoking.,    Pulmonary exam normal        Cardiovascular hypertension, + CAD, + Past MI, + Cardiac Stents, +CHF and + DVT  Normal cardiovascular exam     Neuro/Psych negative neurological ROS  negative psych ROS   GI/Hepatic negative GI ROS, Neg liver ROS,   Endo/Other  negative endocrine ROS  Renal/GU Renal InsufficiencyRenal disease  negative genitourinary   Musculoskeletal negative musculoskeletal ROS (+)   Abdominal   Peds  Hematology  (+) anemia ,   Anesthesia Other Findings   Reproductive/Obstetrics                             Anesthesia Physical Anesthesia Plan  ASA: III  Anesthesia Plan: MAC   Post-op Pain Management:    Induction: Intravenous  PONV Risk Score and Plan: Propofol infusion, TIVA and Treatment may vary due to age or medical condition  Airway Management Planned: Natural Airway, Nasal Cannula and Simple Face Mask  Additional Equipment: None  Intra-op Plan:   Post-operative Plan:   Informed Consent: I have reviewed the patients History and Physical, chart, labs and discussed the procedure including the risks, benefits and alternatives for the proposed anesthesia with the patient or authorized representative who has indicated his/her understanding and acceptance.       Plan Discussed with:   Anesthesia Plan Comments:         Anesthesia Quick Evaluation

## 2019-11-29 ENCOUNTER — Other Ambulatory Visit: Payer: Self-pay | Admitting: Physician Assistant

## 2019-11-29 DIAGNOSIS — K269 Duodenal ulcer, unspecified as acute or chronic, without hemorrhage or perforation: Secondary | ICD-10-CM

## 2019-11-29 DIAGNOSIS — D500 Iron deficiency anemia secondary to blood loss (chronic): Secondary | ICD-10-CM | POA: Diagnosis not present

## 2019-11-29 DIAGNOSIS — K828 Other specified diseases of gallbladder: Secondary | ICD-10-CM

## 2019-11-29 DIAGNOSIS — K921 Melena: Secondary | ICD-10-CM | POA: Diagnosis not present

## 2019-11-29 DIAGNOSIS — K862 Cyst of pancreas: Secondary | ICD-10-CM | POA: Diagnosis not present

## 2019-11-29 DIAGNOSIS — K922 Gastrointestinal hemorrhage, unspecified: Secondary | ICD-10-CM | POA: Diagnosis not present

## 2019-11-29 DIAGNOSIS — R932 Abnormal findings on diagnostic imaging of liver and biliary tract: Secondary | ICD-10-CM | POA: Diagnosis not present

## 2019-11-29 LAB — BASIC METABOLIC PANEL
Anion gap: 6 (ref 5–15)
BUN: 14 mg/dL (ref 8–23)
CO2: 22 mmol/L (ref 22–32)
Calcium: 7.8 mg/dL — ABNORMAL LOW (ref 8.9–10.3)
Chloride: 107 mmol/L (ref 98–111)
Creatinine, Ser: 1.35 mg/dL — ABNORMAL HIGH (ref 0.61–1.24)
GFR calc Af Amer: 59 mL/min — ABNORMAL LOW (ref >60)
GFR calc non Af Amer: 51 mL/min — ABNORMAL LOW (ref >60)
Glucose, Bld: 126 mg/dL — ABNORMAL HIGH (ref 70–99)
Potassium: 3.9 mmol/L (ref 3.5–5.1)
Sodium: 135 mmol/L (ref 135–145)

## 2019-11-29 LAB — TYPE AND SCREEN
ABO/RH(D): A NEG
Antibody Screen: NEGATIVE
Unit division: 0

## 2019-11-29 LAB — BPAM RBC
Blood Product Expiration Date: 202012252359
ISSUE DATE / TIME: 202012220829
Unit Type and Rh: 600

## 2019-11-29 LAB — CBC WITH DIFFERENTIAL/PLATELET
Abs Immature Granulocytes: 0.1 10*3/uL — ABNORMAL HIGH (ref 0.00–0.07)
Basophils Absolute: 0 10*3/uL (ref 0.0–0.1)
Basophils Relative: 1 %
Eosinophils Absolute: 0.6 10*3/uL — ABNORMAL HIGH (ref 0.0–0.5)
Eosinophils Relative: 9 %
HCT: 23.7 % — ABNORMAL LOW (ref 39.0–52.0)
Hemoglobin: 8.4 g/dL — ABNORMAL LOW (ref 13.0–17.0)
Immature Granulocytes: 2 %
Lymphocytes Relative: 10 %
Lymphs Abs: 0.7 10*3/uL (ref 0.7–4.0)
MCH: 36.1 pg — ABNORMAL HIGH (ref 26.0–34.0)
MCHC: 35.4 g/dL (ref 30.0–36.0)
MCV: 101.7 fL — ABNORMAL HIGH (ref 80.0–100.0)
Monocytes Absolute: 0.5 10*3/uL (ref 0.1–1.0)
Monocytes Relative: 7 %
Neutro Abs: 4.6 10*3/uL (ref 1.7–7.7)
Neutrophils Relative %: 71 %
Platelets: 244 10*3/uL (ref 150–400)
RBC: 2.33 MIL/uL — ABNORMAL LOW (ref 4.22–5.81)
RDW: 14.7 % (ref 11.5–15.5)
WBC: 6.4 10*3/uL (ref 4.0–10.5)
nRBC: 0 % (ref 0.0–0.2)

## 2019-11-29 LAB — FOLATE RBC
Folate, Hemolysate: 419 ng/mL
Folate, RBC: 1995 ng/mL (ref >498)
Hematocrit: 21 % — ABNORMAL LOW (ref 37.5–51.0)

## 2019-11-29 LAB — SURGICAL PATHOLOGY

## 2019-11-29 MED ORDER — PANTOPRAZOLE SODIUM 40 MG PO TBEC: DELAYED_RELEASE_TABLET | ORAL | 0 refills | Status: DC

## 2019-11-29 MED ORDER — RIVAROXABAN 20 MG PO TABS: ORAL_TABLET | ORAL | 3 refills | Status: DC

## 2019-11-29 NOTE — Discharge Instructions (Signed)
Thank you for allowing [place] to participate in your care!    You were admitted for an abdominal bleed. Gastroenterology (GI) was consulted and did a scope which found an ulcer. We Zakary start you on Protonix twice a day for 2 weeks then decrease to once a day. Please resume your Xarelto (rivaroxaban) at prior dose on 11/30/2019. Do NOT continue aspirin. Please return to gastroenterology clinic in 4 weeks.Please call their clinic if you develop any more symptoms.   If you experience worsening of your admission symptoms, develop shortness of breath, life threatening emergency, suicidal or homicidal thoughts you must seek medical attention immediately by calling 911 or calling your MD immediately  if symptoms less severe. -------------------------------------------------------------------------------------------------------------------------------------  Please do not take your Hydralazine. We Jemal recheck your blood pressure at your follow up visit on 12/28. We may restart at that time. Please call our clinic or go to the ED sooner if you develop any headaches or vision changes, swelling in your legs, or chest pain.  Please stop taking your Aspirin Please restart your Xarelto tomorrow (12/24). Thank you

## 2019-11-29 NOTE — Progress Notes (Signed)
Family Medicine Teaching Service Daily Progress Note Intern Pager: [phone]  Patient name: Craig Moore record number: [id number] Date of birth: [birth year] Age: 78 y.o. Gender: male  Primary Care Provider: Caroline More, DO Consultants: GI Code Status: Full Code   Pt Overview and Major Events to Date:  12/21- admitted  12/22- s/p 1 unit pRBCs, EGD: Non-bleeding duodenal ulcer with no stigmata of bleeding.   Assessment and Plan: [name] is a 78 y.o. with PMH of DVT (Xarelto), HLD, tobacco use, HTN, CAD with bare metal stent, hx of gallbladder mass on CT in 2017, HFpEF presenting with symptomatic anemia in setting of GI bleed with persistent anemia and recent further decline in hemoglobin, s/p 1 unit pRBCs.   Melena and BRBPR with hx of anemia S/p EGD on 12/22 notable for non-bleeding duodenal ulcer with no stigmata of bleeding. Per GI, PPI BID x 2 wks then every day. Plan to restart Xarelto on 12/24. Need to determine need for ASA going forward. If no further ASA then can tx with PPI x 3 months. Hgb stable at 10>7.4>8.8. AM Hgb/Hct pending. Currently asymptomatic. 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 Repeat U/S in 2018 w/ stable mass w/ calcifications in anterior gallbladder wall consistent with adenomyomatosis and less likely malignancy. Abdominal CT on admission shows new 1.5cm exophytic low attenuating lesion at inferior aspect of uncinate process of pancreas + stable appearing focal gallbladder wall  thickening with associated calcification along gallbladder fundus - recommend nonemergent MRI without and with contrast to further evaluate  H/o DVT Was taken off of coumadin 1 year s/p cardiac stent placement when DVT occurred in 2007 and 2014. Home meds: Xarelto 20mg  every day.  Holding until 12/24.  - Hold, plan to restart 12/24  CAD History of two bare metal stents placed 2006. Was on coumadin, taken off 2007. Subsequent DVT, was then placed on xarelto. Home med includes ASA 81. - Hold ASA until follow up with GI - Consider discontinuing altogether - Hold Xarelto, restart on 12/24  COPD  Tobacco use disorder Smokes 3 cigarettes/day. Continues to have stable vital signs with good O2 sats on room air.Home medication is Albuterol PRN.  - continue albuterol PRN - encourage smoking cessation, nicotine patch prn  HTN Normotensive overnight. Home emds: Hydralazine 25mg  TID.  -Rajon hold home medication for now, given concern for bleed to avoid hypotension -consider restarting when appropriate  HLD Home medication is Lipitor 20mg . -continue home medication  HFpEF (EF 60-65%) Continues to appear euvolemic. Home medication is Lasix 20 mg prn. -Hold lasix for now   H/o Hyperkalemia K stable at 3.9. - Giovanny continue to monitor on daily labs and issue lokelma if necessary - am BMP - Give lokelma PRN  CKD III  Cr 1.45>1.35 this AM. Baseline Cr 1.5-1.70.  - daily BMP - Avoid nephrotoxic drugs.   FEN/GI: Protonix 40mg  Prophylaxis: SCDs  Disposition: discharge pending cessation of GI bleeding  Subjective:  Patient doing well this morning.  Denies any  chest pain, shortness of breath, abdominal pain.  No acute events overnight.  Objective: Temp:  [98.1 F (36.7 C)-99.4 F (37.4 C)] 98.6 F (37 C) (12/23 0531) Pulse Rate:  [75-84] 76 (12/23 0531) Resp:  [12-21] 18 (12/23 0531) BP: (99-141)/(50-82) 122/82 (12/23 0531) SpO2:  [98 %-100 %] 98 % (12/23 0531)  Physical  Exam: General: Pleasant thin elderly male, lying comfortably in bed, in no acute distress with non-toxic appearance CV: regular rate and rhythm without murmurs, rubs, or gallops, no lower extremity edema Lungs: clear to auscultation bilaterally with normal work of breathing Abdomen: soft, non-tender, non-distended, normoactive bowel sounds Skin: warm, dry Extremities: warm and well perfused Neuro: Alert and oriented, speech normal  Laboratory: Recent Labs  Lab 11/27/19 1105 11/27/19 1118 11/28/19 0603 11/28/19 1327  WBC 10.2  --  6.2  --   HGB 9.9* 10.2* 7.4* 8.8*  HCT 28.1* 30.0* 20.9* 23.7*  PLT 342  --  261  --    Recent Labs  Lab 11/27/19 1105 11/27/19 1118 11/28/19 0603 11/29/19 0804  NA 133* 132* 134* 135  K 4.4 4.5 4.6 3.9  CL 103 103 104 107  CO2 21*  --  22 22  BUN 43* 40* 28* 14  CREATININE 1.70* 1.50* 1.46* 1.35*  CALCIUM 8.3*  --  8.0* 7.8*  PROT 6.8  --   --   --   BILITOT 0.6  --   --   --   ALKPHOS 148*  --   --   --   ALT 23  --   --   --   AST 19  --   --   --   GLUCOSE 125* 120* 71 126*   Imaging/Diagnostic Tests:   Danna Hefty, DO 11/29/2019, 9:45 AM PGY-2, [employer] Intern pager: [phone], text pages welcome

## 2019-11-29 NOTE — Progress Notes (Signed)
Craig Moore  to be D/C'd Home per MD order.  Discussed with the patient and all questions fully answered.   VSS, Skin clean, dry and intact without evidence of skin break down, no evidence of skin tears noted. IV catheter discontinued intact. Site without signs and symptoms of complications. Dressing and pressure applied.   An After Visit Summary was printed and given to the patient.    D/C education completed with patient/family including follow up instructions, medication list, d/c activities limitations if indicated, with other d/c instructions as indicated by MD - patient able to verbalize understanding, all questions fully answered.    Patient instructed to return to ED, call 911, or call MD for any changes in condition.    Patient escorted via [place], and D/C home via private car.

## 2019-11-29 NOTE — Progress Notes (Signed)
Craig Moore 9:47 AM  Subjective: Patient doing well without any obvious GI complaints and there might have been a little old blood in a solid stool this morning and minimal dark on the toilet tissue but not in the water and his case discussed with the hospital team and we answered all of his questions  Objective: Vital signs stable afebrile no acute distress abdomen is soft nontender BUN and creatinine decreased hemoglobin pending but probably expected to drop a little with hydration  Assessment: Duodenal bulb ulcer and patient on blood thinners and aspirin  Plan: Okay with me to go home and reevaluate whether he truly needs aspirin but at least hold it for a few weeks and can decrease pump inhibitor to once a day in 2 weeks and he Craig Moore call me if signs of bleeding continue at home otherwise I Craig Moore see back in a few weeks to recheck CBC guaiacs and decide if colonoscopy is needed and Craig Moore need a outpatient pancreatic MRI as well  [facility]  office [phone] After 5PM or if no answer call [phone]

## 2019-12-04 ENCOUNTER — Encounter: Payer: Self-pay | Admitting: Family Medicine

## 2019-12-04 ENCOUNTER — Telehealth: Payer: Self-pay

## 2019-12-04 ENCOUNTER — Other Ambulatory Visit: Payer: Self-pay

## 2019-12-04 ENCOUNTER — Ambulatory Visit (INDEPENDENT_AMBULATORY_CARE_PROVIDER_SITE_OTHER): Payer: Medicare Other | Admitting: Family Medicine

## 2019-12-04 VITALS — BP 125/80 | HR 95 | Wt 151.6 lb

## 2019-12-04 DIAGNOSIS — K8689 Other specified diseases of pancreas: Secondary | ICD-10-CM | POA: Diagnosis not present

## 2019-12-04 DIAGNOSIS — D500 Iron deficiency anemia secondary to blood loss (chronic): Secondary | ICD-10-CM | POA: Diagnosis not present

## 2019-12-04 DIAGNOSIS — K922 Gastrointestinal hemorrhage, unspecified: Secondary | ICD-10-CM

## 2019-12-04 DIAGNOSIS — I10 Essential (primary) hypertension: Secondary | ICD-10-CM | POA: Diagnosis not present

## 2019-12-04 NOTE — Assessment & Plan Note (Signed)
Blood pressure remains normotensive while off of Hydralazine. Patient instructed to continue to hold Hydralazine. Follow up in 1 month with PCP for blood pressure check. Reevaluate at that time if need to restart.

## 2019-12-04 NOTE — Progress Notes (Signed)
Subjective:   Patient ID: Craig Moore    DOB: 1941/10/19, 78 y.o. male   MRN: [id number]  Craig Moore is a 78 y.o. 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  Recent Symptomatic Anemia 2/2 to GI Bleed Patient is here for hospital follow up after being admitted at [facility] on 12/21-12/23 for symptomatic anemia in setting of acute GI bleed. Patient received one unit of pRBC's. GI was consulted and performed an EGD which revealed a non-bleeding duodenal ulcer and recommended holding aspirin for 2-3 weeks, follow up in GI office in 4 weeks, continue Protonix 40mg  BID for 3 months. He was instructed to resume his xarelto on 12/24 which he notes he did.  Transufsion threshold of 8 for CAD. He hgb at discharge was 8.4. Notes bleeding has stopped and he is having normal colored bowel movements.Denies any other acute bleeding.   HTN: BP today 125/80. Normally takes Hydralazine TID but this was held at discharge given normotensive blood pressures during admission. Denies any chest pain, SOB, headaches or vision changes.  Pancreatic mass: CT abdomen/pelvis notable for a 1.5 cm exophytic low attenuating lesion from the inferior aspect of the uncinate process of the pancreas, new since the prior CT. Further evaluation with MRI without and with contrast on a nonemergent basis recommended.  Review of Systems:  Per HPI.   [facility], medications and smoking status reviewed.  Objective:   BP 125/80   Pulse 95   Wt 151 lb 9.6 oz (68.8 kg)   SpO2 98%   BMI 21.75 kg/m  Vitals and nursing note reviewed.  General: pleasant thin, older male, in no acute distress with non-toxic appearance HEENT: normocephalic, atraumatic, moist mucous membranes Resp: Breathing comfortably on room air, speaking in full sentences, unlabored breathing MSK:  gait normal Neuro: Alert and oriented, speech  normal  Assessment & Plan:   Acute upper GI bleeding Resolved. Nonbleeding duodenal ulcer observed on EGD. Restoration of normal bowel movements since discharge. Denies any symptoms. Patient to continue Protonix 40mg  BID x 1 week then QD thereafter plus follow up with GI in 3 week for repeat labs and consideration of colonoscopy at that time. Patient to continue to hold aspirin at this time, continue Xarelto as prescribed. Wendell obtain H/H to monitor. Strict return precautions discussed. He voiced understanding and agreed to plan. Follow up in 1 month with PCP for follow up.  Hypertension, well controlled Blood pressure remains normotensive while off of Hydralazine. Patient instructed to continue to hold Hydralazine. Follow up in 1 month with PCP for blood pressure check. Reevaluate at that time if need to restart.   Pancreatic mass CT abdomen/pelvis notable for a new 1.5cm exophytic low attenuating lesion at inferior aspect of uncinate process of pancrease. Non-emergent MRI ordered and scheduled for 12/22/19 for further evaluation. Patient notified. Follow up pending results.  Orders Placed This Encounter  Procedures  . MR ABDOMEN WWO CONTRAST    Standing Status:   Future    Standing Expiration Date:   02/03/2021    Order Specific Question:   ** REASON FOR EXAM (FREE TEXT)    Answer:   mass on inferior aspect of uncinate process of pancreas    Order Specific Question:   If indicated for the ordered procedure, I authorize the administration of contrast media per Radiology protocol    Answer:  Yes    Order Specific Question:   What is the patient's sedation requirement?    Answer:   Anti-anxiety    Order Specific Question:   Does the patient have a pacemaker or implanted devices?    Answer:   No    Comments:   Bare metal stents    Order Specific Question:   Radiology Contrast Protocol - do NOT remove file path    Answer:   [email or web address]    Order Specific  Question:   Preferred imaging location?    Answer:   [facility] (table limit-500 lbs)  . Hemoglobin and Hematocrit, Blood   No orders of the defined types were placed in this encounter.   Mina Marble, DO PGY-2, [facility] 12/04/2019 9:39 PM

## 2019-12-04 NOTE — Assessment & Plan Note (Addendum)
Resolved. Nonbleeding duodenal ulcer observed on EGD. Restoration of normal bowel movements since discharge. Denies any symptoms. Patient to continue Protonix 40mg  BID x 1 week then QD thereafter plus follow up with GI in 3 week for repeat labs and consideration of colonoscopy at that time. Patient to continue to hold aspirin at this time, continue Xarelto as prescribed. Wilfred obtain H/H to monitor. Strict return precautions discussed. He voiced understanding and agreed to plan. Follow up in 1 month with PCP for follow up.

## 2019-12-04 NOTE — Assessment & Plan Note (Deleted)
CT abdomen/pelvis notable for a new 1.5cm exophytic low attenuating lesion at inferior aspect of uncinate process of pancrease. Non-emergent MRI ordered and scheduled for 12/22/19 for further evaluation. Patient notified. Follow up pending results.

## 2019-12-04 NOTE — Telephone Encounter (Signed)
Called patient and informed patient of MR Abdomen w/wo contrast on 12/22/2019 at [facility]. Appointment is at 1200 with an arrival time of 12.  NPO for 4 hours prior to appointment.  Patient verbalized understanding.  Craig Moore, [place]

## 2019-12-04 NOTE — Patient Instructions (Addendum)
I obtained repeat labs to check your hemoglobin. I Craig Moore contact you with the results. Please be sure to follow up with the gastroenterologist office in 3 weeks for further evaluation. Please be seen sooner if develops any further bleeding in your stools, develop dizziness/lightheadedness.  Please follow up with Dr. Tammi Klippel in 1 month for a blood pressure check.Since you blood pressure is so good today, I recommend you continue to not take this medication. Please see our PCP if you develop chest pain, shortness of breath, headaches, or vision changes.  We have scheduled you an MRI of your abdomen to further evaluate the pancreatic mass seen on CT scan. Please be sure to attend this as scheduled.   Take care [name]

## 2019-12-04 NOTE — Assessment & Plan Note (Signed)
CT abdomen/pelvis notable for a new 1.5cm exophytic low attenuating lesion at inferior aspect of uncinate process of pancrease. Non-emergent MRI ordered and scheduled for 12/22/19 for further evaluation. Patient notified. Follow up pending results.

## 2019-12-05 LAB — HEMOGLOBIN AND HEMATOCRIT, BLOOD
Hematocrit: 27.9 % — ABNORMAL LOW (ref 37.5–51.0)
Hemoglobin: 9.8 g/dL — ABNORMAL LOW (ref 13.0–17.7)

## 2019-12-13 ENCOUNTER — Encounter: Payer: Self-pay | Admitting: Family Medicine

## 2019-12-22 ENCOUNTER — Other Ambulatory Visit: Payer: Self-pay

## 2019-12-22 ENCOUNTER — Ambulatory Visit (HOSPITAL_COMMUNITY)
Admission: RE | Admit: 2019-12-22 | Discharge: 2019-12-22 | Disposition: A | Discharge status: Home / Self Care | Payer: Medicare Other | Source: Ambulatory Visit | Attending: Family Medicine | Admitting: Family Medicine

## 2019-12-22 DIAGNOSIS — K828 Other specified diseases of gallbladder: Secondary | ICD-10-CM | POA: Diagnosis not present

## 2019-12-22 DIAGNOSIS — N281 Cyst of kidney, acquired: Secondary | ICD-10-CM | POA: Diagnosis not present

## 2019-12-22 DIAGNOSIS — K8689 Other specified diseases of pancreas: Secondary | ICD-10-CM | POA: Insufficient documentation

## 2019-12-22 MED ORDER — GADOBUTROL 1 MMOL/ML IV SOLN
8.0000 mL | Freq: Once | INTRAVENOUS | Status: AC | PRN
  Administered 2019-12-22: 8 mL via INTRAVENOUS

## 2019-12-25 NOTE — Progress Notes (Signed)
Patient ID: Craig Moore                 DOB: 03-26-1941                      MRN: [id number]     HPI: Craig Moore is a 79 y.o. male referred by Dr. Tarry Kos to HTN clinic. 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 Meda Coffee on 10/26/19, where he had elevated BP of 148/76 mmHg, so hydralazine was started. On 11/27/19, he was admitted into the ED for GI bleed on Xarelto with reports of black tarry stools He also reported leg weakness and he had tachycardia at 122. Hydralazine held at discharge due to normotensive readings in hospital. Xarelto was re-started at discharge At follow-up with Dr. Tarry Kos on 12/04/19, the patient had at goal BP readings so hydralazine remains on hold.   Patient presents at HTN clinic today for HTN medication management. The patient was hesitant to add a blood pressure medication. He attributes hydralazine to causing recent GI bleed, despite explaining the probable cause was Xarelto. He takes furosemide 2-3 times a month for abdominal swelling. He continues to limit sodium in diet and rarely eats out. Discussed reports of HCTZ stopped due to headaches, however the patient states he does not remember why HCTZ was stopped. Patient states he has never had headaches due to medications. BP in clinic was 158/76 and HR 116.  Current HTN meds: furosemide 20 mg once daily PRN edema (2-3 times a month) Previously tried: amlodipine (constipation), lisinopril 20 mg (hyperkalemia), metoprolol succinate 50 mg (new Mobitz type I block), HCTZ (headache per chart review),  hydralazine 25 mg three times a day (on hold since ED discharge 12/20 and pt not interested in re-starting) BP goal: < 130/80 mmHG  Family History: Father- stroke, heart disease, diabetes; Mother- cancer; Sister- Breast cancer  Social History: The patient reports that he has been  smoking cigarettes. He has a 13.75 pack-year smoking history. He has never used smokeless tobacco. He reports current alcohol use of about 8.0 standard drinks of alcohol per week. He reports that he does not use drugs.   Diet: Uses a salt substitute that contains no sodium. Only eats out three times a month. [employer] every few weeks. Skips breakfast. Drinks 1/2 cup of coffee and 1/2 can of Pepsi or orange soda daily. Drinks ~2 bottles of water daily. Lunch- 1/2 sandwich or meat w/ veggies. Dinner- beans, collard greens, only eats carbs occasionally.   Exercise: Denies exercise  Home BP readings: None. Waiting on a new BP cuff  Wt Readings from Last 3 Encounters:  12/04/19 151 lb 9.6 oz (68.8 kg)  11/27/19 155 lb (70.3 kg)  10/26/19 155 lb 12.8 oz (70.7 kg)   BP Readings from Last 3 Encounters:  12/04/19 125/80  11/29/19 117/61  11/27/19 (!) 147/63   Pulse Readings from Last 3 Encounters:  12/04/19 95  11/29/19 82  11/27/19 (!) 122    Renal function: CrCl cannot be calculated (Patient's most recent lab result is older than the maximum 21 days allowed.).  Past Medical History:  Diagnosis Date  . CAD (coronary artery disease)    Non-STEMI May, 2006, bare-metal stent  //   nuclear, January, 2012, EF 70%, no ischemia  . CHF (congestive heart failure) ([place])   .  CKD (chronic kidney disease), stage III   . DVT (deep venous thrombosis) ([place])    December, 2011, Coumadin therapy  . Dyspnea   . Ejection fraction    EF 55%, echo, October, 2006, technically difficult  //  EF 70%, nuclear, January, 2012  . Groin pain    June, 2013  . Hyperlipidemia   . Hypertension   . Myocardial infarction ([place])   . Pre-syncope    ER, January, 2011, dehydration  . Tobacco abuse     Current Outpatient Medications on File Prior to Visit  Medication Sig Dispense Refill  . acetaminophen (TYLENOL) 500 MG tablet Take 1,000 mg by mouth every 6 (six) hours as needed for mild pain or headache.    .  albuterol (VENTOLIN HFA) 108 (90 Base) MCG/ACT inhaler Inhale 2 puffs into the lungs every 6 (six) hours as needed for wheezing or shortness of breath. 18 g 0  . atorvastatin (LIPITOR) 20 MG tablet Take 1 tablet (20 mg total) by mouth daily at 6 PM. 90 tablet 3  . b complex vitamins capsule Take 1 capsule by mouth daily.    . folic acid (FOLVITE) 1 MG tablet Take 1 mg by mouth daily.    . furosemide (LASIX) 20 MG tablet TAKE 1 TAB DAILY AS NEEDED FOR DOE, SOB, EDEMA, OR WEIGHT GAIN >3 POUNDS OVERNIGHT OR 5 POUNDS/WK (Patient taking differently: Take 20 mg by mouth See admin instructions. TAKE 1 TAB DAILY AS NEEDED FOR DOE, SOB, EDEMA, OR WEIGHT GAIN >3 POUNDS OVERNIGHT OR 5 POUNDS/WK) 90 tablet 3  . pantoprazole (PROTONIX) 40 MG tablet Take 1 tablet (40 mg tablet) by mouth 2 (two) times daily before a meal for 14 days. Then take 1 tablet by mouth once a day until told otherwise. 60 tablet 0  . rivaroxaban (XARELTO) 20 MG TABS tablet TAKE 1 TABLET (20 MG TOTAL) BY MOUTH DAILY WITH SUPPER. 90 tablet 3  . sildenafil (VIAGRA) 100 MG tablet Take 0.5 tablets (50 mg total) by mouth daily as needed for erectile dysfunction. 10 tablet 11   No current facility-administered medications on file prior to visit.    No Known Allergies   Assessment/Plan:  1. Hypertension -  BP remains above goal of < 130/80 mmHg. Patient has exhausted all other blood pressure medications except thiazide diuretics and aldosterone antagonists. Prefer to start indapamide 1.25 mg once daily due to less likelihood of electrolyte disturbances compared to other thiazide diuretics. Advised patient to start checking blood pressure once a day once he gets a new BP cuff and to bring his blood pressure cuff and readings to the next appointment. [place] follow-up in 2 weeks with BM[facility].   Julieta Bellini, PharmD Candidate  [place], PGY-2 Pharmacy Resident

## 2019-12-26 ENCOUNTER — Ambulatory Visit (INDEPENDENT_AMBULATORY_CARE_PROVIDER_SITE_OTHER): Payer: Medicare Other | Admitting: Pharmacist

## 2019-12-26 ENCOUNTER — Other Ambulatory Visit: Payer: Self-pay

## 2019-12-26 VITALS — BP 158/76 | HR 116

## 2019-12-26 DIAGNOSIS — I10 Essential (primary) hypertension: Secondary | ICD-10-CM | POA: Diagnosis not present

## 2019-12-26 MED ORDER — INDAPAMIDE 1.25 MG PO TABS: 1.2500 mg | ORAL_TABLET | Freq: Every day | ORAL | 11 refills | Status: DC

## 2019-12-26 NOTE — Patient Instructions (Addendum)
It was a pleasure seeing you in clinic today Mr. Lockamy!  Today the plan is... 1. Start indapamide 1.25 mg once daily  2. Check your blood pressure once daily (write down the date, time, blood pressure reading, pulse reading). 3. Bring your blood pressure machine in to your next appointment  Please call the PharmD clinic at [phone] if you have any questions that you would like to speak with a pharmacist about [facility], [place], [place]).

## 2020-01-03 ENCOUNTER — Other Ambulatory Visit: Payer: Self-pay

## 2020-01-03 DIAGNOSIS — H40023 Open angle with borderline findings, high risk, bilateral: Secondary | ICD-10-CM | POA: Diagnosis not present

## 2020-01-03 MED ORDER — PANTOPRAZOLE SODIUM 40 MG PO TBEC: DELAYED_RELEASE_TABLET | ORAL | 0 refills | Status: DC

## 2020-01-05 ENCOUNTER — Encounter: Payer: Self-pay | Admitting: Family Medicine

## 2020-01-05 ENCOUNTER — Telehealth: Payer: Self-pay | Admitting: Family Medicine

## 2020-01-05 DIAGNOSIS — K8689 Other specified diseases of pancreas: Secondary | ICD-10-CM

## 2020-01-05 DIAGNOSIS — N281 Cyst of kidney, acquired: Secondary | ICD-10-CM

## 2020-01-05 DIAGNOSIS — K828 Other specified diseases of gallbladder: Secondary | ICD-10-CM

## 2020-01-05 HISTORY — DX: Cyst of kidney, acquired: N28.1

## 2020-01-05 NOTE — Telephone Encounter (Signed)
Spoke to patient in regards to MRI results. Explained findings and recommendations. Craig Moore plan to repeat MRI abdomen in 1 year, sooner if develops any red flag symptoms. Patient understood and agreed to plan. Patient also inquired about COVID vaccine and when he can get vaccinated. Discussed current county plans and recommended he check in online daily to look for available appointments to schedule as soon as possible.

## 2020-01-10 ENCOUNTER — Ambulatory Visit: Payer: Medicare Other

## 2020-01-10 ENCOUNTER — Ambulatory Visit: Payer: Medicare Other | Admitting: Family Medicine

## 2020-01-11 DIAGNOSIS — A048 Other specified bacterial intestinal infections: Secondary | ICD-10-CM | POA: Diagnosis not present

## 2020-01-11 DIAGNOSIS — K269 Duodenal ulcer, unspecified as acute or chronic, without hemorrhage or perforation: Secondary | ICD-10-CM | POA: Diagnosis not present

## 2020-01-11 DIAGNOSIS — K5901 Slow transit constipation: Secondary | ICD-10-CM | POA: Diagnosis not present

## 2020-01-14 NOTE — Progress Notes (Signed)
   CHIEF COMPLAINT / HPI:  Hypertension: - Medications: indapamide 1.25 mg daily - Compliance: good  - Checking BP at home: no - Denies any SOB, CP, vision changes, LE edema, medication SEs, or symptoms of hypotension - Diet: "sketchy". Sometimes has an appetite, sometimes not. Eating mainly baked foods. Very little fried food, only 2 pieces/month  - Exercise: walks daily   Recent GI bleed GI requested he get CBC while here since he was scheduled to get blood work. Asymptomatic.  Recent admission in December for this.  At that time received EGD which showed nonbleeding duodenal ulcer.  Aspirin has been held.  Patient is on Protonix.  PERTINENT  PMH / PSH: CAD, CHF, duodenal ulcer, HTN,    OBJECTIVE: BP 140/70   Pulse 99   Wt 146 lb 12.8 oz (66.6 kg)   SpO2 98%   BMI 21.06 kg/m   Gen: awake and alert, NAD Cardio: RRR, no MRG Resp: CTAB, no increased WOB, no wheezes, rales or rhonchi GI: soft, non tender, non distended, bowel sounds present Ext: no edema   ASSESSMENT / PLAN:  Hypertension, well controlled Normotensive.  We Luay continue indapamide per pharmacy recommendations.  Binyomin obtain BMP today.  Follow-up in 3 months.  Anemia Durante obtain CBC today as this was requested by his gastroenterologist.  Continue to hold aspirin until recommended by his cardiologist.  Continue Protonix     Caroline More, DO  PGY-3 [place]

## 2020-01-15 ENCOUNTER — Other Ambulatory Visit: Payer: Self-pay

## 2020-01-15 ENCOUNTER — Ambulatory Visit (INDEPENDENT_AMBULATORY_CARE_PROVIDER_SITE_OTHER): Payer: Medicare Other | Admitting: Family Medicine

## 2020-01-15 VITALS — BP 140/70 | HR 99 | Wt 146.8 lb

## 2020-01-15 DIAGNOSIS — I10 Essential (primary) hypertension: Secondary | ICD-10-CM

## 2020-01-15 DIAGNOSIS — Z8719 Personal history of other diseases of the digestive system: Secondary | ICD-10-CM

## 2020-01-15 DIAGNOSIS — D649 Anemia, unspecified: Secondary | ICD-10-CM

## 2020-01-15 MED ORDER — INDAPAMIDE 1.25 MG PO TABS: 1.2500 mg | ORAL_TABLET | Freq: Every day | ORAL | 11 refills | Status: DC

## 2020-01-15 NOTE — Patient Instructions (Signed)

## 2020-01-16 LAB — BASIC METABOLIC PANEL
BUN/Creatinine Ratio: 5 — ABNORMAL LOW (ref 10–24)
BUN: 7 mg/dL — ABNORMAL LOW (ref 8–27)
CO2: 20 mmol/L (ref 20–29)
Calcium: 8.8 mg/dL (ref 8.6–10.2)
Chloride: 101 mmol/L (ref 96–106)
Creatinine, Ser: 1.55 mg/dL — ABNORMAL HIGH (ref 0.76–1.27)
GFR calc Af Amer: 50 mL/min/{1.73_m2} — ABNORMAL LOW (ref >59)
GFR calc non Af Amer: 43 mL/min/{1.73_m2} — ABNORMAL LOW (ref >59)
Glucose: 97 mg/dL (ref 65–99)
Potassium: 4.2 mmol/L (ref 3.5–5.2)
Sodium: 136 mmol/L (ref 134–144)

## 2020-01-16 LAB — CBC
Hematocrit: 37.5 % (ref 37.5–51.0)
Hemoglobin: 12.7 g/dL — ABNORMAL LOW (ref 13.0–17.7)
MCH: 33.4 pg — ABNORMAL HIGH (ref 26.6–33.0)
MCHC: 33.9 g/dL (ref 31.5–35.7)
MCV: 99 fL — ABNORMAL HIGH (ref 79–97)
Platelets: 240 10*3/uL (ref 150–450)
RBC: 3.8 x10E6/uL — ABNORMAL LOW (ref 4.14–5.80)
RDW: 11.7 % (ref 11.6–15.4)
WBC: 6.1 10*3/uL (ref 3.4–10.8)

## 2020-01-16 NOTE — Assessment & Plan Note (Signed)
Normotensive.  We Ishan continue indapamide per pharmacy recommendations.  Wiatt obtain BMP today.  Follow-up in 3 months.

## 2020-01-16 NOTE — Assessment & Plan Note (Addendum)
Craig Moore obtain CBC today as this was requested by his gastroenterologist.  Continue to hold aspirin until recommended by his cardiologist.  Continue Protonix

## 2020-01-20 ENCOUNTER — Ambulatory Visit: Payer: Medicare Other | Attending: Internal Medicine

## 2020-01-20 DIAGNOSIS — Z23 Encounter for immunization: Secondary | ICD-10-CM

## 2020-01-20 NOTE — Progress Notes (Signed)
   Covid-19 Vaccination Clinic  Name:  Craig Moore    MRN: [id number] DOB: 10-28-1941  01/20/2020  Mr. Craig Moore was observed post Covid-19 immunization for 15 minutes without incidence. He was provided with Vaccine Information Sheet and instruction to access the V-Safe system.   Mr. Craig Moore was instructed to call 911 with any severe reactions post vaccine: Marland Kitchen Difficulty breathing  . Swelling of your face and throat  . A fast heartbeat  . A bad rash all over your body  . Dizziness and weakness    Immunizations Administered    Name Date Dose VIS Date Route   Pfizer COVID-19 Vaccine 01/20/2020  9:39 AM 0.3 mL 11/17/2019 Intramuscular   Manufacturer: [place]   Lot: [id number]   [place]: [id number]

## 2020-01-29 ENCOUNTER — Other Ambulatory Visit: Payer: Self-pay | Admitting: *Deleted

## 2020-01-30 MED ORDER — PANTOPRAZOLE SODIUM 40 MG PO TBEC: DELAYED_RELEASE_TABLET | ORAL | 0 refills | Status: DC

## 2020-02-11 ENCOUNTER — Ambulatory Visit: Payer: 59 | Attending: Internal Medicine

## 2020-02-11 DIAGNOSIS — Z23 Encounter for immunization: Secondary | ICD-10-CM

## 2020-02-11 NOTE — Progress Notes (Signed)
   Covid-19 Vaccination Clinic  Name:  Craig Moore    MRN: [id number] DOB: May 17, 1941  02/11/2020  Mr. Craig Moore was observed post Covid-19 immunization for 15 minutes without incident. He was provided with Vaccine Information Sheet and instruction to access the V-Safe system.   Mr. Craig Moore was instructed to call 911 with any severe reactions post vaccine: Marland Kitchen Difficulty breathing  . Swelling of face and throat  . A fast heartbeat  . A bad rash all over body  . Dizziness and weakness   Immunizations Administered    Name Date Dose VIS Date Route   Pfizer COVID-19 Vaccine 02/11/2020  2:06 PM 0.3 mL 11/17/2019 Intramuscular   Manufacturer: [place]   Lot: [id number]   [place]: [id number]

## 2020-02-22 ENCOUNTER — Other Ambulatory Visit: Payer: Self-pay | Admitting: Family Medicine

## 2020-03-11 ENCOUNTER — Ambulatory Visit: Payer: 59 | Admitting: Family Medicine

## 2020-03-27 ENCOUNTER — Other Ambulatory Visit: Payer: Self-pay | Admitting: Family Medicine

## 2020-04-08 ENCOUNTER — Ambulatory Visit: Payer: 59 | Admitting: Family Medicine

## 2020-04-10 ENCOUNTER — Other Ambulatory Visit: Payer: Self-pay | Admitting: Family Medicine

## 2020-04-11 ENCOUNTER — Encounter: Payer: Self-pay | Admitting: Family Medicine

## 2020-04-11 ENCOUNTER — Other Ambulatory Visit: Payer: Self-pay

## 2020-04-11 ENCOUNTER — Ambulatory Visit (INDEPENDENT_AMBULATORY_CARE_PROVIDER_SITE_OTHER): Payer: 59 | Admitting: Family Medicine

## 2020-04-11 DIAGNOSIS — I10 Essential (primary) hypertension: Secondary | ICD-10-CM

## 2020-04-11 NOTE — Patient Instructions (Signed)

## 2020-04-11 NOTE — Assessment & Plan Note (Signed)
Elevated blood pressures in office today.  Seems to be normotensive at home.  Shared decision making with patient.  We Craig Moore plan to keep medications as they are and not increase any doses or adding another agent.  Patient to follow-up in 1 month so we can ensure improvement.  Patient Craig Moore check his blood pressure daily and call if any elevated pressures at home.  At next visit patient Craig Moore bring his blood pressure cuff so we can ensure that it is working correctly.  Follow-up in 1 month.

## 2020-04-11 NOTE — Progress Notes (Signed)
    SUBJECTIVE:   CHIEF COMPLAINT / HPI:   Hypertension: - Medications: indapamide 1.25 mg daily - Compliance: good  - Checking BP at home: yes, this AM was 122/78 - Denies any SOB, CP, vision changes, LE edema, medication SEs, or symptoms of hypotension - Diet: no changes  - Exercise: no changes   Recent GI Bleed Patient had virtual visit with GI, was going to review MRI and then f/u with patient. Is not longer taking ASA. Was told not to re-start. No further blood in stool.   PERTINENT  PMH / PSH: CHF, CAD, COPD, HTN, HLD, anemia   OBJECTIVE:   BP (!) 154/76   Pulse (!) 106   Wt 147 lb 9.6 oz (67 kg)   SpO2 97%   BMI 21.18 kg/m   Gen: awake and alert, NAD Cardio: regular rate Resp: speaking full sentences, no increased WOB Ext: no edema Neuro: normal gait, no focal deficits   ASSESSMENT/PLAN:   Hypertension, well controlled Elevated blood pressures in office today.  Seems to be normotensive at home.  Shared decision making with patient.  We Momin plan to keep medications as they are and not increase any doses or adding another agent.  Patient to follow-up in 1 month so we can ensure improvement.  Patient Fannie check his blood pressure daily and call if any elevated pressures at home.  At next visit patient Revel bring his blood pressure cuff so we can ensure that it is working correctly.  Follow-up in 1 month.     Caroline More, [place]

## 2020-05-09 ENCOUNTER — Encounter: Payer: Self-pay | Admitting: Family Medicine

## 2020-05-09 ENCOUNTER — Other Ambulatory Visit: Payer: Self-pay

## 2020-05-09 ENCOUNTER — Ambulatory Visit (INDEPENDENT_AMBULATORY_CARE_PROVIDER_SITE_OTHER): Payer: 59 | Admitting: Family Medicine

## 2020-05-09 DIAGNOSIS — I10 Essential (primary) hypertension: Secondary | ICD-10-CM

## 2020-05-09 MED ORDER — RIVAROXABAN 20 MG PO TABS: ORAL_TABLET | ORAL | 3 refills | Status: DC

## 2020-05-09 MED ORDER — PANTOPRAZOLE SODIUM 40 MG PO TBEC: DELAYED_RELEASE_TABLET | ORAL | 1 refills | Status: DC

## 2020-05-09 MED ORDER — ALBUTEROL SULFATE HFA 108 (90 BASE) MCG/ACT IN AERS: 2.0000 | INHALATION_SPRAY | Freq: Four times a day (QID) | RESPIRATORY_TRACT | 0 refills | Status: DC | PRN

## 2020-05-09 MED ORDER — ATORVASTATIN CALCIUM 20 MG PO TABS: 20.0000 mg | ORAL_TABLET | Freq: Every day | ORAL | 3 refills | Status: DC

## 2020-05-09 MED ORDER — INDAPAMIDE 1.25 MG PO TABS: 1.2500 mg | ORAL_TABLET | Freq: Every day | ORAL | 11 refills | Status: DC

## 2020-05-09 NOTE — Progress Notes (Signed)
    SUBJECTIVE:   CHIEF COMPLAINT / HPI:   Hypertension: Patient with elevated blood pressure in office during last visit on 04/11/2020.  Normotensive at home.  Was advised to bring in blood pressure cuff and check blood pressures daily at home.  Home medications include indapamide 1.25 mg daily  - Medications: indapamide 1.25mg  daily - Compliance: good  - Checking BP at home: normotensive at home. Learned today with CMA how to correctly check BP  - Denies any SOB, CP, vision changes, LE edema, medication SEs, or symptoms of hypotension - Diet: beans, vegetables, potatoes, rice. Decreased salt intake  - Exercise: walks daily, tries to park far away from stores to increase his walking daily   PERTINENT  PMH / PSH: CAD, CHF, HTN, DVT  OBJECTIVE:   BP (!) 142/72   Pulse (!) 109   Wt 145 lb 12.8 oz (66.1 kg)   SpO2 96%   BMI 20.92 kg/m   Gen: awake and alert, NAD Cardio: RRR, no MRG Resp: CTAB, no wheezes, rales or rhonchi GI: soft, non tender, non distended, bowel sounds present  Ext: no edema  ASSESSMENT/PLAN:   Hypertension, well controlled Within goal.  Advised to continue indapamide.  Patient now understands how to check blood pressure at home so we [place] check this daily.  Follow-up in 3 months, sooner if worsening.     Caroline More, [place]

## 2020-05-09 NOTE — Assessment & Plan Note (Signed)
Within goal.  Advised to continue indapamide.  Patient now understands how to check blood pressure at home so we [place] check this daily.  Follow-up in 3 months, sooner if worsening.

## 2020-05-09 NOTE — Patient Instructions (Signed)

## 2020-05-31 ENCOUNTER — Other Ambulatory Visit: Payer: Self-pay | Admitting: Family Medicine

## 2020-11-03 ENCOUNTER — Other Ambulatory Visit: Payer: Self-pay | Admitting: Cardiology

## 2020-11-03 DIAGNOSIS — R0609 Other forms of dyspnea: Secondary | ICD-10-CM

## 2020-11-03 DIAGNOSIS — I2510 Atherosclerotic heart disease of native coronary artery without angina pectoris: Secondary | ICD-10-CM

## 2020-11-03 DIAGNOSIS — I119 Hypertensive heart disease without heart failure: Secondary | ICD-10-CM

## 2020-11-03 DIAGNOSIS — I10 Essential (primary) hypertension: Secondary | ICD-10-CM

## 2020-11-03 DIAGNOSIS — E785 Hyperlipidemia, unspecified: Secondary | ICD-10-CM

## 2020-11-03 DIAGNOSIS — R0600 Dyspnea, unspecified: Secondary | ICD-10-CM

## 2020-11-17 ENCOUNTER — Other Ambulatory Visit: Payer: Self-pay | Admitting: Cardiology

## 2020-11-17 DIAGNOSIS — I2510 Atherosclerotic heart disease of native coronary artery without angina pectoris: Secondary | ICD-10-CM

## 2020-11-17 DIAGNOSIS — I10 Essential (primary) hypertension: Secondary | ICD-10-CM

## 2020-11-17 DIAGNOSIS — R0609 Other forms of dyspnea: Secondary | ICD-10-CM

## 2020-11-17 DIAGNOSIS — E785 Hyperlipidemia, unspecified: Secondary | ICD-10-CM

## 2020-11-17 DIAGNOSIS — R0600 Dyspnea, unspecified: Secondary | ICD-10-CM

## 2020-11-17 DIAGNOSIS — I119 Hypertensive heart disease without heart failure: Secondary | ICD-10-CM

## 2021-01-09 ENCOUNTER — Other Ambulatory Visit: Payer: Self-pay

## 2021-01-09 MED ORDER — PANTOPRAZOLE SODIUM 40 MG PO TBEC: DELAYED_RELEASE_TABLET | ORAL | 1 refills | Status: DC

## 2021-01-13 IMAGING — DX DG CHEST 1V
1 series · 1 of 1 positions shown · non-contrast
Comparison: August 17, 2018

CLINICAL DATA: Shortness of breath

EXAM:
CHEST  1 VIEW

[chest]
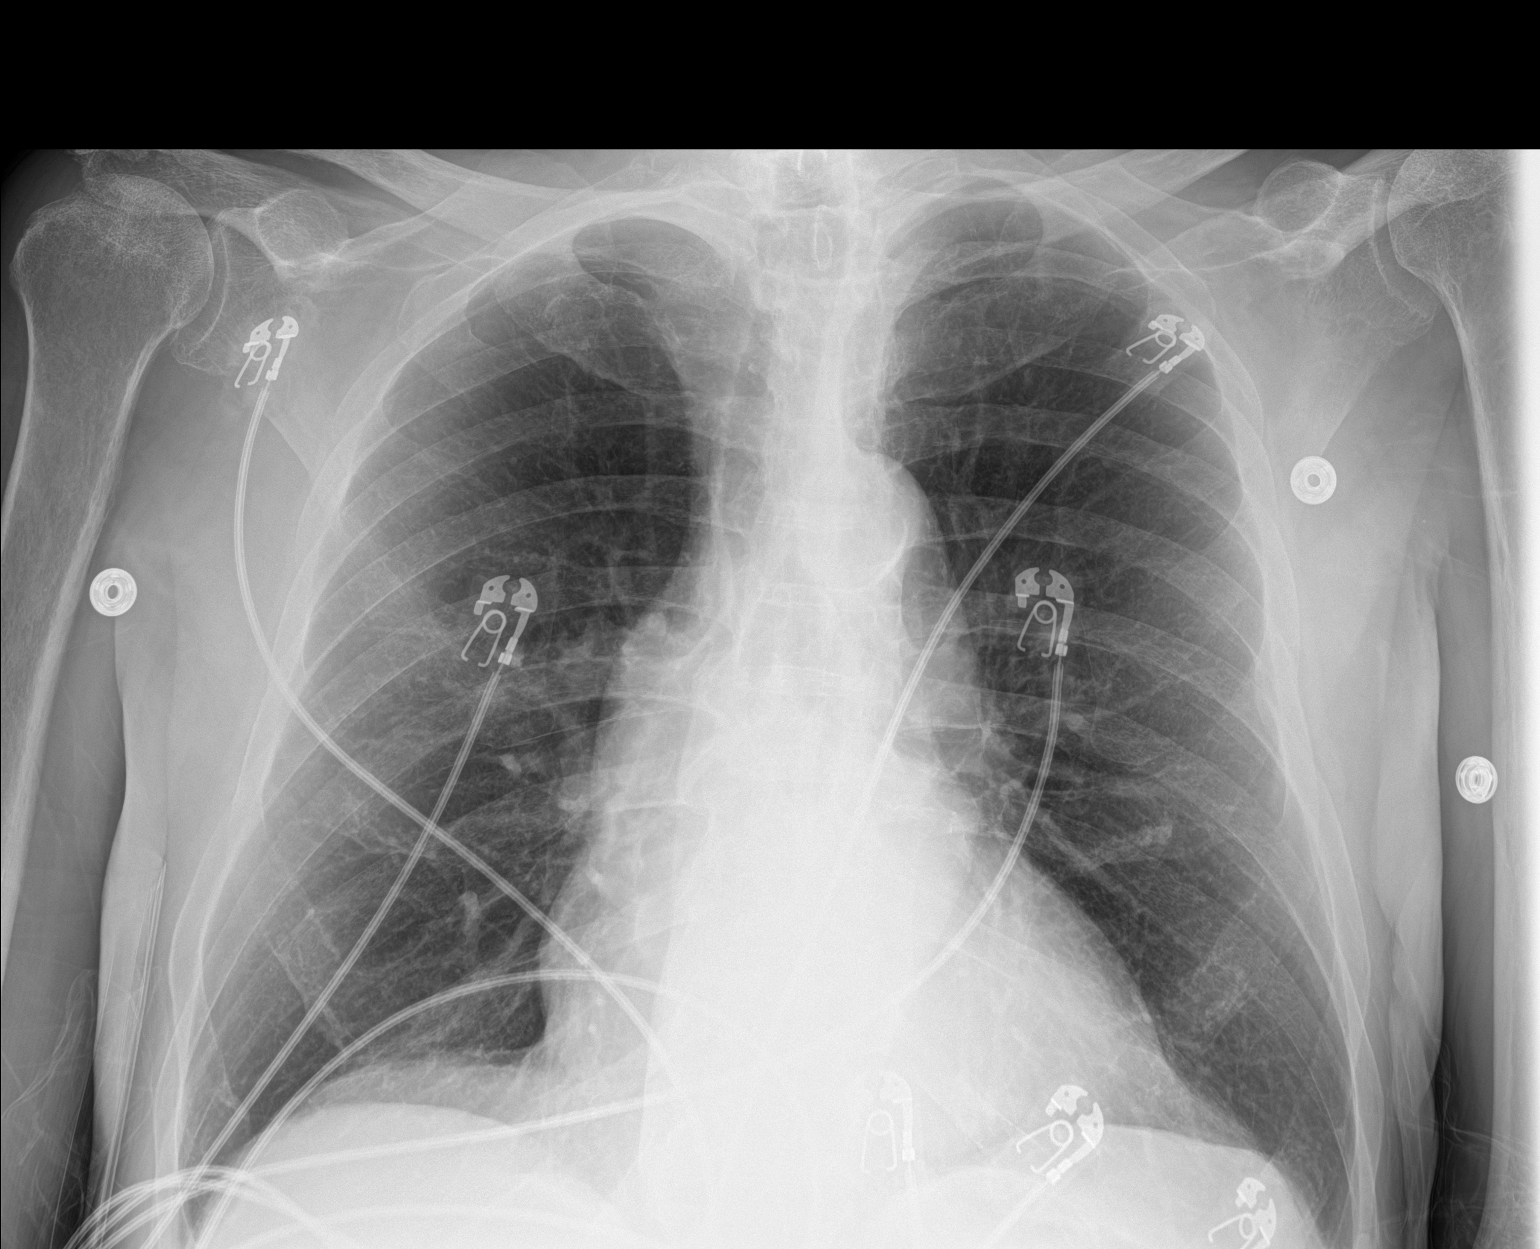

[1 of 1 positions shown; findings below may reference images not displayed]

FINDINGS: There is no edema or consolidation. The heart size and pulmonary
vascularity are within normal limits. No adenopathy. There is aortic
atherosclerosis. There is degenerative change in each shoulder.
IMPRESSION: No edema or consolidation. Stable cardiac silhouette. Aortic
Atherosclerosis ([id number]).

## 2021-02-21 ENCOUNTER — Other Ambulatory Visit: Payer: Self-pay | Admitting: Nephrology

## 2021-02-21 DIAGNOSIS — R319 Hematuria, unspecified: Secondary | ICD-10-CM

## 2021-02-25 ENCOUNTER — Ambulatory Visit
Admission: RE | Admit: 2021-02-25 | Discharge: 2021-02-25 | Disposition: A | Discharge status: Home / Self Care | Payer: 59 | Source: Ambulatory Visit | Attending: Nephrology | Admitting: Nephrology

## 2021-02-25 DIAGNOSIS — R319 Hematuria, unspecified: Secondary | ICD-10-CM

## 2021-05-08 ENCOUNTER — Encounter: Payer: Self-pay | Admitting: Family Medicine

## 2021-05-08 ENCOUNTER — Other Ambulatory Visit: Payer: Self-pay

## 2021-05-08 ENCOUNTER — Ambulatory Visit (INDEPENDENT_AMBULATORY_CARE_PROVIDER_SITE_OTHER): Payer: 59 | Admitting: Family Medicine

## 2021-05-08 VITALS — BP 150/84 | HR 101 | Temp 98.5°F | Wt 137.0 lb

## 2021-05-08 DIAGNOSIS — I10 Essential (primary) hypertension: Secondary | ICD-10-CM

## 2021-05-08 DIAGNOSIS — E785 Hyperlipidemia, unspecified: Secondary | ICD-10-CM | POA: Diagnosis not present

## 2021-05-08 DIAGNOSIS — Z0000 Encounter for general adult medical examination without abnormal findings: Secondary | ICD-10-CM | POA: Diagnosis not present

## 2021-05-08 DIAGNOSIS — R634 Abnormal weight loss: Secondary | ICD-10-CM | POA: Diagnosis not present

## 2021-05-08 DIAGNOSIS — N1832 Chronic kidney disease, stage 3b: Secondary | ICD-10-CM

## 2021-05-08 DIAGNOSIS — K828 Other specified diseases of gallbladder: Secondary | ICD-10-CM

## 2021-05-08 DIAGNOSIS — D649 Anemia, unspecified: Secondary | ICD-10-CM

## 2021-05-08 DIAGNOSIS — K8689 Other specified diseases of pancreas: Secondary | ICD-10-CM

## 2021-05-08 DIAGNOSIS — Z1159 Encounter for screening for other viral diseases: Secondary | ICD-10-CM

## 2021-05-08 DIAGNOSIS — F17200 Nicotine dependence, unspecified, uncomplicated: Secondary | ICD-10-CM

## 2021-05-08 DIAGNOSIS — I82403 Acute embolism and thrombosis of unspecified deep veins of lower extremity, bilateral: Secondary | ICD-10-CM

## 2021-05-08 MED ORDER — INDAPAMIDE 2.5 MG PO TABS: 2.5000 mg | ORAL_TABLET | Freq: Every day | ORAL | 3 refills | Status: DC

## 2021-05-08 NOTE — Patient Instructions (Addendum)
Blood pressure: We [place] increase your blood pressure medicine to Indapamide 2.5mg  daily. You make take two of your tablets until you run out of your current prescription. I have called in a new prescription to your pharmacy.  Please check your blood pressures daily and call me if you have Blood pressure readings <90/70.  Please follow up in 1 month for blood pressure check.   For your weight loss, I am getting lab work today to evaluate. As we discussed, I encouraged you to get lung cancer screening (CT scan), colonoscopy, and repeat MRI to monitor your pancreatic mass. You have opted not to obtain these at this time. We [place] continue to discuss this at your next visit. Please follow up in 1 month to monitor your weight and discuss goals of care. Please be sure to follow up with your urologist to monitor your prostate.

## 2021-05-08 NOTE — Progress Notes (Addendum)
Subjective:   Patient ID: Craig Moore    DOB: [birth year], 80 y.o. male   MRN: [id number]  Craig Moore is a 80 y.o. male with a history of CAD, CHF, DVT, COPD, h/o upper GI bleed 2/2 ulcer, gallbladder disease/mass, osteoarthritis, bilateral renal cysts, abnormal CT of abdomen, anemia, HLD, ED, CKD IIIa, h/o hyperkalemia, HTN, hyponatremia, pancreatic mass, tobacco use disorder, umbilical hernia here for annual exam.  Acute Concerns: Losing weight: notes poor appetite. Just doesn't desire larger meals. Notes increased satiety. Denies abdominal pain, difficulty eating, fevers, chills, diarrhea. Occasional constipation but bowel movement daily. Every day smoker: 2-3 cigs/day. History of smoking 1/2 PPD x 30 years. Denies hemoptysis, bone pain, night sweats. Denies depression. Denies CP or SOB.  Has never had colonoscopy. Had endoscopy in 2020.  Never had lung cancer screening.  Notes he sees a urologist. Has been having urinary symptoms where he feels like he has to go after he just urinates. Just recently had cystoscopy. Unsure if prostate has been evaluated. Has lost 8lbs over last year.   Social History: Alcohol: 2-3 8oz of wine (6-8% alcohol) per day.  Tobacco:  Every day smoker: 2-3 cigs/day. History of smoking 1/2 PPD x 30 years. Illicit Drugs: none Safe at home: yes Depression/Suicidality: no Exercise: walks 10-15 mins daily, works out in the yard  Health Maintenance: Health Maintenance Due  Topic  . TETANUS/TDAP   . Zoster Vaccines- Shingrix (1 of 2)  . Pneumococcal Vaccine 46-24 Years old (1 of 2 - PPSV23)  . COVID-19 Vaccine (3 - Booster for [employer] series)   Review of Systems:  Per HPI.   Objective:   BP (!) 150/84   Pulse (!) 101   Temp 98.5 F (36.9 C) (Oral)   Wt 137 lb (62.1 kg)   SpO2 97%   BMI 19.66 kg/m  Repeat BP: 164/75, Repeat BP 184/82 Vitals and nursing note reviewed.  General: pleasant thin older male, sitting comfortably in exam chair, well  nourished, well developed, in no acute distress with non-toxic appearance CV: regular rate and rhythm without murmurs, rubs, or gallops, no lower extremity edema, 2+ radial and pedal pulses bilaterally Lungs: clear to auscultation bilaterally with normal work of breathing on room air Resp: breathing comfortably on room air, speaking in full sentences Abdomen: soft, non-tender, non-distended, no masses or organomegaly palpable, normoactive bowel sounds Skin: warm, dry, no rashes or lesions Extremities: warm and well perfused, normal tone MSK: ROM grossly intact, strength intact, gait normal Neuro: Alert and oriented, speech normal  Assessment & Plan:   Annual Physical Exam: Patient here today for annual physical exam.  PMH, surgical history, and social history were reviewed. The following concerns below were discussed.   HTN (hypertension) Chronic, uncontrolled. Elevated on multiple blood pressure readings throughout exam.  - Increase Indapamide to 2.5mg  QD - monitor BP at home and follow up if persistently <100/70 - follow up 1 month for BP check  Weight loss Chronic. Slow decline with 8lb weight loss over last year. Endorses poor appetite.Suspect this may be age related however patient does have many risk factors that place him at higher risk for alternative cause. Denies B symptoms. He is an every day smoker and has never had lung cancer screening. He has never had a colonoscopy. Does follow up with a urologist thus likely has gotten PSA evaluated recently. He has had imaging notable for pancreatic mass that needed follow up. Discussed all of these with patient however patient persistently declined further  imaging at this time. Discussed risks of declining this work up and he voiced understanding.  - CBC with diff, CMP, TSH, LDH - [name] request urology records - Recommended referral to GI for colonoscopy, low dose CT chest for lung cancer screening, and repeat MRI abdomen/pelvis for  pancreatic mass follow up. Patient opted to think it over with plan to further discuss in 1 month regarding further work up.  - Likely benefit from [place] discussion given age and risk/benefits of extensive work up and plan to intervene if positive finding. Based on discussion, he is reluctant to do extensive work up.  - follow up 1 month for weight check for continued monitoring  Tobacco use disorder Chronic. Current every day smoker of 2-3 cigs/day. History of smoking 1/2 PPD x 30 years. Asymptomatic. Encouraged smoking cessation. Denies readiness to quit.  Pancreatic mass Discussed prior MRI results and recommendation for repeat MRI in 1 year. Although strongly encouraged, patient declined at this time. He was open to rediscussing at follow up visit in 1 month.  Dyslipidemia Chronic.  - continue Atrovastatin 20mg  QD - lipid panel  Anemia Resolved. CBC to monitor - Hgb 14 today.  CKD (chronic kidney disease) stage 3, GFR 30-59 ml/min (HCC) Chronic. Kidney function at baseline. Borderline CKD IIIb with GFR of 44. Can consider referral to nephrology If continues to decline. Avoid nephrotoxic agents.  Gallbladder mass Chronic. 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  - Elih continue to discuss repeat MRI to monitor  DVT (deep venous thrombosis) Chronic. Currently on Xarelto.  - continue  Orders Placed This Encounter  Procedures  . Hepatitis C antibody  . Lipid Panel  . Lactate Dehydrogenase  . TSH  . CBC With Differential  . Comprehensive metabolic panel   Meds ordered this encounter  Medications  . indapamide (LOZOL) 2.5 MG tablet    Sig: Take 1 tablet (2.5 mg total) by mouth daily.    Dispense:  90 tablet    Refill:  [place], DO PGY-3, [facility] 05/10/2021 11:37 PM

## 2021-05-09 LAB — CBC WITH DIFFERENTIAL
Basophils Absolute: 0 10*3/uL (ref 0.0–0.2)
Basos: 0 %
EOS (ABSOLUTE): 0.1 10*3/uL (ref 0.0–0.4)
Eos: 2 %
Hematocrit: 43.2 % (ref 37.5–51.0)
Hemoglobin: 14.8 g/dL (ref 13.0–17.7)
Immature Grans (Abs): 0 10*3/uL (ref 0.0–0.1)
Immature Granulocytes: 0 %
Lymphocytes Absolute: 1 10*3/uL (ref 0.7–3.1)
Lymphs: 23 %
MCH: 35.2 pg — ABNORMAL HIGH (ref 26.6–33.0)
MCHC: 34.3 g/dL (ref 31.5–35.7)
MCV: 103 fL — ABNORMAL HIGH (ref 79–97)
Monocytes Absolute: 0.4 10*3/uL (ref 0.1–0.9)
Monocytes: 8 %
Neutrophils Absolute: 2.9 10*3/uL (ref 1.4–7.0)
Neutrophils: 67 %
RBC: 4.2 x10E6/uL (ref 4.14–5.80)
RDW: 11.9 % (ref 11.6–15.4)
WBC: 4.5 10*3/uL (ref 3.4–10.8)

## 2021-05-09 LAB — COMPREHENSIVE METABOLIC PANEL
ALT: 11 IU/L (ref 0–44)
AST: 21 IU/L (ref 0–40)
Albumin/Globulin Ratio: 1.2 (ref 1.2–2.2)
Albumin: 4.1 g/dL (ref 3.7–4.7)
Alkaline Phosphatase: 127 IU/L — ABNORMAL HIGH (ref 44–121)
BUN/Creatinine Ratio: 8 — ABNORMAL LOW (ref 10–24)
BUN: 12 mg/dL (ref 8–27)
Bilirubin Total: 0.9 mg/dL (ref 0.0–1.2)
CO2: 22 mmol/L (ref 20–29)
Calcium: 9 mg/dL (ref 8.6–10.2)
Chloride: 97 mmol/L (ref 96–106)
Creatinine, Ser: 1.59 mg/dL — ABNORMAL HIGH (ref 0.76–1.27)
Globulin, Total: 3.3 g/dL (ref 1.5–4.5)
Glucose: 99 mg/dL (ref 65–99)
Potassium: 3.9 mmol/L (ref 3.5–5.2)
Sodium: 138 mmol/L (ref 134–144)
Total Protein: 7.4 g/dL (ref 6.0–8.5)
eGFR: 44 mL/min/{1.73_m2} — ABNORMAL LOW (ref >59)

## 2021-05-09 LAB — LIPID PANEL
Chol/HDL Ratio: 1.7 ratio (ref 0.0–5.0)
Cholesterol, Total: 158 mg/dL (ref 100–199)
HDL: 91 mg/dL (ref >39)
LDL Chol Calc (NIH): 55 mg/dL (ref 0–99)
Triglycerides: 61 mg/dL (ref 0–149)
VLDL Cholesterol Cal: 12 mg/dL (ref 5–40)

## 2021-05-09 LAB — LACTATE DEHYDROGENASE: LDH: 187 IU/L (ref 121–224)

## 2021-05-09 LAB — TSH: TSH: 1.02 u[IU]/mL (ref 0.450–4.500)

## 2021-05-09 LAB — HEPATITIS C ANTIBODY: Hep C Virus Ab: <0.1 s/co ratio (ref 0.0–0.9)

## 2021-05-10 DIAGNOSIS — R634 Abnormal weight loss: Secondary | ICD-10-CM | POA: Insufficient documentation

## 2021-05-10 NOTE — Assessment & Plan Note (Signed)
Chronic. Current every day smoker of 2-3 cigs/day. History of smoking 1/2 PPD x 30 years. Asymptomatic. Encouraged smoking cessation. Denies readiness to quit.

## 2021-05-10 NOTE — Assessment & Plan Note (Signed)
Resolved. CBC to monitor - Hgb 14 today.

## 2021-05-10 NOTE — Assessment & Plan Note (Signed)
Chronic. Kidney function at baseline. Borderline CKD IIIb with GFR of 44. Can consider referral to nephrology If continues to decline. Avoid nephrotoxic agents.

## 2021-05-10 NOTE — Assessment & Plan Note (Addendum)
Chronic. Slow decline with 8lb weight loss over last year. Endorses poor appetite.Suspect this may be age related however patient does have many risk factors that place him at higher risk for alternative cause. Denies B symptoms. He is an every day smoker and has never had lung cancer screening. He has never had a colonoscopy. Does follow up with a urologist thus likely has gotten PSA evaluated recently. He has had imaging notable for pancreatic mass that needed follow up. Discussed all of these with patient however patient persistently declined further imaging at this time. Discussed risks of declining this work up and he voiced understanding.  - CBC with diff, CMP, TSH, LDH - Andie request urology records - Recommended referral to GI for colonoscopy, low dose CT chest for lung cancer screening, and repeat MRI abdomen/pelvis for pancreatic mass follow up. Patient opted to think it over with plan to further discuss in 1 month regarding further work up.  - Likely benefit from [place] discussion given age and risk/benefits of extensive work up and plan to intervene if positive finding. Based on discussion, he is reluctant to do extensive work up.  - follow up 1 month for weight check for continued monitoring

## 2021-05-10 NOTE — Assessment & Plan Note (Signed)
Chronic. 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  - Clair continue to discuss repeat MRI to monitor

## 2021-05-10 NOTE — Assessment & Plan Note (Signed)
Chronic. Currently on Xarelto.  - continue

## 2021-05-10 NOTE — Assessment & Plan Note (Signed)
Chronic.  - continue Atrovastatin 20mg  QD - lipid panel

## 2021-05-10 NOTE — Assessment & Plan Note (Signed)
Chronic, uncontrolled. Elevated on multiple blood pressure readings throughout exam.  - Increase Indapamide to 2.5mg  QD - monitor BP at home and follow up if persistently <100/70 - follow up 1 month for BP check

## 2021-05-10 NOTE — Assessment & Plan Note (Signed)
Discussed prior MRI results and recommendation for repeat MRI in 1 year. Although strongly encouraged, patient declined at this time. He was open to rediscussing at follow up visit in 1 month.

## 2021-05-12 ENCOUNTER — Encounter: Payer: Self-pay | Admitting: Family Medicine

## 2021-05-13 ENCOUNTER — Other Ambulatory Visit: Payer: Self-pay | Admitting: Urology

## 2021-05-13 ENCOUNTER — Telehealth: Payer: Self-pay | Admitting: Cardiology

## 2021-05-13 NOTE — Telephone Encounter (Signed)
   [facility] Pre-operative Risk Assessment    Request for surgical clearance:  1. What type of surgery is being performed? Bladder tumor removal  2. When is this surgery scheduled? TBD   3. What type of clearance is required (medical clearance vs. Pharmacy clearance to hold med vs. Both)? both  4. Are there any medications that need to be held prior to surgery and how long? rivaroxaban (XARELTO) 20 MG TABS tablet 72 hrs prior to procedure  5. Practice name and name of physician performing surgery? Dr. Harold Barban  6. What is your office phone number [phone]   7.   What is your office fax number [phone]  8.   Anesthesia type (None, local, MAC, general) ? General   Milbert Coulter 05/13/2021, 11:54 AM  _________________________________________________________________   (provider comments below)

## 2021-05-13 NOTE — Telephone Encounter (Signed)
You can put the pt to see Dr. Johney Frame at our [place] location for 6/29 at any open slot, which there are a couple.  Other than that she is completely booked up, and pt Craig Moore need to see APP instead.  Thanks!

## 2021-05-13 NOTE — Telephone Encounter (Signed)
Pt was a pt of Dr. Francesca Oman which is scheduled to see Dr. Johney Frame for the 1st time 06/25/2021.  Pt is needed a bladder tumor removed and didn't want to wait until then.  Hyland route to Dr. Johney Frame and her LPN, Karlene Einstein, to see if pt can be worked in sooner.

## 2021-05-13 NOTE — Telephone Encounter (Signed)
   Name: Craig Moore  DOB: Jun 20, 1941  MRN: [id number]  Primary Cardiologist: Ena Dawley, MD (Inactive)  Chart reviewed as part of pre-operative protocol coverage. Because of [place] past medical history and time since last visit, he Zayon require a follow-up visit in order to better assess preoperative cardiovascular risk.  Pre-op covering staff: - Please schedule appointment and call patient to inform them. If patient already had an upcoming appointment within acceptable timeframe, please add "pre-op clearance" to the appointment notes so provider is aware. - Please contact requesting surgeon's office via preferred method (i.e, phone, fax) to inform them of need for appointment prior to surgery.  If applicable, this message Arlo also be routed to pharmacy pool and/or primary cardiologist for input on holding anticoagulant/antiplatelet agent as requested below so that this information is available to the clearing provider at time of patient's appointment.   Kathyrn Drown, NP  05/13/2021, 12:07 PM

## 2021-05-13 NOTE — Telephone Encounter (Signed)
Pt is scheduled to see Dr. Johney Frame at the [facility] location, 06/04/2021 at 11:40.  Pt is aware and Iris route back to the requesting surgeon's office to make them aware.

## 2021-05-19 ENCOUNTER — Other Ambulatory Visit: Payer: Self-pay | Admitting: *Deleted

## 2021-05-19 MED ORDER — ATORVASTATIN CALCIUM 20 MG PO TABS: 20.0000 mg | ORAL_TABLET | Freq: Every day | ORAL | 3 refills | Status: DC

## 2021-05-20 ENCOUNTER — Other Ambulatory Visit: Payer: Self-pay | Admitting: Family Medicine

## 2021-05-21 ENCOUNTER — Encounter: Payer: Self-pay | Admitting: Family Medicine

## 2021-05-21 DIAGNOSIS — D494 Neoplasm of unspecified behavior of bladder: Secondary | ICD-10-CM | POA: Insufficient documentation

## 2021-06-03 NOTE — Progress Notes (Signed)
Cardiology Office Note:    Date:  06/04/2021   ID:  Craig Moore, DOB [birth year], MRN [id number]  PCP:  Craig Hefty, DO   [facility] Providers Cardiologist:  Ena Dawley, MD (Inactive) {  Referring MD: Craig Hefty, DO    History of Present Illness:    Craig Moore is a 80 y.o. 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 Meda Moore who now presents to clinic for pre-operative evaluation for bladder tumor resection.  Last saw Dr. Meda Moore on 10/2019 where he was doing well. No anginal symptoms. 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 He returns today for pre-operative evaluation prior to bladder tumor removal.  Today, the patient overall feels well. No chest pain or SOB. Has occasional dyspnea on exertion which is unchanged. The patient is active and is able to walk a flight of stairs and walk a half a mile without anginal symptoms. He is able to garden in his yard for 3min-1hour without issues. No LE edema, orthopnea, PND, lightheadedness, dizziness or syncope. No hematuria currently. Tolerating xarelto without issues. Blood pressures mainly 120-130s at home.    Past Medical History:  Diagnosis Date   Bilateral renal cysts 01/05/2020   CAD (coronary artery disease)    Non-STEMI May, 2006, bare-metal stent  //   nuclear, January, 2012, EF 70%, no ischemia   CHF (congestive heart failure) (Craig Moore)    CKD (chronic kidney disease), stage III ([place])    DVT (deep venous thrombosis) (Craig Moore)    December, 2011, Coumadin therapy   Dyspnea    Ejection fraction    EF 55%, echo, October, 2006, technically difficult  //  EF 70%, nuclear, January, 2012   Groin pain    June, 2013   Hyperlipidemia    Hypertension    Myocardial infarction [facility])    Pre-syncope    ER, January, 2011, dehydration   Tobacco abuse     Past Surgical History:   Procedure Laterality Date   BIOPSY  11/28/2019   Procedure: BIOPSY;  Surgeon: Craig Essex, MD;  Location: [facility];  Service: Endoscopy;;   CORONARY ANGIOPLASTY  2006   ESOPHAGOGASTRODUODENOSCOPY (EGD) WITH PROPOFOL N/A 11/28/2019   Procedure: ESOPHAGOGASTRODUODENOSCOPY (EGD) WITH PROPOFOL;  Surgeon: Craig Essex, MD;  Location: [facility];  Service: Endoscopy;  Laterality: N/A;   EYE SURGERY      Current Medications: Current Meds  Medication Sig   acetaminophen (TYLENOL) 500 MG tablet Take 1,000 mg by mouth every 6 (six) hours as needed for mild pain or headache.   albuterol (VENTOLIN HFA) 108 (90 Base) MCG/ACT inhaler TAKE 2 PUFFS BY MOUTH EVERY 6 HOURS AS NEEDED FOR WHEEZE OR SHORTNESS OF BREATH   atorvastatin (LIPITOR) 20 MG tablet Take 1 tablet (20 mg total) by mouth daily at 6 PM.   b complex vitamins capsule Take 1 capsule by mouth daily.   folic acid (FOLVITE) 1 MG tablet Take 1 mg by mouth daily.   furosemide (LASIX) 20 MG tablet TAKE 1 TAB DAILY AS NEEDED FOR DOE, SOB, EDEMA, OR WEIGHT GAIN >3 POUNDS OVERNIGHT OR 5 POUNDS/WK. Please make overdue appt with Dr. Meda Moore. 1st attempt   indapamide (LOZOL) 2.5 MG tablet Take 1 tablet (2.5 mg total) by mouth daily.   pantoprazole (PROTONIX) 40 MG tablet Take once daily   sildenafil (VIAGRA)  100 MG tablet Take 0.5 tablets (50 mg total) by mouth daily as needed for erectile dysfunction.   XARELTO 20 MG TABS tablet TAKE 1 TABLET (20 MG TOTAL) BY MOUTH DAILY WITH SUPPER.     Allergies:   Patient has no known allergies.   Social History   Socioeconomic History   Marital status: Legally Separated    Spouse name: Not on file   Number of children: Not on file   Years of education: Not on file   Highest education level: Not on file  Occupational History   Not on file  Tobacco Use   Smoking status: Every Day    Packs/day: 0.25    Years: 55.00    Pack years: 13.75    Types: Cigarettes   Smokeless tobacco: Never  Vaping Use    Vaping Use: Never used  Substance and Sexual Activity   Alcohol use: Yes    Alcohol/week: 8.0 standard drinks    Types: 8 Glasses of wine per week    Comment: 2-3 glasses of wine a day   Drug use: No   Sexual activity: Not Currently  Other Topics Concern   Not on file  Social History Narrative   Not on file   Social Determinants of Health   Financial Resource Strain: Not on file  Food Insecurity: Not on file  Transportation Needs: Not on file  Physical Activity: Not on file  Stress: Not on file  Social Connections: Not on file     Family History: The patient's family history includes Breast cancer in his sister; Cancer in his mother; Diabetes in his father; Heart disease in his father; Stroke in his father.  ROS:   Please see the history of present illness.    Review of Systems  Constitutional:  Negative for chills and fever.  HENT:  Negative for sore throat.   Eyes:  Negative for blurred vision.  Respiratory:  Positive for shortness of breath.   Cardiovascular:  Negative for chest pain, palpitations, orthopnea, claudication, leg swelling and PND.  Gastrointestinal:  Negative for nausea and vomiting.  Genitourinary:  Negative for hematuria.  Musculoskeletal:  Negative for falls.  Neurological:  Negative for dizziness and loss of consciousness.    EKGs/Labs/Other Studies Reviewed:    The following studies were reviewed today: TTE 02-28-2018: Study Conclusions   - Left ventricle: The cavity size was normal. Wall thickness was    increased in a pattern of mild LVH. Indeterminant diastolic    function. Systolic function was normal. The estimated ejection    fraction was in the range of 60% to 65%. Wall motion was normal;    there were no regional wall motion abnormalities.  - Aortic valve: There was no stenosis.  - Aorta: Aortic root dimension: 37 mm (ED). Ascending aortic    diameter: 37 mm (S).  - Aortic root: The aortic root and ascending aorta were borderline     dilated.  - Mitral valve: There was trivial regurgitation.  - Right ventricle: The cavity size was normal. Systolic function    was normal.  - Tricuspid valve: Peak RV-RA gradient (S): 31 mm Hg.  - Pulmonary arteries: PA peak pressure: 34 mm Hg (S).  - Inferior vena cava: The vessel was normal in size. The    respirophasic diameter changes were in the normal range (>= 50%),    consistent with normal central venous pressure.  - Pericardium, extracardiac: A trivial pericardial effusion was    identified.  Impressions:   - Normal LV size with mild LV hypertrophy. EF 60-65%. Normal RV    size and systolic function. No significant valvular    abnormalities.  EKG:  EKG is  ordered today.  The ekg ordered today demonstrates sinus, mobitz I AVB, PVC and LAD  Recent Labs: 05/08/2021: ALT 11; BUN 12; Creatinine, Ser 1.59; Hemoglobin 14.8; Potassium 3.9; Sodium 138; TSH 1.020  Recent Lipid Panel    Component Value Date/Time   CHOL 158 05/08/2021 1424   TRIG 61 05/08/2021 1424   HDL 91 05/08/2021 1424   CHOLHDL 1.7 05/08/2021 1424   CHOLHDL 2.1 08/19/2015 1508   VLDL 22 08/19/2015 1508   LDLCALC 55 05/08/2021 1424   LDLDIRECT 67 01/13/2013 1527         Physical Exam:    VS:  BP (!) 148/90   Pulse 82   Ht 5\' 10"  (1.778 m)   Wt 139 lb (63 kg)   SpO2 96%   BMI 19.94 kg/m     Wt Readings from Last 3 Encounters:  06/04/21 139 lb (63 kg)  05/08/21 137 lb (62.1 kg)  05/09/20 145 lb 12.8 oz (66.1 kg)     GEN:  Thin, comfortable, NAD HEENT: Normal NECK: No JVD; No carotid bruits CARDIAC: RRR, no murmurs RESPIRATORY:  Diminished but clear  ABDOMEN: Soft, non-tender, non-distended MUSCULOSKELETAL:  No edema; No deformity  SKIN: Warm and dry NEUROLOGIC:  Alert and oriented x 3 PSYCHIATRIC:  Normal affect   ASSESSMENT:    1. Pre-operative clearance   2. CAD in native artery   3. Essential hypertension   4. Hypertension, well controlled   5. Mixed hyperlipidemia   6.  Coronary artery disease involving native coronary artery of native heart without angina pectoris   7. Stage 3a chronic kidney disease ([place])   8. 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 TTE 2019 with LVEF 60-65% with no WMA, no significant valve disease. He is active and able to complete >4METs without issues. Revised cardiac index 1 with 6% 30 day risk of MI, death or cardiac arrest. No stress testing indicated at this time. -No stress testing indicated -Continue lipitor 20mg  daily -Not on ASA or plavix given need for xarelto  #Recurrent DVTs on Xarelto: -Continue xarelto 20mg  daily -Okay to hold xarelto up to 3 days prior to procedure without bridging; resume as able following surgery  #HTN: -Continue idapamide 2.5mg  daily -Monitor at home with goal <120s/80s  #HLD: LDL controlled and at goal of <70. -Last LDL 55 on 05/08/21 -Continue lipitor 20mg  daily  #CKD Stage IIIA: Followed by neprhology. -Continue lasix 20mg  as needed -Continue idapamide 2.5mg  daily  Medication Adjustments/Labs and Tests Ordered: Current medicines are reviewed at length with the patient today.  Concerns regarding medicines are outlined above.  Orders Placed This Encounter  Procedures   EKG 12-Lead   No orders of the defined types were placed in this encounter.   Patient Instructions  Medication Instructions:   Your physician recommends that you continue on your current medications as directed. Please refer to the Current Medication list given to you today.  *If you need a refill on your cardiac medications before your next appointment, please call your pharmacy*   Follow-Up: At [facility], you and your  health needs are our priority.  As part of our continuing mission to provide you  with exceptional heart care, we have created designated Provider Care Teams.  These Care Teams include your primary Cardiologist (physician) and Advanced Practice Providers (APPs -  Physician Assistants and Nurse Practitioners) who all work together to provide you with the care you need, when you need it.  We recommend signing up for the patient portal called "MyChart".  Sign up information is provided on this After Visit Summary.  MyChart is used to connect with patients for Virtual Visits (Telemedicine).  Patients are able to view lab/test results, encounter notes, upcoming appointments, etc.  Non-urgent messages can be sent to your provider as well.   To learn more about what you can do with MyChart, go to [email or web address].    Your next appointment:   1 year(s)  The format for your next appointment:   In Person  Provider:   Gwyndolyn Kaufman, MD      Signed, Freada Bergeron, MD  06/04/2021 1:13 PM

## 2021-06-04 ENCOUNTER — Other Ambulatory Visit: Payer: Self-pay

## 2021-06-04 ENCOUNTER — Encounter (HOSPITAL_BASED_OUTPATIENT_CLINIC_OR_DEPARTMENT_OTHER): Payer: Self-pay | Admitting: Cardiology

## 2021-06-04 ENCOUNTER — Ambulatory Visit (INDEPENDENT_AMBULATORY_CARE_PROVIDER_SITE_OTHER): Payer: 59 | Admitting: Cardiology

## 2021-06-04 VITALS — BP 148/90 | HR 82 | Ht 70.0 in | Wt 139.0 lb

## 2021-06-04 DIAGNOSIS — Z01818 Encounter for other preprocedural examination: Secondary | ICD-10-CM

## 2021-06-04 DIAGNOSIS — E782 Mixed hyperlipidemia: Secondary | ICD-10-CM

## 2021-06-04 DIAGNOSIS — N1831 Chronic kidney disease, stage 3a: Secondary | ICD-10-CM

## 2021-06-04 DIAGNOSIS — I10 Essential (primary) hypertension: Secondary | ICD-10-CM

## 2021-06-04 DIAGNOSIS — I2510 Atherosclerotic heart disease of native coronary artery without angina pectoris: Secondary | ICD-10-CM | POA: Diagnosis not present

## 2021-06-04 DIAGNOSIS — I825Y3 Chronic embolism and thrombosis of unspecified deep veins of proximal lower extremity, bilateral: Secondary | ICD-10-CM

## 2021-06-04 NOTE — Telephone Encounter (Signed)
Okay to hold xarelto for 72 hours without bridging. Resume once able after surgery.  No stress testing or further CV work-up needed prior to surgery.

## 2021-06-04 NOTE — Telephone Encounter (Signed)
   Name: Craig Moore  DOB: 11-04-1941  MRN: [id number]   Primary Cardiologist: Ena Dawley, MD (Inactive)  Chart reviewed as part of pre-operative protocol coverage. Patient was contacted 06/04/2021 in reference to pre-operative risk assessment for pending surgery as outlined below.  Craig Moore was last seen on 06/04/21 by Dr. Johney Moore.    Per Dr. Johney Moore: [name] to hold xarelto for 72 hours without bridging. Resume once able after surgery.   No stress testing or further CV work-up needed prior to surgery  Therefore, based on ACC/AHA guidelines, the patient would be at acceptable risk for the planned procedure without further cardiovascular testing.   The patient was advised that if he develops new symptoms prior to surgery to contact our office to arrange for a follow-up visit, and he verbalized understanding.  I Craig Moore route this recommendation to the requesting party via Epic fax function and remove from pre-op pool. Please call with questions.  Craig Moore Craig Alcock, PA 06/04/2021, 4:53 PM

## 2021-06-04 NOTE — Patient Instructions (Signed)

## 2021-06-12 ENCOUNTER — Other Ambulatory Visit: Payer: Self-pay

## 2021-06-12 ENCOUNTER — Ambulatory Visit (INDEPENDENT_AMBULATORY_CARE_PROVIDER_SITE_OTHER): Payer: 59 | Admitting: Family Medicine

## 2021-06-12 ENCOUNTER — Encounter: Payer: Self-pay | Admitting: Family Medicine

## 2021-06-12 DIAGNOSIS — I10 Essential (primary) hypertension: Secondary | ICD-10-CM | POA: Diagnosis not present

## 2021-06-12 DIAGNOSIS — R634 Abnormal weight loss: Secondary | ICD-10-CM

## 2021-06-12 NOTE — Patient Instructions (Addendum)
It was good to see you today.  Thank you for coming in.  For your blood pressure I think we [place] continue on your current medications.  I [place] try to reach out to your Cardiologist   I would like you to follow-up in 1 month.  Continue to check your blood pressure at home.  If it is repeatedly >150 then come see [place].  Be Well, Dr Manus Rudd

## 2021-06-13 NOTE — Assessment & Plan Note (Signed)
Gained 2 pounds since visit one month ago.  Declines colonoscopy or further imaging/management for other masses outside of bladder. - follow up with Urologist for bladder mass removal

## 2021-06-13 NOTE — Progress Notes (Signed)
    SUBJECTIVE:   CHIEF COMPLAINT / HPI: HTN follow-up  Patient indicates has been checking blood pressure at home.  Indicates it has been running in 120's-140's.  Indicates it always seems to run higher in clinic.  At visit one month ago, had increase in blood pressure medication to 2.5 mg.  Had presyncopal episode after this intiial increase and checked BP at home and was 90/50s. Has had issue with multiple previous anti-hypertensive medications.  Takes lasix as needed once every month or 2 when feeling fluid build-up.  Patient recently saw Cardiology for Urology surgery for bladder tumor removal.  Declines colonoscopy or further imaging.  Weight increased 2 pounds since previous visit.  PERTINENT  PMH / PSH: HTN, CKD  OBJECTIVE:   BP (!) 142/78   Pulse 72   Ht 5\' 10"  (1.778 m)   Wt 139 lb 3.2 oz (63.1 kg)   BMI 19.97 kg/m    Physical Exam Constitutional:      General: He is not in acute distress.    Appearance: He is not ill-appearing.  HENT:     Head: Normocephalic and atraumatic.  Cardiovascular:     Rate and Rhythm: Normal rate and regular rhythm.  Pulmonary:     Effort: Pulmonary effort is normal.     Breath sounds: Normal breath sounds.  Skin:    General: Skin is warm.  Neurological:     Mental Status: He is alert.     ASSESSMENT/PLAN:   HTN (hypertension) Currently on Imipramine 2.5 mg daily.   - Daelin not increase further given relatively decent control of 120's-140's and issues with multiple other medications and previous hypotensive episodes with dose increase - Continue lasix only as needed given CKD - patient indicates following up in 1 month again too soon, but willing to folow-up in 3 months, can discuss further management with PCP then  Weight loss Gained 2 pounds since visit one month ago.  Declines colonoscopy or further imaging/management for other masses outside of bladder. - follow up with Urologist for bladder mass removal     Delora Fuel,  MD [employer]

## 2021-06-13 NOTE — Assessment & Plan Note (Addendum)
Currently on Indapamide 2.5 mg daily.   - Witt not increase further given relatively decent control of 120's-140's and issues with multiple other medications and previous hypotensive episodes with dose increase - Continue lasix only as needed given CKD - patient indicates following up in 1 month again too soon, but willing to folow-up in 3 months, can discuss further management with PCP then

## 2021-06-16 ENCOUNTER — Other Ambulatory Visit: Payer: Self-pay

## 2021-06-16 DIAGNOSIS — I10 Essential (primary) hypertension: Secondary | ICD-10-CM

## 2021-06-16 MED ORDER — INDAPAMIDE 2.5 MG PO TABS: 2.5000 mg | ORAL_TABLET | Freq: Every day | ORAL | 3 refills | Status: DC

## 2021-06-18 ENCOUNTER — Encounter (HOSPITAL_BASED_OUTPATIENT_CLINIC_OR_DEPARTMENT_OTHER): Payer: Self-pay | Admitting: Urology

## 2021-06-18 ENCOUNTER — Other Ambulatory Visit: Payer: Self-pay

## 2021-06-18 DIAGNOSIS — D494 Neoplasm of unspecified behavior of bladder: Secondary | ICD-10-CM

## 2021-06-18 DIAGNOSIS — K5900 Constipation, unspecified: Secondary | ICD-10-CM

## 2021-06-18 DIAGNOSIS — Z972 Presence of dental prosthetic device (complete) (partial): Secondary | ICD-10-CM

## 2021-06-18 HISTORY — DX: Constipation, unspecified: K59.00

## 2021-06-18 HISTORY — DX: Neoplasm of unspecified behavior of bladder: D49.4

## 2021-06-18 HISTORY — DX: Presence of dental prosthetic device (complete) (partial): Z97.2

## 2021-06-18 NOTE — Progress Notes (Addendum)
Spoke w/ via phone for pre-op interview---pt Lab needs dos----  I stat             Lab results------see below COVID test -----patient states asymptomatic no test needed Arrive at -------915 am 06-24-2021 NPO after MN NO Solid Food.  Clear liquids from MN until---815 am then npo Med rec completed Medications to take morning of surgery -----albuterol inhaler prn/bring inhaler, pantaprazole Diabetic medication -----n/a Patient instructed no nail polish to be worn day of surgery Patient instructed to bring photo id and insurance card day of surgery Patient aware to have Driver (ride ) / caregiver  hansel Natt brother   for 24 hours after surgery  Patient Special Instructions -----no smoking or wine 24 hours before surgery per anesthesia guidelines Pre-Op special Istructions -----none Patient verbalized understanding of instructions that were given at this phone interview. Patient denies shortness of breath, chest pain, fever, cough at this phone interview.   Anesthesia Review: hx of cad, htn, mi 2006 chf, ckd stage 3,   dvt x 2 chronic anticoagulation, pt denies cardiac S & S or sob at pre op call.  PCP: dr [facility]ula [facility] 06-12-2021 epic Cardiologist :cardiac clearance note/lov dr Gwyndolyn Kaufman 06-04-2021 epic Chest x-ray :1 view 11-27-2019 epic EKG :06-04-2021 epic Echo :08-17-2018 epic Stress test:12-31-2010 epic Cardiac Cath : none in epic Nephrology lov dr Hassell Done webb 04-09-2021 on chart Activity level: does own house and yard work, can climb flight of stairs without difficulty Sleep Study/ CPAP :none Blood Thinner/ Instructions /Last Dose: note to stop xarelto x 3 days before surgery dr Gwyndolyn Kaufman epic, pt aware last dose to be 06-20-2021

## 2021-06-23 NOTE — Progress Notes (Signed)
Surgery cancelled for 06-24-2021 due to patient took blood thinner

## 2021-06-25 ENCOUNTER — Ambulatory Visit: Payer: 59 | Admitting: Cardiology

## 2021-06-26 ENCOUNTER — Other Ambulatory Visit: Payer: Self-pay

## 2021-06-26 NOTE — Progress Notes (Signed)
Spoke w/ via phone for pre-op interview---pt Lab needs dos----  I stat             Lab results------see below COVID test -----patient states asymptomatic no test needed Arrive at -------1015 am 07-01-2021 NPO after MN NO Solid Food.  Clear liquids from MN until--915 am then npo Med rec completed Medications to take morning of surgery -----albuterol inhaler prn/bring inhaler, pantaprazole Diabetic medication -----n/a Patient instructed no nail polish to be worn day of surgery Patient instructed to bring photo id and insurance card day of surgery Patient aware to have Driver (ride ) / caregiver  hansel Vanlanen brother   for 24 hours after surgery  Patient Special Instructions -----no smoking or wine 24 hours before surgery per anesthesia guidelines Pre-Op special Istructions -----none Patient verbalized understanding of instructions that were given at this phone interview. Patient denies shortness of breath, chest pain, fever, cough at this phone interview.   Anesthesia Review: hx of cad, htn, mi 2006 chf, ckd stage 3,   dvt x 2 chronic anticoagulation, pt denies cardiac S & S or sob at pre op call.  PCP: dr [facility] 06-12-2021 epic Cardiologist :cardiac clearance note/lov dr Gwyndolyn Kaufman 06-04-2021 epic Chest x-ray :1 view 11-27-2019 epic EKG :06-04-2021 epic Echo :08-17-2018 epic Stress test:12-31-2010 epic Cardiac Cath : none in epic Nephrology lov dr Hassell Done webb 04-09-2021 on chart Activity level: does own house and yard work, can climb flight of stairs without difficulty Sleep Study/ CPAP :none Blood Thinner/ Instructions /Last Dose: note to stop xarelto x 3 days before surgery dr Gwyndolyn Kaufman epic, pt aware last dose to be 06-27-2021

## 2021-06-30 NOTE — H&P (Signed)
Patient is a 80 year old African American male followed by Nephrology with chronic renal insufficiency. Has had history of micro hematuria has had evaluation of upper urinary tract with MRI of the kidneys on 12/22/2019 showing a 2 and 0.5 cm right hemorrhagic renal cyst. This was followed up with recent renal ultrasound on 02/26/2021 showing bilateral simple renal cysts. Patient has noted some dark color to his urine but no frank gross hematuria by his report. Denies any dysuria. He does have significant smoking history of approximately 3 packs per week for some 40 years now goes down to 1 pack per per week. Denies any prior history of stones. Here for evaluation of persistent micro hematuria.  Micro urinalysis today shows 3-10 RBCs 1+ protein on dip.  -05/02/21-patient with history of chronic renal insufficiency and micro hematuria. Had MRI of the kidneys in January 2021 showing a hemorrhagic right renal cyst and follow-up ultrasound showing bilateral simple cysts as above. Here for cysto to assess bladder.  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 Patient also had bilobar prostatic hypertrophy with some hypervascularity of the prostate. Prostatic urethral length is 2 and half to 3 cm  -06/18/21-patient with history of chronic renal insufficiency and micro hematuria. Was found to have posterior bladder wall tumor on cystoscopy. He is on chronic anticoagulation has seen cardiology for preoperative consultation. They recommended we could stop Xarelto for 72 hours prior to the procedure. I discussed the procedure in detail today with associated risks and benefits as outlined below in the plan section. All questions answered today.     ALLERGIES: None   MEDICATIONS: Atorvastatin Calcium  0000000  Folic Acid  Indapamide 1.25 mg tablet  Pantoprazole Sodium  Xarelto     GU PSH: Cystoscopy - 05/02/2021     NON-GU PSH: No Non-GU PSH    GU PMH:  Bladder tumor/neoplasm - 05/02/2021 Microscopic hematuria - 05/02/2021, - 04/07/2021    NON-GU PMH: Arthritis GERD Hypertension Myocardial Infarction    FAMILY HISTORY: No Family History     Notes: parents deceased   SOCIAL HISTORY: Marital Status: Divorced Preferred Language: English; Ethnicity: Not Hispanic Or Latino; Race: Black or African American Current Smoking Status: Patient does not smoke anymore.   Tobacco Use Assessment Completed: Used Tobacco in last 30 days? Does not use smokeless tobacco. Social Drinker.  Does not use drugs. Drinks 2 caffeinated drinks per day. Has not had a blood transfusion.    REVIEW OF SYSTEMS:    GU Review Male:   Patient denies frequent urination, hard to postpone urination, burning/ pain with urination, get up at night to urinate, leakage of urine, stream starts and stops, trouble starting your stream, have to strain to urinate , erection problems, and penile pain.  Gastrointestinal (Upper):   Patient denies nausea, vomiting, and indigestion/ heartburn.  Gastrointestinal (Lower):   Patient denies diarrhea and constipation.  Constitutional:   Patient denies fever, night sweats, weight loss, and fatigue.  Skin:   Patient denies skin rash/ lesion and itching.  Eyes:   Patient denies blurred vision and double vision.  Ears/ Nose/ Throat:   Patient denies sore throat and sinus problems.  Hematologic/Lymphatic:   Patient denies swollen glands and easy bruising.  Cardiovascular:   Patient denies leg swelling and chest pains.  Respiratory:   Patient denies cough and shortness of breath.  Endocrine:   Patient denies excessive thirst.  Musculoskeletal:   Patient denies back pain and  joint pain.  Neurological:   Patient denies headaches and dizziness.  Psychologic:   Patient denies depression and anxiety.   VITAL SIGNS: None   MULTI-SYSTEM PHYSICAL EXAMINATION:    Constitutional: Well-nourished. No physical deformities. Normally developed. Good  grooming.  Neck: Neck symmetrical, not swollen. Normal tracheal position.  Respiratory: No labored breathing, no use of accessory muscles.   Cardiovascular: Normal temperature, normal extremity pulses, no swelling, no varicosities.  Lymphatic: No enlargement of neck, axillae, groin.  Skin: No paleness, no jaundice, no cyanosis. No lesion, no ulcer, no rash.  Neurologic / Psychiatric: Oriented to time, oriented to place, oriented to person. No depression, no anxiety, no agitation.  Eyes: Normal conjunctivae. Normal eyelids.  Ears, Nose, Mouth, and Throat: Left ear no scars, no lesions, no masses. Right ear no scars, no lesions, no masses. Nose no scars, no lesions, no masses. Normal hearing. Normal lips.  Musculoskeletal: Normal gait and station of head and neck.     Complexity of Data:  Source Of History:  Patient  Records Review:   Previous Doctor Records, Previous Hospital Records, Previous Patient Records  Urine Test Review:   Urinalysis   PROCEDURES:          Urinalysis w/Scope - 81001 Dipstick Dipstick Cont'd Micro  Color: Amber Bilirubin: Neg mg/dL WBC/hpf: NS (Not Seen)  Appearance: Clear Ketones: Neg mg/dL RBC/hpf: 0 - 2/hpf  Specific Gravity: 1.025 Blood: 1+ ery/uL Bacteria: NS (Not Seen)  pH: 5.5 Protein: 1+ mg/dL Cystals: NS (Not Seen)  Glucose: Neg mg/dL Urobilinogen: 1.0 mg/dL Casts: NS (Not Seen)    Nitrites: Neg Trichomonas: Not Present    Leukocyte Esterase: Neg leu/uL Mucous: Not Present      Epithelial Cells: NS (Not Seen)      Yeast: NS (Not Seen)      Sperm: Not Present    ASSESSMENT:      ICD-10 Details  1 GU:   Bladder tumor/neoplasm - 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 Acute, Complicated Injury  2   Microscopic hematuria - [id number] Acute, Complicated Injury   PLAN:           Document Letter(s):  Created for Patient: Clinical Summary         Notes:   TURBT consent: I have discussed with the patient the risks, benefits of TURBT which include but are not limited to: Bleeding,  infection, damage to the bladder with potential perforation of the bladder, damage to surrounding organs, possible need for further procedures including open repair and catheterization, possibility of nonhealing area within the bladder, urgency, frequency which may be refractory to medications. I pointed out that in some occasions after resection of the bladder tumor, mitomycin-C chemotherapy may be instilled into the bladder. The risks associated with this therapy include but are not limited to: Refractory or new onset urgency, frequency, dysuria, infrequently severe systemic side effects secondary to mitomycin-C. After full discussion of the risks, benefits and alternatives, the patient has consented to the above procedure and desires to proceed.

## 2021-06-30 NOTE — Anesthesia Preprocedure Evaluation (Addendum)
Anesthesia Evaluation  Patient identified by MRN, date of birth, ID band Patient awake    Reviewed: Allergy & Precautions, NPO status , Patient's Chart, lab work & pertinent test results  Airway Mallampati: II  TM Distance: >3 FB Neck ROM: Full    Dental no notable dental hx. (+) Edentulous Upper, Edentulous Lower   Pulmonary Current Smoker and Patient abstained from smoking.,    Pulmonary exam normal breath sounds clear to auscultation       Cardiovascular hypertension, Pt. on medications + CAD, + Past MI and + Cardiac Stents  Normal cardiovascular exam Rhythm:Regular Rate:Normal  08/2018 TTE - Normal LV size with mild LV hypertrophy. EF 60-65%. Normal RV  size and systolic function. No significant valvular  abnormalities.    Neuro/Psych negative neurological ROS     GI/Hepatic Neg liver ROS, PUD,   Endo/Other    Renal/GU Renal InsufficiencyRenal diseaseLab Results      Component                Value               Date                      CREATININE               1.59 (H)            05/08/2021                BUN                      12                  05/08/2021                NA                       138                 05/08/2021                K                        3.9                 05/08/2021                CL                       97                  05/08/2021                CO2                      22                  05/08/2021              Bladder Tumor    Musculoskeletal  (+) Arthritis ,   Abdominal   Peds  Hematology Lab Results      Component                Value               Date  WBC                      4.5                 05/08/2021                HGB                      14.8                05/08/2021                HCT                      43.2                05/08/2021                MCV                      103 (H)             05/08/2021                PLT                       240                 01/15/2020              Anesthesia Other Findings   Reproductive/Obstetrics                            Anesthesia Physical Anesthesia Plan  ASA: 3  Anesthesia Plan: General   Post-op Pain Management:    Induction:   PONV Risk Score and Plan: 2 and Treatment may vary due to age or medical condition and Ondansetron  Airway Management Planned: LMA  Additional Equipment: None  Intra-op Plan:   Post-operative Plan:   Informed Consent: I have reviewed the patients History and Physical, chart, labs and discussed the procedure including the risks, benefits and alternatives for the proposed anesthesia with the patient or authorized representative who has indicated his/her understanding and acceptance.     Dental advisory given  Plan Discussed with: CRNA  Anesthesia Plan Comments:        Anesthesia Quick Evaluation

## 2021-07-01 ENCOUNTER — Encounter (HOSPITAL_BASED_OUTPATIENT_CLINIC_OR_DEPARTMENT_OTHER): Payer: Self-pay | Admitting: Urology

## 2021-07-01 ENCOUNTER — Ambulatory Visit (HOSPITAL_BASED_OUTPATIENT_CLINIC_OR_DEPARTMENT_OTHER): Payer: 59 | Admitting: Anesthesiology

## 2021-07-01 ENCOUNTER — Other Ambulatory Visit: Payer: Self-pay

## 2021-07-01 ENCOUNTER — Ambulatory Visit (HOSPITAL_BASED_OUTPATIENT_CLINIC_OR_DEPARTMENT_OTHER)
Admission: RE | Admit: 2021-07-01 | Discharge: 2021-07-01 | Disposition: A | Discharge status: Home / Self Care | Payer: 59 | Attending: Urology | Admitting: Urology

## 2021-07-01 ENCOUNTER — Encounter (HOSPITAL_BASED_OUTPATIENT_CLINIC_OR_DEPARTMENT_OTHER)
Admission: RE | Disposition: A | Discharge status: Home / Self Care | Payer: Self-pay | Source: Home / Self Care | Attending: Urology

## 2021-07-01 DIAGNOSIS — D494 Neoplasm of unspecified behavior of bladder: Secondary | ICD-10-CM

## 2021-07-01 DIAGNOSIS — C678 Malignant neoplasm of overlapping sites of bladder: Secondary | ICD-10-CM | POA: Diagnosis not present

## 2021-07-01 DIAGNOSIS — Z79899 Other long term (current) drug therapy: Secondary | ICD-10-CM | POA: Insufficient documentation

## 2021-07-01 DIAGNOSIS — I509 Heart failure, unspecified: Secondary | ICD-10-CM | POA: Diagnosis not present

## 2021-07-01 DIAGNOSIS — C679 Malignant neoplasm of bladder, unspecified: Secondary | ICD-10-CM | POA: Diagnosis present

## 2021-07-01 DIAGNOSIS — N183 Chronic kidney disease, stage 3 unspecified: Secondary | ICD-10-CM | POA: Insufficient documentation

## 2021-07-01 DIAGNOSIS — I130 Hypertensive heart and chronic kidney disease with heart failure and stage 1 through stage 4 chronic kidney disease, or unspecified chronic kidney disease: Secondary | ICD-10-CM | POA: Insufficient documentation

## 2021-07-01 DIAGNOSIS — Z87891 Personal history of nicotine dependence: Secondary | ICD-10-CM | POA: Diagnosis not present

## 2021-07-01 DIAGNOSIS — Z7901 Long term (current) use of anticoagulants: Secondary | ICD-10-CM | POA: Insufficient documentation

## 2021-07-01 HISTORY — PX: TRANSURETHRAL RESECTION OF BLADDER TUMOR: SHX2575

## 2021-07-01 HISTORY — DX: Unspecified osteoarthritis, unspecified site: M19.90

## 2021-07-01 HISTORY — PX: CYSTOSCOPY W/ RETROGRADES: SHX1426

## 2021-07-01 LAB — POCT I-STAT, CHEM 8
BUN: 11 mg/dL (ref 8–23)
Calcium, Ion: 1.13 mmol/L — ABNORMAL LOW (ref 1.15–1.40)
Chloride: 97 mmol/L — ABNORMAL LOW (ref 98–111)
Creatinine, Ser: 1.5 mg/dL — ABNORMAL HIGH (ref 0.61–1.24)
Glucose, Bld: 100 mg/dL — ABNORMAL HIGH (ref 70–99)
HCT: 48 % (ref 39.0–52.0)
Hemoglobin: 16.3 g/dL (ref 13.0–17.0)
Potassium: 5.3 mmol/L — ABNORMAL HIGH (ref 3.5–5.1)
Sodium: 135 mmol/L (ref 135–145)
TCO2: 32 mmol/L (ref 22–32)

## 2021-07-01 SURGERY — TURBT (TRANSURETHRAL RESECTION OF BLADDER TUMOR)
Anesthesia: General | Site: Renal

## 2021-07-01 MED ORDER — PROPOFOL 10 MG/ML IV BOLUS
INTRAVENOUS | Status: AC
  Filled 2021-07-01: qty 20

## 2021-07-01 MED ORDER — STERILE WATER FOR IRRIGATION IR SOLN
Status: DC | PRN
  Administered 2021-07-01: 500 mL

## 2021-07-01 MED ORDER — DEXAMETHASONE SODIUM PHOSPHATE 4 MG/ML IJ SOLN
INTRAMUSCULAR | Status: DC | PRN
  Administered 2021-07-01: 4 mg via INTRAVENOUS

## 2021-07-01 MED ORDER — SODIUM CHLORIDE 0.9 % IV SOLN: INTRAVENOUS | Status: DC

## 2021-07-01 MED ORDER — STERILE WATER FOR IRRIGATION IR SOLN
Status: DC | PRN
  Administered 2021-07-01: 3000 mL

## 2021-07-01 MED ORDER — SODIUM CHLORIDE 0.9 % IV SOLN: 2.0000 g | INTRAVENOUS | Status: DC

## 2021-07-01 MED ORDER — TRAMADOL HCL 50 MG PO TABS
50.0000 mg | ORAL_TABLET | Freq: Once | ORAL | Status: AC
  Administered 2021-07-01: 50 mg via ORAL

## 2021-07-01 MED ORDER — LIDOCAINE 2% (20 MG/ML) 5 ML SYRINGE
INTRAMUSCULAR | Status: DC | PRN
  Administered 2021-07-01: 80 mg via INTRAVENOUS

## 2021-07-01 MED ORDER — FENTANYL CITRATE (PF) 100 MCG/2ML IJ SOLN
INTRAMUSCULAR | Status: AC
  Filled 2021-07-01: qty 2

## 2021-07-01 MED ORDER — TRAMADOL HCL 50 MG PO TABS
ORAL_TABLET | ORAL | Status: AC
  Filled 2021-07-01: qty 1

## 2021-07-01 MED ORDER — CEFAZOLIN SODIUM-DEXTROSE 2-3 GM-%(50ML) IV SOLR
INTRAVENOUS | Status: DC | PRN
  Administered 2021-07-01: 2 g via INTRAVENOUS

## 2021-07-01 MED ORDER — SODIUM CHLORIDE 0.9 % IV SOLN
INTRAVENOUS | Status: AC
  Filled 2021-07-01: qty 2

## 2021-07-01 MED ORDER — IOHEXOL 300 MG/ML  SOLN
INTRAMUSCULAR | Status: DC | PRN
  Administered 2021-07-01: 10 mL

## 2021-07-01 MED ORDER — ONDANSETRON HCL 4 MG/2ML IJ SOLN
INTRAMUSCULAR | Status: AC
  Filled 2021-07-01: qty 2

## 2021-07-01 MED ORDER — LIDOCAINE HCL (PF) 2 % IJ SOLN
INTRAMUSCULAR | Status: AC
  Filled 2021-07-01: qty 5

## 2021-07-01 MED ORDER — ONDANSETRON HCL 4 MG/2ML IJ SOLN
INTRAMUSCULAR | Status: DC | PRN
  Administered 2021-07-01: 4 mg via INTRAVENOUS

## 2021-07-01 MED ORDER — PROPOFOL 10 MG/ML IV BOLUS
INTRAVENOUS | Status: DC | PRN
  Administered 2021-07-01: 120 mg via INTRAVENOUS

## 2021-07-01 MED ORDER — FENTANYL CITRATE (PF) 100 MCG/2ML IJ SOLN
INTRAMUSCULAR | Status: DC | PRN
  Administered 2021-07-01: 50 ug via INTRAVENOUS
  Administered 2021-07-01 (×2): 25 ug via INTRAVENOUS

## 2021-07-01 SURGICAL SUPPLY — 39 items
BAG DRAIN URO-CYSTO SKYTR STRL (DRAIN) ×3 IMPLANT
BAG DRN RND TRDRP ANRFLXCHMBR (UROLOGICAL SUPPLIES)
BAG DRN UROCATH (DRAIN) ×2
BAG URINE DRAIN 2000ML AR STRL (UROLOGICAL SUPPLIES) IMPLANT
BAG URINE LEG 500ML (DRAIN) IMPLANT
BULB IRRIG PATHFIND (MISCELLANEOUS) IMPLANT
CATH FOLEY 2WAY SLVR  5CC 20FR (CATHETERS)
CATH FOLEY 2WAY SLVR  5CC 22FR (CATHETERS)
CATH FOLEY 2WAY SLVR 5CC 20FR (CATHETERS) IMPLANT
CATH FOLEY 2WAY SLVR 5CC 22FR (CATHETERS) IMPLANT
CATH URET 5FR 28IN CONE TIP (BALLOONS)
CATH URET 5FR 28IN OPEN ENDED (CATHETERS) ×3 IMPLANT
CATH URET 5FR 70CM CONE TIP (BALLOONS) IMPLANT
CLOTH BEACON ORANGE TIMEOUT ST (SAFETY) ×3 IMPLANT
ELECT REM PT RETURN 9FT ADLT (ELECTROSURGICAL) ×3
ELECTRODE REM PT RTRN 9FT ADLT (ELECTROSURGICAL) ×2 IMPLANT
EVACUATOR MICROVAS BLADDER (UROLOGICAL SUPPLIES) IMPLANT
FIBER LASER FLEXIVA 365 (UROLOGICAL SUPPLIES) IMPLANT
GLOVE SURG ENC MOIS LTX SZ6.5 (GLOVE) ×3 IMPLANT
GLOVE SURG ENC MOIS LTX SZ7.5 (GLOVE) ×3 IMPLANT
GLOVE SURG UNDER POLY LF SZ6.5 (GLOVE) ×6 IMPLANT
GOWN STRL REUS W/TWL LRG LVL3 (GOWN DISPOSABLE) ×6 IMPLANT
GUIDEWIRE ANG ZIPWIRE 038X150 (WIRE) IMPLANT
GUIDEWIRE STR DUAL SENSOR (WIRE) IMPLANT
HOLDER FOLEY CATH W/STRAP (MISCELLANEOUS) IMPLANT
IV NS IRRIG 3000ML ARTHROMATIC (IV SOLUTION) IMPLANT
KIT TURNOVER CYSTO (KITS) ×3 IMPLANT
LOOP MONOPOLAR YLW (ELECTROSURGICAL) IMPLANT
MANIFOLD NEPTUNE II (INSTRUMENTS) IMPLANT
PACK CYSTO (CUSTOM PROCEDURE TRAY) ×3 IMPLANT
PLUG CATH AND CAP STER (CATHETERS) IMPLANT
SYR 20ML LL LF (SYRINGE) ×3 IMPLANT
SYR TOOMEY IRRIG 70ML (MISCELLANEOUS)
SYRINGE TOOMEY IRRIG 70ML (MISCELLANEOUS) IMPLANT
TRACTIP FLEXIVA PULS ID 200XHI (Laser) IMPLANT
TRACTIP FLEXIVA PULSE ID 200 (Laser)
TUBE CONNECTING 12X1/4 (SUCTIONS) IMPLANT
WATER STERILE IRR 3000ML UROMA (IV SOLUTION) ×3 IMPLANT
WATER STERILE IRR 500ML POUR (IV SOLUTION) ×3 IMPLANT

## 2021-07-01 NOTE — Op Note (Signed)
Preoperative diagnosis:  1.  Bladder cancer  Postoperative diagnosis: 1.  Bladder cancer  Procedure(s): 1.  Cystoscopy, bilateral retrograde pyelogram with intraoperative interpretation, transurethral section of bladder tumor (medium)  Surgeon: Dr. Harold Barban  Anesthesia: General  Complications: None  EBL: Minimal  Specimens: Bladder tumor  Disposition of specimens: To pathology  Intraoperative findings: Approximate 2 to 3 cm very exophytic papillary tumor emanating from the right posterior lateral bladder wall with minimal bladder wall attachment.  Lesion was completely removed utilizing rigid biopsy forceps and base cauterized.  Bilateral retrogrades were normal  Indication: Patient is a 80 year old white male who has had history of hematuria and found to have abnormal finding on CT scan with exophytic lesion in the bladder.  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  Presents at this time undergo cystoscopy retrogrades and TURBT.  Description of procedure:  After obtaining informed consent for the patient is taken the major cystoscopy suite placed under general anesthesia.  He is placed in the dorsolithotomy position genitalia prepped and draped in usual sterile fashion.  Proper pause and timeout was performed.  56 Fransico was advanced in the bladder without difficulty.  Patient had mild bilobar prostatic hypertrophy and elevated bladder neck but otherwise normal.  Bladder was inspected with 30 and 70 degree lenses was noted to have 2 to 3 cm markedly exophytic bladder tumor emanating from the right posterior lateral bladder wall as per findings above.  The remainder the bladder appeared grossly normal with ureteral orifice ease in normal position and effluxing clear urine.  In order to preserve pathologic integrity is felt that this lesion would be best removed utilizing rigid biopsy forceps.  Utilizing 3-4  bites of the rigid biopsy forceps the lesion was completely removed.  The base of the lesion was very small attachment to the bladder wall and the final removal utilizing rigid biopsy forceps remove the lesion completely.  The base of the lesion was subsequently cauterized with the Bugbee electrode at the attachment site and an approximate 1-2cm rim around this area.  Good hemostasis was noted.  Attention was then directed towards the retrograde pyelograms.  Utilizing 5 French open tip catheter retrograde pyelogram was performed on both sides.  Both upper tracts filled out promptly without evidence of filling defect or hydronephrosis.  Both upper tracts also emptied after removal of the retrograde catheter.  Bladder was emptied and the area of resection was reinspected with good hemostasis.  Catheter was not deemed necessary.  The cystoscope was removed procedure terminated.  He was awakened from anesthesia and taken back to the recovery room in stable condition.  No immediate complication from the procedure.

## 2021-07-01 NOTE — Interval H&P Note (Signed)
History and Physical Interval Note:  07/01/2021 10:12 AM  Craig Moore  has presented today for surgery, with the diagnosis of BLADDER TUMOR.  The various methods of treatment have been discussed with the patient and family. After consideration of risks, benefits and other options for treatment, the patient has consented to  Procedure(s) with comments: TRANSURETHRAL RESECTION OF BLADDER TUMOR (TURBT) (N/A) - 30 MINS CYSTOSCOPY WITH RETROGRADE PYELOGRAM (Bilateral) as a surgical intervention.  The patient's history has been reviewed, patient examined, no change in status, stable for surgery.  I have reviewed the patient's chart and labs.  Questions were answered to the patient's satisfaction.     Remi Haggard

## 2021-07-01 NOTE — Transfer of Care (Signed)
Immediate Anesthesia Transfer of Care Note  Patient: Craig Moore  Procedure(s) Performed: TRANSURETHRAL RESECTION OF BLADDER TUMOR (TURBT) (Bladder) CYSTOSCOPY WITH RETROGRADE PYELOGRAM (Bilateral: Renal)  Patient Location: PACU  Anesthesia Type:General  Level of Consciousness: awake, alert  and oriented  Airway & Oxygen Therapy: Patient Spontanous Breathing and Patient connected to face mask oxygen  Post-op Assessment: Report given to RN and Post -op Vital signs reviewed and stable  Post vital signs: Reviewed and stable  Last Vitals:  Vitals Value Taken Time  BP 170/92 07/01/21 1150  Temp    Pulse 80 07/01/21 1152  Resp 16 07/01/21 1152  SpO2 99 % 07/01/21 1152  Vitals shown include unvalidated device data.  Last Pain:  Vitals:   07/01/21 1026  TempSrc: Oral  PainSc: 0-No pain      Patients Stated Pain Goal: 6 ([id number])  Complications: No notable events documented.

## 2021-07-01 NOTE — Anesthesia Procedure Notes (Signed)
Procedure Name: LMA Insertion Date/Time: 07/01/2021 11:21 AM Performed by: Maryella Shivers, CRNA Pre-anesthesia Checklist: Patient identified, Emergency Drugs available, Suction available and Patient being monitored Patient Re-evaluated:Patient Re-evaluated prior to induction Oxygen Delivery Method: Circle system utilized Preoxygenation: Pre-oxygenation with 100% oxygen Induction Type: IV induction Ventilation: Mask ventilation without difficulty LMA: LMA inserted LMA Size: 5.0 Number of attempts: 1 Airway Equipment and Method: Bite block Placement Confirmation: positive ETCO2 Tube secured with: Tape Dental Injury: Teeth and Oropharynx as per pre-operative assessment

## 2021-07-01 NOTE — Discharge Instructions (Signed)

## 2021-07-01 NOTE — Anesthesia Postprocedure Evaluation (Signed)
Anesthesia Post Note  Patient: Craig Moore  Procedure(s) Performed: TRANSURETHRAL RESECTION OF BLADDER TUMOR (TURBT) (Bladder) CYSTOSCOPY WITH RETROGRADE PYELOGRAM (Bilateral: Renal)     Patient location during evaluation: PACU Anesthesia Type: General Level of consciousness: awake and alert Pain management: pain level controlled Vital Signs Assessment: post-procedure vital signs reviewed and stable Respiratory status: spontaneous breathing, nonlabored ventilation, respiratory function stable and patient connected to nasal cannula oxygen Cardiovascular status: blood pressure returned to baseline and stable Postop Assessment: no apparent nausea or vomiting Anesthetic complications: no   No notable events documented.  Last Vitals:  Vitals:   07/01/21 1215 07/01/21 1410  BP: (!) 173/87 (!) 168/94  Pulse: 70 91  Resp: 15 18  Temp:    SpO2: 97% 98%    Last Pain:  Vitals:   07/01/21 1215  TempSrc:   PainSc: 0-No pain                 Barnet Glasgow

## 2021-07-02 ENCOUNTER — Encounter (HOSPITAL_BASED_OUTPATIENT_CLINIC_OR_DEPARTMENT_OTHER): Payer: Self-pay | Admitting: Urology

## 2021-07-02 LAB — SURGICAL PATHOLOGY

## 2021-07-15 ENCOUNTER — Encounter (HOSPITAL_COMMUNITY): Payer: Self-pay

## 2021-07-15 ENCOUNTER — Emergency Department (HOSPITAL_COMMUNITY)
Admission: EM | Admit: 2021-07-15 | Discharge: 2021-07-15 | Disposition: A | Discharge status: Home / Self Care | Payer: 59 | Attending: Emergency Medicine | Admitting: Emergency Medicine

## 2021-07-15 ENCOUNTER — Other Ambulatory Visit: Payer: Self-pay

## 2021-07-15 DIAGNOSIS — I130 Hypertensive heart and chronic kidney disease with heart failure and stage 1 through stage 4 chronic kidney disease, or unspecified chronic kidney disease: Secondary | ICD-10-CM | POA: Diagnosis not present

## 2021-07-15 DIAGNOSIS — I2510 Atherosclerotic heart disease of native coronary artery without angina pectoris: Secondary | ICD-10-CM | POA: Insufficient documentation

## 2021-07-15 DIAGNOSIS — J449 Chronic obstructive pulmonary disease, unspecified: Secondary | ICD-10-CM | POA: Insufficient documentation

## 2021-07-15 DIAGNOSIS — I509 Heart failure, unspecified: Secondary | ICD-10-CM | POA: Diagnosis not present

## 2021-07-15 DIAGNOSIS — I10 Essential (primary) hypertension: Secondary | ICD-10-CM

## 2021-07-15 DIAGNOSIS — F17210 Nicotine dependence, cigarettes, uncomplicated: Secondary | ICD-10-CM | POA: Insufficient documentation

## 2021-07-15 DIAGNOSIS — Z7901 Long term (current) use of anticoagulants: Secondary | ICD-10-CM | POA: Insufficient documentation

## 2021-07-15 DIAGNOSIS — R000 Tachycardia, unspecified: Secondary | ICD-10-CM | POA: Diagnosis not present

## 2021-07-15 DIAGNOSIS — N183 Chronic kidney disease, stage 3 unspecified: Secondary | ICD-10-CM | POA: Insufficient documentation

## 2021-07-15 DIAGNOSIS — R030 Elevated blood-pressure reading, without diagnosis of hypertension: Secondary | ICD-10-CM | POA: Diagnosis present

## 2021-07-15 LAB — TROPONIN I (HIGH SENSITIVITY)
Troponin I (High Sensitivity): 13 ng/L (ref <18)
Troponin I (High Sensitivity): 14 ng/L (ref <18)

## 2021-07-15 LAB — COMPREHENSIVE METABOLIC PANEL
ALT: 18 U/L (ref 0–44)
AST: 30 U/L (ref 15–41)
Albumin: 3.6 g/dL (ref 3.5–5.0)
Alkaline Phosphatase: 102 U/L (ref 38–126)
Anion gap: 10 (ref 5–15)
BUN: 9 mg/dL (ref 8–23)
CO2: 25 mmol/L (ref 22–32)
Calcium: 9 mg/dL (ref 8.9–10.3)
Chloride: 98 mmol/L (ref 98–111)
Creatinine, Ser: 1.57 mg/dL — ABNORMAL HIGH (ref 0.61–1.24)
GFR, Estimated: 45 mL/min — ABNORMAL LOW (ref >60)
Glucose, Bld: 96 mg/dL (ref 70–99)
Potassium: 3.9 mmol/L (ref 3.5–5.1)
Sodium: 133 mmol/L — ABNORMAL LOW (ref 135–145)
Total Bilirubin: 0.8 mg/dL (ref 0.3–1.2)
Total Protein: 7.2 g/dL (ref 6.5–8.1)

## 2021-07-15 LAB — CBC WITH DIFFERENTIAL/PLATELET
Abs Immature Granulocytes: 0.02 10*3/uL (ref 0.00–0.07)
Basophils Absolute: 0 10*3/uL (ref 0.0–0.1)
Basophils Relative: 1 %
Eosinophils Absolute: 0.3 10*3/uL (ref 0.0–0.5)
Eosinophils Relative: 4 %
HCT: 41.7 % (ref 39.0–52.0)
Hemoglobin: 14.5 g/dL (ref 13.0–17.0)
Immature Granulocytes: 0 %
Lymphocytes Relative: 24 %
Lymphs Abs: 1.5 10*3/uL (ref 0.7–4.0)
MCH: 36.3 pg — ABNORMAL HIGH (ref 26.0–34.0)
MCHC: 34.8 g/dL (ref 30.0–36.0)
MCV: 104.5 fL — ABNORMAL HIGH (ref 80.0–100.0)
Monocytes Absolute: 0.5 10*3/uL (ref 0.1–1.0)
Monocytes Relative: 9 %
Neutro Abs: 3.8 10*3/uL (ref 1.7–7.7)
Neutrophils Relative %: 62 %
Platelets: 239 10*3/uL (ref 150–400)
RBC: 3.99 MIL/uL — ABNORMAL LOW (ref 4.22–5.81)
RDW: 11.9 % (ref 11.5–15.5)
WBC: 6.2 10*3/uL (ref 4.0–10.5)
nRBC: 0 % (ref 0.0–0.2)

## 2021-07-15 MED ORDER — LABETALOL HCL 5 MG/ML IV SOLN
10.0000 mg | Freq: Once | INTRAVENOUS | Status: DC
  Filled 2021-07-15: qty 4

## 2021-07-15 NOTE — ED Triage Notes (Signed)
Pt reports getting blood pressure checked at [facility] and noted it to be 222/115. Denies CP/SOB. States that he took his BP medication at 1400 today. Pt reports poor BP control since recent change in medication.

## 2021-07-15 NOTE — ED Notes (Signed)
MD notified of decrease in pt blood pressure prior to medication administration.

## 2021-07-15 NOTE — ED Provider Notes (Signed)
[place] EMERGENCY DEPARTMENT Provider Note   CSN: [id number] Arrival date & time: 07/15/21  1753     History Chief Complaint  Patient presents with   Hypertension    Craig Moore is a 80 y.o. male with a PMHx of HTN, CAD, dyslipidemia and previously noted Mobitz I heart block who presents to the department today with elevated blood pressure. Patient reports he felt a slight tingling in his forehead that prompted him to check his blood pressure at [facility] and that it was noted to be 222/115 at the time. On arrival his BP was 209/112. Patient denies headache, chest pain, shortness of breath, difficulty speaking, dizziness, numbness, or continued tingling. Patient is taking indapamide 2.'5mg'$  qd and notes that his dosage was increased around 4 weeks ago. Patient reports he has been compliant with taking his medication. Patient has since been struggling with labile blood pressures but reports that this is the worst that it has been.    Hypertension Pertinent negatives include no chest pain, no headaches and no shortness of breath.      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   CAD (coronary artery disease)    Non-STEMI May, 2006, bare-metal stent  //   nuclear, January, 2012, EF 70%, no ischemia   CHF (congestive heart failure) ([place])    CKD (chronic kidney disease), stage III ([place])    see dr Hassell Done webb, ckd stage 3 lov dr webb 04-09-2021   Constipation 06/18/2021   DVT (deep venous thrombosis) ([place])    December, 2011 and 2016   Ejection fraction    EF 60-65% echo 08-17-2018   H/O: GI bleed 11/2019   History of blood transfusion 11/2019   1 unit given due to gi bleed   Hyperlipidemia    Hypertension    Myocardial infarction ([place]) 2006   Tobacco abuse    Wears dentures 06/18/2021   full dentures    Patient Active Problem List   Diagnosis Date Noted   Bladder tumor  05/21/2021   Weight loss 05/10/2021   Bilateral renal cysts 01/05/2020   Pancreatic mass 12/04/2019   Duodenal ulcer    H/O: upper GI bleed 11/27/2019   Abnormal CT of the abdomen 11/27/2019   CHF (congestive heart failure) ([place]) 08/17/2018   COPD (chronic obstructive pulmonary disease) ([place])    Gallbladder mass 11/12/2017   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   CKD (chronic kidney disease) stage 3, GFR 30-59 ml/min (H[facility]) 04/10/2016   Erectile dysfunction [id number]   Umbilical hernia [id number]   Osteoarthritis of both hips 06/01/2012   CAD (coronary artery disease)    DVT (deep venous thrombosis) ([place])    Dyslipidemia 02/23/2010   Tobacco use disorder 02/23/2010   HTN (hypertension) 02/21/2010    Past Surgical History:  Procedure Laterality Date   BIOPSY  11/28/2019   Procedure: BIOPSY;  Surgeon: Clarene Essex, MD;  Location: [place];  Service: Endoscopy;;   CORONARY ANGIOPLASTY  2006   CYSTOSCOPY W/ RETROGRADES Bilateral 07/01/2021   Procedure: CYSTOSCOPY WITH RETROGRADE PYELOGRAM;  Surgeon: Remi Haggard, MD;  Location[facility];  Service: Urology;  Laterality: Bilateral;   ESOPHAGOGASTRODUODENOSCOPY (EGD) WITH PROPOFOL N/A 11/28/2019   Procedure: ESOPHAGOGASTRODUODENOSCOPY (EGD) WITH PROPOFOL;  Surgeon: Clarene Essex, MD;  Location: [place];  Service: Endoscopy;  Laterality: N/A;   EYE SURGERY Right 2018   right eye cataract  MULTIPLE TOOTH EXTRACTIONS     all teeth pulled   TRANSURETHRAL RESECTION OF BLADDER TUMOR N/A 07/01/2021   Procedure: TRANSURETHRAL RESECTION OF BLADDER TUMOR (TURBT);  Surgeon: Remi Haggard, MD;  Location: [facility];  Service: Urology;  Laterality: N/A;       Family History  Problem Relation Age of Onset   Cancer Mother    Heart disease Father    Diabetes Father    Stroke Father    Breast cancer Sister     Social History   Tobacco Use   Smoking status: Every Day    Packs/day: 0.25     Years: 55.00    Pack years: 13.75    Types: Cigarettes   Smokeless tobacco: Never   Tobacco comments:    Now 1 pack per week ( 2-3 per day) for last year as of 06-18-2021  Vaping Use   Vaping Use: Never used  Substance Use Topics   Alcohol use: Yes    Alcohol/week: 8.0 standard drinks    Types: 8 Glasses of wine per week    Comment: 2-3 glasses of wine a day   Drug use: No    Home Medications Prior to Admission medications   Medication Sig Start Date End Date Taking? Authorizing Provider  acetaminophen (TYLENOL) 500 MG tablet Take 1,000 mg by mouth every 6 (six) hours as needed for mild pain or headache.    [provider]  albuterol (VENTOLIN HFA) 108 (90 Base) MCG/ACT inhaler TAKE 2 PUFFS BY MOUTH EVERY 6 HOURS AS NEEDED FOR WHEEZE OR SHORTNESS OF BREATH 06/02/20   Caroline More, DO  atorvastatin (LIPITOR) 20 MG tablet Take 1 tablet (20 mg total) by mouth daily at 6 PM. Patient taking differently: Take 20 mg by mouth daily. 1400 pm 05/19/21   Mullis, Kiersten P, DO  b complex vitamins capsule Take 1 capsule by mouth daily. 10/04/15   Brunetta Genera, MD  docusate sodium (COLACE) 100 MG capsule Take 200 mg by mouth daily as needed for mild constipation.    [provider]  folic acid (FOLVITE) 1 MG tablet Take 1 mg by mouth daily.    [provider]  furosemide (LASIX) 20 MG tablet TAKE 1 TAB DAILY AS NEEDED FOR DOE, SOB, EDEMA, OR WEIGHT GAIN >3 POUNDS OVERNIGHT OR 5 POUNDS/WK. Please make overdue appt with Dr. Meda Coffee. 1st attempt 11/05/20   Dorothy Spark, MD  indapamide (LOZOL) 2.5 MG tablet Take 1 tablet (2.5 mg total) by mouth daily. 06/16/21   Gladys Damme, MD  pantoprazole (PROTONIX) 40 MG tablet Take once daily 01/09/21   Mullis, Kiersten P, DO  sildenafil (VIAGRA) 100 MG tablet Take 0.5 tablets (50 mg total) by mouth daily as needed for erectile dysfunction. 08/19/15   Mariel Aloe, MD  XARELTO 20 MG TABS tablet TAKE 1 TABLET (20 MG TOTAL)  BY MOUTH DAILY WITH SUPPER. 05/21/21   Mullis, Kiersten P, DO    Allergies    Patient has no known allergies.  Review of Systems   Review of Systems  Constitutional:  Negative for activity change and diaphoresis.  Eyes:  Negative for pain and visual disturbance.  Respiratory:  Negative for apnea, chest tightness and shortness of breath.   Cardiovascular:  Negative for chest pain and leg swelling.  Gastrointestinal:  Negative for nausea and vomiting.  Musculoskeletal:  Negative for gait problem.  Neurological:  Negative for syncope, speech difficulty, weakness, light-headedness, numbness and headaches.   Physical  Exam Updated Vital Signs BP (!) 185/114 (BP Location: Right Arm)   Pulse 95   Temp 98.5 F (36.9 C) (Oral)   Resp 15   Ht '5\' 10"'$  (1.778 m)   Wt 63.5 kg   SpO2 98%   BMI 20.09 kg/m   Physical Exam Vitals and nursing note reviewed.  Constitutional:      Appearance: Normal appearance.  HENT:     Head: Normocephalic and atraumatic.  Eyes:     General:        Right eye: No discharge.        Left eye: No discharge.  Cardiovascular:     Rate and Rhythm: Tachycardia present. Rhythm irregular.     Comments: Tachycardic on arrival but normal rate by discharge without medical intervention. Patient with irregular rhythm and extra beats consistent with Mobitz I with PACs noted on ECG, no murmurs, rubs, or gallops Pulmonary:     Effort: No respiratory distress.  Musculoskeletal:        General: No swelling.     Right lower leg: No edema.     Left lower leg: No edema.  Skin:    Capillary Refill: Capillary refill takes less than 2 seconds.  Neurological:     Mental Status: He is alert.     Cranial Nerves: No cranial nerve deficit.     Sensory: No sensory deficit.     Motor: No weakness.     Coordination: Coordination normal.  Psychiatric:        Mood and Affect: Mood normal.        Behavior: Behavior normal.        Thought Content: Thought content normal.         Judgment: Judgment normal.    ED Results / Procedures / Treatments   Labs (all labs ordered are listed, but only abnormal results are displayed) Labs Reviewed  CBC WITH DIFFERENTIAL/PLATELET - Abnormal; Notable for the following components:      Result Value   RBC 3.99 (*)    MCV 104.5 (*)    MCH 36.3 (*)    All other components within normal limits  COMPREHENSIVE METABOLIC PANEL - Abnormal; Notable for the following components:   Sodium 133 (*)    Creatinine, Ser 1.57 (*)    GFR, Estimated 45 (*)    All other components within normal limits  TROPONIN I (HIGH SENSITIVITY)  TROPONIN I (HIGH SENSITIVITY)    EKG None  Radiology No results found.  Procedures Procedures   Medications Ordered in ED Medications - No data to display   ED Course  I have reviewed the triage vital signs and the nursing notes.  Pertinent labs & imaging results that were available during my care of the patient were reviewed by me and considered in my medical decision making (see chart for details).  Clinical Course as of 07/15/21 2210  Tue Jul 15, 2021  2041 EKG discussed with cardiology fellow after comparing to previous he reports this is still consistent with Mobitz type I, patient is asymptomatic with this, continue with outpatient monitoring, no further emergent intervention recommended by cardiology. [KF]    Clinical Course User Index [KF] Janet Berlin   MDM Rules/Calculators/A&P  Patient presents with asymptomatic hypertension with tachycardia likely due to inadequate prescription control. Labwork and physical exam reassuring for no signs of end organ damage including kidney function stable compared to previous values, no focal neurological deficits, no vision changes, no headache. Patient with irregular  heart rhythm on physical exam. ECG shows Mobitz type 1 with PACs. Unclear whether there was any change compared to previous ECG, and so cardiology consulted and fellow states that  current ECG is consistent when compared to previous, patient is asymptomatic, and to continue with outpatient monitoring. No further emergent intervention recommended by cardiology. Considered giving labetalol injection to lower blood pressure prior to d/c, however BP decreased to 185/114, 179/97, and pulse to 95 on recheck. Discussed plan to discharge with patient to follow up with PCP at earliest availability. Patient instructed to return for new onset headache, neurologic deficit, shortness of breath, chest pain, or vision changes.  Final Clinical Impression(s) / ED Diagnoses Final diagnoses:  Hypertension, unspecified type    Rx / DC Orders ED Discharge Orders     None        Anselmo Pickler, PA-C 07/15/21 2210    Margette Fast, MD 07/18/21 [phone]

## 2021-07-15 NOTE — Discharge Instructions (Addendum)
Blood pressure is already improved here today. Patient to follow up with PCP to discuss medication changes as soon as he is able. Patient should return to the ED if he is experiencing new onset chest pain, shortness of breath, vision changes, mental status changes, new onset muscular weakness or numbness.

## 2021-07-15 NOTE — ED Provider Notes (Signed)
Emergency Medicine Provider Triage Evaluation Note  Craig Moore , a 80 y.o. male  was evaluated in triage.  Pt complains of elevated blood pressure with a systolic in the 123456.  Does report recent medication change, having a new medication added onto his regimen, which she has been doubling.  He does denies any chest pain, shortness of breath, no headaches.  Review of Systems  Positive:  Negative: Chest pain, shortness of breath, fever  Physical Exam  BP (!) 208/99 (BP Location: Right Arm)   Pulse (!) 116   Temp 99 F (37.2 C)   Resp 20   Ht '5\' 10"'$  (1.778 m)   Wt 63.5 kg   SpO2 97%   BMI 20.09 kg/m  Gen:   Awake, no distress   Resp:  Normal effort  MSK:   Moves extremities without difficulty  Other:    Medical Decision Making  Medically screening exam initiated at 6:09 PM.  Appropriate orders placed.  Craig Moore was informed that the remainder of the evaluation Craig Moore be completed by another provider, this initial triage assessment does not replace that evaluation, and the importance of remaining in the ED until their evaluation is complete.     Craig Fitting, PA-C 07/15/21 1817    Craig Boss, MD 07/15/21 [phone]

## 2021-07-16 ENCOUNTER — Ambulatory Visit (INDEPENDENT_AMBULATORY_CARE_PROVIDER_SITE_OTHER): Payer: 59 | Admitting: Family Medicine

## 2021-07-16 ENCOUNTER — Other Ambulatory Visit: Payer: Self-pay

## 2021-07-16 ENCOUNTER — Encounter: Payer: Self-pay | Admitting: Family Medicine

## 2021-07-16 VITALS — BP 162/89 | HR 100 | Wt 140.4 lb

## 2021-07-16 DIAGNOSIS — I10 Essential (primary) hypertension: Secondary | ICD-10-CM

## 2021-07-16 MED ORDER — CARVEDILOL 6.25 MG PO TABS: 6.2500 mg | ORAL_TABLET | Freq: Two times a day (BID) | ORAL | 0 refills | Status: DC

## 2021-07-16 NOTE — Patient Instructions (Signed)
It was great seeing you today!  Today we added Carvedilol (Coreg) to help lower your blood pressure which you Rece take twice daily.  Stop taking the medication if you feel nauseous, light headed, dizzy or having new and concerning symptoms.   Please check-out at the front desk before leaving the clinic. Please schedule to see your PCP in about 4 weeks to follow up on your blood pressure, but if you need to be seen earlier than that for any new issues we're happy to fit you in, just give [place] a call!  Visit Reminders: - Stop by the pharmacy to pick up your prescriptions  - Continue to work on your healthy eating habits and incorporating exercise into your daily life.     If you haven't already, sign up for My Chart to have easy access to your labs results, and communication with your primary care physician.  Feel free to call with any questions or concerns at any time, at [phone].   Take care,  Dr. Shary [facility]

## 2021-07-16 NOTE — Progress Notes (Signed)
    SUBJECTIVE:   CHIEF COMPLAINT / HPI:   Mr. Craig Moore is a 80 yo who presents to follow up on his blood pressure.He presented to the ED yesterday for elevated BP checked at home, measured to be 222/115. On arrival to ED BP 209/112. BP today in clinic 162/89. He has remained asymptomatic, denying headaches, change in vision, CP, SOB.   States BP medication Indapamide was doubled on 6/2, from 1.25 to 2.5. He endorses since then his BP has been worse. He reports an average home BP of about 140-150/80. States he is followed by a Cardiologist who has made adjustments to his medications for various reasons in the past but he does not currently have follow up scheduled with them.    PERTINENT  PMH / PSH:  HTN, HFpEF, CAD s/p stent  OBJECTIVE:   BP (!) 162/89   Pulse 100   Wt 140 lb 6.4 oz (63.7 kg)   SpO2 98%   BMI 20.15 kg/m    Physical exam General: well appearing, NAD Cardiovascular: RRR, no murmurs Lungs: CTAB. Normal WOB Abdomen: soft, non-distended, non-tender Skin: warm, dry. No edema  ASSESSMENT/PLAN:   No problem-specific Assessment & Plan notes found for this encounter.   Hypertension  Patient was seen in the ED yesterday for high blood pressure, reaching to 222/115.  Today in clinic BP is 162/89.  Patient is asymptomatic.  Continued him on his indapamide 2.5 mg, and added Coreg 6.25 mg twice a day given his history of CAD.  If this does not work well, can consider restarting amlodipine. Strict return precautions discussed.   Follow-up with PCP in 4 weeks to check on BP   [place]

## 2021-07-20 ENCOUNTER — Other Ambulatory Visit: Payer: Self-pay | Admitting: Family Medicine

## 2021-07-21 ENCOUNTER — Telehealth: Payer: Self-pay

## 2021-07-21 NOTE — Telephone Encounter (Signed)
Pt came into the office today requesting a refill for Pantoprazole '40mg'$ . He took his last pill yesterday.

## 2021-07-22 MED ORDER — PANTOPRAZOLE SODIUM 40 MG PO TBEC: DELAYED_RELEASE_TABLET | ORAL | 0 refills | Status: DC

## 2021-07-22 NOTE — Telephone Encounter (Signed)
Refill for pantoprazole request has already been sent in.  Must see patient at September appointment before approving future refills.  Gladys Damme, MD [place] [facility], PGY-3

## 2021-07-22 NOTE — Addendum Note (Signed)
Addended by: Bridget Hartshorn on: 07/22/2021 06:10 PM   Modules accepted: Orders

## 2021-08-12 ENCOUNTER — Other Ambulatory Visit: Payer: Self-pay | Admitting: Family Medicine

## 2021-08-13 ENCOUNTER — Ambulatory Visit: Payer: 59 | Admitting: Family Medicine

## 2021-08-13 ENCOUNTER — Other Ambulatory Visit: Payer: Self-pay

## 2021-08-13 DIAGNOSIS — I2510 Atherosclerotic heart disease of native coronary artery without angina pectoris: Secondary | ICD-10-CM

## 2021-08-13 DIAGNOSIS — R0600 Dyspnea, unspecified: Secondary | ICD-10-CM

## 2021-08-13 DIAGNOSIS — I119 Hypertensive heart disease without heart failure: Secondary | ICD-10-CM

## 2021-08-13 DIAGNOSIS — R0609 Other forms of dyspnea: Secondary | ICD-10-CM

## 2021-08-13 DIAGNOSIS — I10 Essential (primary) hypertension: Secondary | ICD-10-CM

## 2021-08-13 DIAGNOSIS — E785 Hyperlipidemia, unspecified: Secondary | ICD-10-CM

## 2021-08-13 MED ORDER — FUROSEMIDE 20 MG PO TABS: ORAL_TABLET | ORAL | 3 refills | Status: DC

## 2021-08-19 ENCOUNTER — Other Ambulatory Visit: Payer: Self-pay

## 2021-08-19 ENCOUNTER — Encounter: Payer: Self-pay | Admitting: Family Medicine

## 2021-08-19 ENCOUNTER — Ambulatory Visit (INDEPENDENT_AMBULATORY_CARE_PROVIDER_SITE_OTHER): Payer: 59 | Admitting: Family Medicine

## 2021-08-19 VITALS — BP 150/64 | HR 60 | Ht 70.0 in | Wt 146.0 lb

## 2021-08-19 DIAGNOSIS — I10 Essential (primary) hypertension: Secondary | ICD-10-CM | POA: Diagnosis not present

## 2021-08-19 DIAGNOSIS — Z1211 Encounter for screening for malignant neoplasm of colon: Secondary | ICD-10-CM

## 2021-08-19 DIAGNOSIS — Z0000 Encounter for general adult medical examination without abnormal findings: Secondary | ICD-10-CM | POA: Diagnosis not present

## 2021-08-19 DIAGNOSIS — F17200 Nicotine dependence, unspecified, uncomplicated: Secondary | ICD-10-CM | POA: Diagnosis not present

## 2021-08-19 MED ORDER — NIFEDIPINE ER OSMOTIC RELEASE 30 MG PO TB24
30.0000 mg | ORAL_TABLET | Freq: Every day | ORAL | 1 refills | Status: DC
Start: 2021-08-19 — End: 2021-09-15

## 2021-08-19 NOTE — Assessment & Plan Note (Signed)
Patient states that he Craig Moore get COVID and flu test at the pharmacy.  Fit test kit given to patient.  Caz remind in 2 weeks to return it.

## 2021-08-19 NOTE — Patient Instructions (Signed)
It was a pleasure to see you today!  For your blood pressure: continue to take the coreg 6.'25mg'$  twice a day and start taking procardia 30 mg once a day. Probably best to take this at night.  Follow up for your blood pressure on the second week of October. We Advit have someone call you to schedule, but if you do not hear from anyone, call the last week of September. If you have low heart rate, feel dizzy, have swelling in your legs, or a head ache, please call our office [phone] For free nicotine patches and smoking cessation support, call 1-800-QUIT-NOW    Be Well,  Dr. Chauncey Reading

## 2021-08-19 NOTE — Assessment & Plan Note (Signed)
Patient with complex hypertension, who was tried nearly all medications except MRAs.  Patient was unable to tolerate ACEs and arms due to hyperkalemia, concerned that he may have the same problem with MRA.  Continue Coreg 6.25 mg twice daily, unable to titrate this due to HR.  Precepted with Dr. Owens Shark.  Clete try nifedipine 30 mg daily.  Follow-up in 2 to 4 weeks.  At that time patient Nuel likely need ambulatory cough if he is not having good control.  Recommend he follow-up with Dr. Everitt Amber, and pharmacy clinic.  October appointments are not yet available and he does not wish to return in September.  [place]ris send message to have patient called in October to schedule an appointment.

## 2021-08-19 NOTE — Assessment & Plan Note (Signed)
Patient is interested in smoking cessation, recommended NicoDerm patch as he wishes to start with this medication.  He smokes 2 to 3 cigarettes/day, recommend 7 mg patch to start with.  Gave patient support network of 1 800 quit now.  He Dayron follow-up in pharmacy clinic for blood pressure cuff, can also discuss smoking cessation yet further with Dr. Valentina Lucks.

## 2021-08-19 NOTE — Progress Notes (Signed)
SUBJECTIVE:   CHIEF COMPLAINT / HPI: BP follow up  BP: BP not at goal today, 150s/70s, however, much improved! Current regimen is coreg 6.25 mg BID.  Patient reports he stopped taking indapamide 2.18m due to headache.  He also feels like the indapamide was not working.  He has previously been on lisinopril, however had to stop this due to hyperkalemia she is prone to.  He is also been on amlodipine, which she had a stop taking due to headaches.  Patient has tried almost all medications except for MRA.  He reports that he has had no dizziness, no palpitations, no falls, however his heart rate is very low today on the Coreg.  Healthcare maint: Counseled on flu and covid vaccines.  Patient reports that after his recent bladder cancer, he is interested in making sure he is up-to-date with other screenings for cancer.  I offered him a colonoscopy, which he declined.  However he is interested in a fit test.  I discussed the nature of the fit test and that is screening for risk for colon cancer, and if his risk is elevated, that the gold standard is to proceed with colonoscopy.  Patient accepts that this Darric be the recommendation and still wishes to get a fit test.  Tobacco use: Patient states that he is interested in tobacco cessation, specifically at this time he wants to try NicoDerm patches.  He asked if these are safe to use, I recommend that they are.  We discussed options for smoking cessation.  PERTINENT  PMH / PSH: Hypertension, CAD  OBJECTIVE:   BP (!) 150/64   Pulse 60   Ht _0  (1.778 m)   Wt 146 lb (66.2 kg)   SpO2 99%   BMI 20.95 kg/m   Nursing note and vitals reviewed GEN: Age-appropriate, AAM, resting comfortably in chair, NAD, WNWD Cardiac: Regular rate and rhythm. Normal S1/S2. No murmurs, rubs, or gallops appreciated. 2+ radial pulses. Lungs: Clear bilaterally to ascultation. No increased WOB, no accessory muscle usage. No w/r/r. Ext: no edema Psych: Pleasant and  appropriate   ASSESSMENT/PLAN:   HTN (hypertension) Patient with complex hypertension, who was tried nearly all medications except MRAs.  Patient was unable to tolerate ACEs and arms due to hyperkalemia, concerned that he may have the same problem with MRA.  Continue Coreg 6.25 mg twice daily, unable to titrate this due to HR.  Precepted with Dr. BOwens Shark  Lawayne try nifedipine 30 mg daily.  Follow-up in 2 to 4 weeks.  At that time patient Cylan likely need ambulatory cough if he is not having good control.  Recommend he follow-up with Dr. KEveritt Amber and pharmacy clinic.  October appointments are not yet available and he does not wish to return in September.  Glennon send message to have patient called in October to schedule an appointment.  Tobacco use disorder Patient is interested in smoking cessation, recommended NicoDerm patch as he wishes to start with this medication.  He smokes 2 to 3 cigarettes/day, recommend 7 mg patch to start with.  Gave patient support network of 1 800 quit now.  He Halsey follow-up in pharmacy clinic for blood pressure cuff, can also discuss smoking cessation yet further with Dr. KValentina Lucks  Healthcare maintenance Patient states that he Dowell get COVID and flu test at the pharmacy.  Fit test kit given to patient.  Adalberto remind in 2 weeks to return it.     CGladys Damme MD [facility]

## 2021-09-08 ENCOUNTER — Other Ambulatory Visit: Payer: Self-pay | Admitting: Family Medicine

## 2021-09-11 ENCOUNTER — Other Ambulatory Visit: Payer: Self-pay | Admitting: Family Medicine

## 2021-09-11 DIAGNOSIS — I10 Essential (primary) hypertension: Secondary | ICD-10-CM

## 2021-09-11 NOTE — Addendum Note (Signed)
Addended by: [place] Pink on: 09/11/2021 11:15 AM   Modules accepted: Orders

## 2021-09-13 LAB — FECAL OCCULT BLOOD, IMMUNOCHEMICAL: Fecal Occult Bld: NEGATIVE

## 2021-09-26 ENCOUNTER — Encounter: Payer: Self-pay | Admitting: Family Medicine

## 2021-10-11 ENCOUNTER — Other Ambulatory Visit: Payer: Self-pay | Admitting: Family Medicine

## 2021-10-11 DIAGNOSIS — I10 Essential (primary) hypertension: Secondary | ICD-10-CM

## 2021-11-04 ENCOUNTER — Other Ambulatory Visit: Payer: Self-pay | Admitting: Family Medicine

## 2021-11-04 DIAGNOSIS — I10 Essential (primary) hypertension: Secondary | ICD-10-CM

## 2021-11-06 NOTE — Telephone Encounter (Signed)
[facility] before further refills  

## 2021-11-12 ENCOUNTER — Other Ambulatory Visit: Payer: Self-pay | Admitting: Family Medicine

## 2021-11-12 DIAGNOSIS — I10 Essential (primary) hypertension: Secondary | ICD-10-CM

## 2021-11-13 ENCOUNTER — Telehealth: Payer: Self-pay | Admitting: Family Medicine

## 2021-11-13 ENCOUNTER — Telehealth: Payer: Self-pay

## 2021-11-13 ENCOUNTER — Other Ambulatory Visit: Payer: Self-pay | Admitting: Family Medicine

## 2021-11-13 NOTE — Telephone Encounter (Signed)
[facility] before further refills  

## 2021-11-13 NOTE — Telephone Encounter (Signed)
Patient presents to clinic regarding intermittent nose bleeds. Patient reports that at the beginning of this week he noticed dryness in his nose. Reports blowing his nose and seeing small amount of blood mixed in with mucus. States that today he noticed a couple drops of bright red blood. Denies active nose bleeds.   Patient was unsure if this nasal irritation was due to side effects of BP medications he is currently taking. Patient reports concerns as he had GI bleed in 2020. Patient is not currently having blood in stool or vomiting blood.   Patient denies blurry vision, chest pain, headache. Patient unable to check BP at home.   Patient has follow up appointment on Tuesday. Provided with ED precautions.   Precepted with Dr. Erin Hearing who agreed with plan.   Talbot Grumbling, RN

## 2021-11-18 ENCOUNTER — Other Ambulatory Visit: Payer: Self-pay

## 2021-11-18 ENCOUNTER — Ambulatory Visit (INDEPENDENT_AMBULATORY_CARE_PROVIDER_SITE_OTHER): Payer: 59 | Admitting: Family Medicine

## 2021-11-18 VITALS — BP 126/80 | HR 94 | Ht 70.0 in | Wt 145.2 lb

## 2021-11-18 DIAGNOSIS — I10 Essential (primary) hypertension: Secondary | ICD-10-CM | POA: Diagnosis not present

## 2021-11-18 MED ORDER — NIFEDIPINE ER OSMOTIC RELEASE 30 MG PO TB24: 30.0000 mg | ORAL_TABLET | Freq: Every day | ORAL | 3 refills | Status: DC

## 2021-11-18 NOTE — Progress Notes (Signed)
° ° °  SUBJECTIVE:   CHIEF COMPLAINT / HPI:   Mr. Staffa is a 80 yo who presents for BP follow up.  He was seen on 9/13 and blood pressure was 150s over 70s. Was on Coreg BID switched to Nifedipine ER 30mg  at that time. He reports completely stopping the Coreg and states the medication change has been working well for him. Denies any side effects from it. No lightheadedness or dizziness. Takes the medication in the morning so that he can have his glass of wine at night. He requests more refills since it is working well for him.    OBJECTIVE:   BP 126/80    Pulse 94    Ht 5\' 10"  (1.778 m)    Wt 145 lb 3.2 oz (65.9 kg)    SpO2 97%    BMI 20.83 kg/m    Physical exam General: well appearing, NAD Cardiovascular: RRR, no murmurs Lungs: CTAB. Normal WOB Abdomen: soft, non-distended Skin: warm, dry. No edema  ASSESSMENT/PLAN:   No problem-specific Assessment & Plan notes found for this encounter.   HTN BP 126/80 after switching from Coreg to Nifedipine on 9/13 which is a significant improvement from his previous visits. He denies lightheadedness or dizziness. Discussed taking it at night if he does feel symptomatic and to let us know. Sent in additional refills.   [place]

## 2021-11-18 NOTE — Patient Instructions (Signed)
It was great seeing you today!  Today you came in to check on your blood pressure after starting a new medication which looks great today at 126/80. Continue taking the Nifedipine daily, I have sent in additional refills for you.  As we discussed in the visit, let us know if you experience any lightheadedness or dizziness or any new and concerning symptoms.  Feel free to call with any questions or concerns at [phone].   Take care,  Dr. Shary [facility]

## 2022-01-11 ENCOUNTER — Other Ambulatory Visit: Payer: Self-pay | Admitting: Family Medicine

## 2022-01-11 DIAGNOSIS — I10 Essential (primary) hypertension: Secondary | ICD-10-CM

## 2022-05-05 ENCOUNTER — Other Ambulatory Visit: Payer: Self-pay

## 2022-05-05 MED ORDER — ATORVASTATIN CALCIUM 20 MG PO TABS: 20.0000 mg | ORAL_TABLET | Freq: Every day | ORAL | 0 refills | Status: DC

## 2022-05-25 ENCOUNTER — Other Ambulatory Visit: Payer: Self-pay

## 2022-05-25 MED ORDER — RIVAROXABAN 20 MG PO TABS: 20.0000 mg | ORAL_TABLET | Freq: Every day | ORAL | 1 refills | Status: DC

## 2022-08-21 ENCOUNTER — Other Ambulatory Visit: Payer: Self-pay | Admitting: Family Medicine

## 2022-08-21 MED ORDER — ATORVASTATIN CALCIUM 20 MG PO TABS: 20.0000 mg | ORAL_TABLET | Freq: Every day | ORAL | 0 refills | Status: DC

## 2022-08-21 NOTE — Telephone Encounter (Signed)
Patient walked in today needing refill on his Atorvastatin '20MG'$ .  Confirmed the pharmacy in the chart is correct.   Patient stated he is out took last one today.   Please advise.   Thanks!

## 2022-09-02 NOTE — Progress Notes (Signed)
    SUBJECTIVE:   CHIEF COMPLAINT / HPI:   HTN: Reports he is taking his nifedipine and indapamide and takes his Lasix as needed.  He takes blood pressure at home, and they run roughly 140s/70s. No chest pain, shortness of breath.   CKD: Has kidney appointment on November 11 With [facility]. Reports EGFr went from 43 to 50.   Gallbladder and Pancreatic Mass: Does not wish to do yearly scanning. No nausea vomiting, bowel movements are okay sometimes has constipations.   Health Maintenance: Patient is adamant he Finlay only do blood work or a vaccination but not both.   PERTINENT  PMH / PSH: HTN, CAD, CHF, Chronic DVT, COPD, CKD 3, Gallbladder mass, Pancreatic mass  OBJECTIVE:   BP (!) 150/70   Pulse 99   Ht $R'5\' 10"'YU$  (1.778 m)   Wt 138 lb 6.4 oz (62.8 kg)   SpO2 100%   BMI 19.86 kg/m   General: NAD  Cardiovascular: RRR, no murmurs, no peripheral edema Respiratory: normal WOB on RA, CTAB, no wheezes, ronchi or rales Abdomen: soft, NTTP, no rebound or guarding Extremities: Moving all 4 extremities equally   ASSESSMENT/PLAN:   HTN (hypertension) Patient is doing well and measuring at home.  Blood pressures controlled.  Continue current regimen of indapamide 2.5 mg daily, Procardia XL 30 mg daily, Lasix 20 mg daily as needed. -CBC, BMP  Gallbladder mass Discussed with patient abdominal MRI for yearly surveillance of both gallbladder mass and pancreatic mass patient does not wish to proceed with MRI at this time.  Patient states he Leven think about it and reconvene at next visit.  Healthcare maintenance Patient states he [facility]  Does not wish to get COVID-vaccine.  We Haider plan to discuss colonoscopy and lung cancer screening at next visit.  CKD (chronic kidney disease) stage 3, GFR 30-59 ml/min (HCC) Discussed with patient, has appointment with kidney doctors on November 11.  Yonatan monitor creatinine today with BMP. -BMP -Request records from  [facility]  Dyslipidemia Continue taking Lipitor 20 mg daily. -Lipid Panel     Salvadore Oxford, MD [place]

## 2022-09-03 ENCOUNTER — Encounter: Payer: Self-pay | Admitting: Family Medicine

## 2022-09-03 ENCOUNTER — Ambulatory Visit (INDEPENDENT_AMBULATORY_CARE_PROVIDER_SITE_OTHER): Payer: 59 | Admitting: Family Medicine

## 2022-09-03 VITALS — BP 150/70 | HR 99 | Ht 70.0 in | Wt 138.4 lb

## 2022-09-03 DIAGNOSIS — I82403 Acute embolism and thrombosis of unspecified deep veins of lower extremity, bilateral: Secondary | ICD-10-CM

## 2022-09-03 DIAGNOSIS — E785 Hyperlipidemia, unspecified: Secondary | ICD-10-CM | POA: Diagnosis not present

## 2022-09-03 DIAGNOSIS — I10 Essential (primary) hypertension: Secondary | ICD-10-CM | POA: Diagnosis not present

## 2022-09-03 DIAGNOSIS — Z0000 Encounter for general adult medical examination without abnormal findings: Secondary | ICD-10-CM | POA: Diagnosis not present

## 2022-09-03 DIAGNOSIS — N1832 Chronic kidney disease, stage 3b: Secondary | ICD-10-CM

## 2022-09-03 DIAGNOSIS — K828 Other specified diseases of gallbladder: Secondary | ICD-10-CM

## 2022-09-03 NOTE — Assessment & Plan Note (Addendum)
Discussed with patient abdominal MRI for yearly surveillance of both gallbladder mass and pancreatic mass patient does not wish to proceed with MRI at this time.  Patient states he Alver think about it and reconvene at next visit.

## 2022-09-03 NOTE — Patient Instructions (Signed)
It was great to see you! Thank you for allowing me to participate in your care!  I recommend that you always bring your medications to each appointment as this makes it easy to ensure we are on the correct medications and helps [place] not miss when refills are needed.  Our plans for today:  -Discussed your blood pressure, you are doing well on your current medications please continue taking them. -Please continue your appointments with [facility] and [facility].  We are checking some labs today, I Matson call you if they are abnormal Craig Moore send you a MyChart message or a letter if they are normal.  If you do not hear about your labs in the next 2 weeks please let us know.  Take care and seek immediate care sooner if you develop any concerns.   Dr. Salvadore Oxford, MD [name]

## 2022-09-03 NOTE — Assessment & Plan Note (Addendum)
Patient is doing well and measuring at home.  Blood pressures controlled.  Continue current regimen of indapamide 2.5 mg daily, Procardia XL 30 mg daily, Lasix 20 mg daily as needed. -CBC, BMP

## 2022-09-03 NOTE — Assessment & Plan Note (Signed)
Continue taking Lipitor 20 mg daily. -Lipid Panel

## 2022-09-03 NOTE — Assessment & Plan Note (Signed)
Patient states he Craig [facility]  Does not wish to get COVID-vaccine.  We Barre plan to discuss colonoscopy and lung cancer screening at next visit.

## 2022-09-03 NOTE — Assessment & Plan Note (Addendum)
Discussed with patient, has appointment with kidney doctors on November 11.  Kalin monitor creatinine today with BMP. -BMP -Request records from [facility], [facility]

## 2022-09-04 ENCOUNTER — Encounter: Payer: Self-pay | Admitting: Family Medicine

## 2022-09-04 LAB — BASIC METABOLIC PANEL
BUN/Creatinine Ratio: 7 — ABNORMAL LOW (ref 10–24)
BUN: 11 mg/dL (ref 8–27)
CO2: 22 mmol/L (ref 20–29)
Calcium: 8.9 mg/dL (ref 8.6–10.2)
Chloride: 97 mmol/L (ref 96–106)
Creatinine, Ser: 1.51 mg/dL — ABNORMAL HIGH (ref 0.76–1.27)
Glucose: 102 mg/dL — ABNORMAL HIGH (ref 70–99)
Potassium: 4.9 mmol/L (ref 3.5–5.2)
Sodium: 135 mmol/L (ref 134–144)
eGFR: 47 mL/min/{1.73_m2} — ABNORMAL LOW (ref >59)

## 2022-09-04 LAB — CBC
Hematocrit: 40.5 % (ref 37.5–51.0)
Hemoglobin: 14.3 g/dL (ref 13.0–17.7)
MCH: 35.2 pg — ABNORMAL HIGH (ref 26.6–33.0)
MCHC: 35.3 g/dL (ref 31.5–35.7)
MCV: 100 fL — ABNORMAL HIGH (ref 79–97)
Platelets: 209 10*3/uL (ref 150–450)
RBC: 4.06 x10E6/uL — ABNORMAL LOW (ref 4.14–5.80)
RDW: 11 % — ABNORMAL LOW (ref 11.6–15.4)
WBC: 4.5 10*3/uL (ref 3.4–10.8)

## 2022-09-04 LAB — LIPID PANEL
Chol/HDL Ratio: 1.6 ratio (ref 0.0–5.0)
Cholesterol, Total: 170 mg/dL (ref 100–199)
HDL: 105 mg/dL (ref >39)
LDL Chol Calc (NIH): 51 mg/dL (ref 0–99)
Triglycerides: 75 mg/dL (ref 0–149)
VLDL Cholesterol Cal: 14 mg/dL (ref 5–40)

## 2022-09-04 NOTE — Progress Notes (Signed)
CKD - Patient's creatinine stable from previous. Continue seeing [name].   HTN - Potassium high end of normal at 4.9. Osmond continue to monitor.   HLD - LDL at goal of 51.  Hx of Anemia - Hg 14.3.

## 2022-09-15 ENCOUNTER — Other Ambulatory Visit: Payer: Self-pay

## 2022-09-15 DIAGNOSIS — I82503 Chronic embolism and thrombosis of unspecified deep veins of lower extremity, bilateral: Secondary | ICD-10-CM

## 2022-09-15 MED ORDER — RIVAROXABAN 20 MG PO TABS: 20.0000 mg | ORAL_TABLET | Freq: Every day | ORAL | 0 refills | Status: DC

## 2022-09-16 ENCOUNTER — Other Ambulatory Visit: Payer: Self-pay | Admitting: Family Medicine

## 2022-09-21 ENCOUNTER — Telehealth: Payer: Self-pay

## 2022-09-21 MED ORDER — PANTOPRAZOLE SODIUM 40 MG PO TBEC: 40.0000 mg | DELAYED_RELEASE_TABLET | Freq: Every day | ORAL | 0 refills | Status: DC

## 2022-09-21 NOTE — Telephone Encounter (Signed)
Patient calls nurse line requesting refill of pantoprazole 40 mg. This is not on current medication list.   If appropriate[facility] [address].   Talbot Grumbling, RN

## 2022-09-21 NOTE — Telephone Encounter (Signed)
On chart review, patient has had an acute upper GI bleed in 2020 where he was on PPI therapy for 3 months.  Appears his PPI had been discontinued.  I Craig Moore prescribe a 30-day supply of pantoprazole 40 mg.  If the patient is starting to have GI symptoms he Garett need an appointment to follow-up on these concerns.  Darci Current, DO [facility], PGY-1 09/21/22 3:13 PM

## 2022-10-08 ENCOUNTER — Other Ambulatory Visit: Payer: Self-pay

## 2022-10-08 DIAGNOSIS — I10 Essential (primary) hypertension: Secondary | ICD-10-CM

## 2022-10-08 DIAGNOSIS — R0609 Other forms of dyspnea: Secondary | ICD-10-CM

## 2022-10-08 DIAGNOSIS — I119 Hypertensive heart disease without heart failure: Secondary | ICD-10-CM

## 2022-10-08 DIAGNOSIS — E785 Hyperlipidemia, unspecified: Secondary | ICD-10-CM

## 2022-10-08 DIAGNOSIS — I2510 Atherosclerotic heart disease of native coronary artery without angina pectoris: Secondary | ICD-10-CM

## 2022-10-08 MED ORDER — FUROSEMIDE 20 MG PO TABS: ORAL_TABLET | ORAL | 0 refills | Status: DC

## 2022-10-14 ENCOUNTER — Other Ambulatory Visit: Payer: Self-pay | Admitting: Student

## 2022-10-14 DIAGNOSIS — Z8719 Personal history of other diseases of the digestive system: Secondary | ICD-10-CM

## 2022-10-24 NOTE — Progress Notes (Unsigned)
Cardiology Office Note:    Date:  10/26/2022   ID:  Craig Moore, DOB Oct 10, 1941, MRN [id number]  PCP:  Craig Oxford, MD   [place] Providers Cardiologist:  Craig Bergeron, MD {  Referring MD: Craig Oxford, MD    History of Present Illness:    Craig Moore is a 81 y.o. 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 Meda Moore who now presents to clinic for follow-up.  Saw Dr. Meda Moore on 10/2019 where he was doing well. No anginal symptoms. 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 Was seen in follow-up on 05/2021 for pre-operative evaluation prior to bladder tumor resection. At that visit, he was doing well without anginal symptoms.   Today, the patient overall feels okay. Recovered from tumor resection surgery and has been followed Urology closely. He otherwise feels well from a CV standpoint. No chest pain, SOB, orthopnea, PND or palpitations. Tolerating xarelto without issues. Continues to have mild blood in the urine intermittently. Hemoglobin has been stable.   Blood pressure very elevated today but states he is nervous in MD office. Declined starting new medication today but Craig Moore follow-up with PCP to address further.   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   CAD (coronary artery disease)    Non-STEMI May, 2006, bare-metal stent  //   nuclear, January, 2012, EF 70%, no ischemia   CHF (congestive heart failure) ([place])    CKD (chronic kidney disease), stage III ([place])    see dr Hassell Done webb, ckd stage 3 lov dr webb 04-09-2021   Constipation 06/18/2021   DVT (deep venous thrombosis) ([place])    December, 2011 and 2016   Ejection fraction    EF 60-65% echo 08-17-2018   H/O: GI bleed 11/2019   History of blood transfusion 11/2019   1 unit  given due to gi bleed   Hyperlipidemia    Hypertension    Myocardial infarction ([place]) 2006   Tobacco abuse    Wears dentures 06/18/2021   full dentures    Past Surgical History:  Procedure Laterality Date   BIOPSY  11/28/2019   Procedure: BIOPSY;  Surgeon: Clarene Essex, MD;  Location: [facility];  Service: Endoscopy;;   CORONARY ANGIOPLASTY  2006   CYSTOSCOPY W/ RETROGRADES Bilateral 07/01/2021   Procedure: CYSTOSCOPY WITH RETROGRADE PYELOGRAM;  Surgeon: Remi Haggard, MD;  Location: [facility];  Service: Urology;  Laterality: Bilateral;   ESOPHAGOGASTRODUODENOSCOPY (EGD) WITH PROPOFOL N/A 11/28/2019   Procedure: ESOPHAGOGASTRODUODENOSCOPY (EGD) WITH PROPOFOL;  Surgeon: Clarene Essex, MD;  Location: [facility];  Service: Endoscopy;  Laterality: N/A;   EYE SURGERY Right 2018   right eye cataract   MULTIPLE TOOTH EXTRACTIONS     all teeth pulled   TRANSURETHRAL RESECTION OF BLADDER TUMOR N/A 07/01/2021   Procedure: TRANSURETHRAL RESECTION OF BLADDER TUMOR (TURBT);  Surgeon: Remi Haggard, MD;  Location[facility];  Service: Urology;  Laterality: N/A;    Current Medications: Current Meds  Medication Sig   atorvastatin (LIPITOR) 20 MG tablet Take 1 tablet (20 mg total) by mouth daily.   b complex vitamins capsule Take 1 capsule by mouth daily.   docusate sodium (COLACE) 100 MG capsule Take 200 mg  by mouth daily as needed for mild constipation.   ergocalciferol (VITAMIN D2) 1.25 MG (50000 UT) capsule Take 50,000 Units by mouth once a week.   finasteride (PROSCAR) 5 MG tablet Take 5 mg by mouth daily.   folic acid (FOLVITE) 1 MG tablet Take 1 mg by mouth daily.   furosemide (LASIX) 20 MG tablet TAKE 1 TAB DAILY AS NEEDED FOR DOE, SOB, EDEMA, OR WEIGHT GAIN >3 POUNDS OVERNIGHT OR 5 POUNDS/WK.   indapamide (LOZOL) 2.5 MG tablet Take 1 tablet (2.5 mg total) by mouth daily.   NIFEdipine (PROCARDIA-XL/NIFEDICAL-XL) 30 MG 24 hr tablet TAKE 1 TABLET BY  MOUTH EVERY DAY   pantoprazole (PROTONIX) 40 MG tablet TAKE 1 TABLET BY MOUTH EVERY DAY   rivaroxaban (XARELTO) 20 MG TABS tablet Take 1 tablet (20 mg total) by mouth daily with supper. Please make APPT before next refill!   sildenafil (VIAGRA) 100 MG tablet Take 0.5 tablets (50 mg total) by mouth daily as needed for erectile dysfunction.     Allergies:   Patient has no known allergies.   Social History   Socioeconomic History   Marital status: Legally Separated    Spouse name: Not on file   Number of children: Not on file   Years of education: Not on file   Highest education level: Not on file  Occupational History   Not on file  Tobacco Use   Smoking status: Every Day    Packs/day: 0.25    Years: 55.00    Total pack years: 13.75    Types: Cigarettes   Smokeless tobacco: Never   Tobacco comments:    Now 1 pack per week ( 2-3 per day) for last year as of 06-18-2021  Vaping Use   Vaping Use: Never used  Substance and Sexual Activity   Alcohol use: Yes    Alcohol/week: 8.0 standard drinks of alcohol    Types: 8 Glasses of wine per week    Comment: 2-3 glasses of wine a day   Drug use: No   Sexual activity: Not Currently  Other Topics Concern   Not on file  Social History Narrative   Not on file   Social Determinants of Health   Financial Resource Strain: Not on file  Food Insecurity: Not on file  Transportation Needs: Not on file  Physical Activity: Not on file  Stress: Not on file  Social Connections: Not on file     Family History: The patient's family history includes Breast cancer in his sister; Cancer in his mother; Diabetes in his father; Heart disease in his father; Stroke in his father.  ROS:   Please see the history of present illness.    Review of Systems  Constitutional:  Negative for chills and fever.  HENT:  Negative for sore throat.   Eyes:  Negative for blurred vision.  Respiratory:  Positive for shortness of breath.   Cardiovascular:  Negative  for chest pain, palpitations, orthopnea, claudication, leg swelling and PND.  Gastrointestinal:  Negative for nausea and vomiting.  Genitourinary:  Negative for hematuria.  Musculoskeletal:  Negative for falls.  Neurological:  Negative for dizziness and loss of consciousness.     EKGs/Labs/Other Studies Reviewed:    The following studies were reviewed today: TTE Mar 09, 2018: Study Conclusions   - Left ventricle: The cavity size was normal. Wall thickness was    increased in a pattern of mild LVH. Indeterminant diastolic    function. Systolic function was normal. The estimated ejection  fraction was in the range of 60% to 65%. Wall motion was normal;    there were no regional wall motion abnormalities.  - Aortic valve: There was no stenosis.  - Aorta: Aortic root dimension: 37 mm (ED). Ascending aortic    diameter: 37 mm (S).  - Aortic root: The aortic root and ascending aorta were borderline    dilated.  - Mitral valve: There was trivial regurgitation.  - Right ventricle: The cavity size was normal. Systolic function    was normal.  - Tricuspid valve: Peak RV-RA gradient (S): 31 mm Hg.  - Pulmonary arteries: PA peak pressure: 34 mm Hg (S).  - Inferior vena cava: The vessel was normal in size. The    respirophasic diameter changes were in the normal range (>= 50%),    consistent with normal central venous pressure.  - Pericardium, extracardiac: A trivial pericardial effusion was    identified.   Impressions:   - Normal LV size with mild LV hypertrophy. EF 60-65%. Normal RV    size and systolic function. No significant valvular    abnormalities.  EKG:  EKG is  ordered today.  The ekg ordered today demonstrates sinus, mobitz I AVB, iLBBB, similar to prior  Recent Labs: 09/03/2022: BUN 11; Creatinine, Ser 1.51; Hemoglobin 14.3; Platelets 209; Potassium 4.9; Sodium 135  Recent Lipid Panel    Component Value Date/Time   CHOL 170 09/03/2022 1509   TRIG 75 09/03/2022 1509   HDL 105  09/03/2022 1509   CHOLHDL 1.6 09/03/2022 1509   CHOLHDL 2.1 08/19/2015 1508   VLDL 22 08/19/2015 1508   LDLCALC 51 09/03/2022 1509   LDLDIRECT 67 01/13/2013 1527         Physical Exam:    VS:  BP (!) 174/89   Pulse 76   Ht '5\' 9"'$  (1.753 m)   Wt 142 lb (64.4 kg)   SpO2 97%   BMI 20.97 kg/m     Wt Readings from Last 3 Encounters:  10/26/22 142 lb (64.4 kg)  09/03/22 138 lb 6.4 oz (62.8 kg)  11/18/21 145 lb 3.2 oz (65.9 kg)     GEN:  Thin, comfortable, NAD HEENT: Normal NECK: No JVD; No carotid bruits CARDIAC: RRR, no murmurs RESPIRATORY:  Diminished but clear  ABDOMEN: Soft, non-tender, non-distended MUSCULOSKELETAL:  No edema; No deformity  SKIN: Warm and dry NEUROLOGIC:  Alert and oriented x 3 PSYCHIATRIC:  Normal affect   ASSESSMENT:    1. Coronary artery disease involving native coronary artery of native heart without angina pectoris   2. CAD in native artery   3. Essential hypertension   4. Mixed hyperlipidemia   5. Stage 3a chronic kidney disease ([place])   6. 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 Mobitz (type) I (Wenckebach's) atrioventricular block    PLAN:    In order of problems listed above:  #CAD s/p BMS in 2006: Doing well without anginal symptoms. TTE 2019 with LVEF 60-65% with no WMA, no significant valve disease.  -Continue lipitor '20mg'$  daily -Not on ASA or plavix given need for xarelto  #Mobitz Type I AVB: Noted on multiple ECGs. Patient is asymptomatic with no lightheadedness, dizziness or syncope. Able to walk without issues. Shawon continue to monitor.  #Recurrent DVTs on Xarelto: -Continue xarelto '20mg'$  daily  #HTN: -Continue idapamide 2.'5mg'$  daily -Monitor at home with goal <120s/80s  #HLD: LDL controlled and at goal of <70. -Last LDL 51 on 08/2022 -  Continue lipitor '20mg'$  daily  #CKD Stage IIIA: Followed by neprhology. -Continue lasix '20mg'$  as needed -Continue idapamide 2.'5mg'$   daily  #Bladder Cancer s/p tumor resection:  Medication Adjustments/Labs and Tests Ordered: Current medicines are reviewed at length with the patient today.  Concerns regarding medicines are outlined above.  Orders Placed This Encounter  Procedures   EKG 12-Lead   No orders of the defined types were placed in this encounter.   Patient Instructions  Medication Instructions:   Your physician recommends that you continue on your current medications as directed. Please refer to the Current Medication list given to you today.  *If you need a refill on your cardiac medications before your next appointment, please call your pharmacy*     Follow-Up: At [facility], you and your health needs are our priority.  As part of our continuing mission to provide you with exceptional heart care, we have created designated Provider Care Teams.  These Care Teams include your primary Cardiologist (physician) and Advanced Practice Providers (APPs -  Physician Assistants and Nurse Practitioners) who all work together to provide you with the care you need, when you need it.  We recommend signing up for the patient portal called "MyChart".  Sign up information is provided on this After Visit Summary.  MyChart is used to connect with patients for Virtual Visits (Telemedicine).  Patients are able to view lab/test results, encounter notes, upcoming appointments, etc.  Non-urgent messages can be sent to your provider as well.   To learn more about what you can do with MyChart, go to [email or web address].    Your next appointment:   1 year(s)  The format for your next appointment:   In Person  Provider:   Freada Bergeron, MD     Important Information About Sugar         Signed, Craig Bergeron, MD  10/26/2022 4:47 PM

## 2022-10-26 ENCOUNTER — Ambulatory Visit: Payer: 59 | Attending: Cardiology | Admitting: Cardiology

## 2022-10-26 ENCOUNTER — Encounter: Payer: Self-pay | Admitting: Cardiology

## 2022-10-26 VITALS — BP 174/89 | HR 76 | Ht 69.0 in | Wt 142.0 lb

## 2022-10-26 DIAGNOSIS — I825Y3 Chronic embolism and thrombosis of unspecified deep veins of proximal lower extremity, bilateral: Secondary | ICD-10-CM

## 2022-10-26 DIAGNOSIS — I10 Essential (primary) hypertension: Secondary | ICD-10-CM

## 2022-10-26 DIAGNOSIS — I441 Atrioventricular block, second degree: Secondary | ICD-10-CM

## 2022-10-26 DIAGNOSIS — E782 Mixed hyperlipidemia: Secondary | ICD-10-CM

## 2022-10-26 DIAGNOSIS — N1831 Chronic kidney disease, stage 3a: Secondary | ICD-10-CM | POA: Diagnosis not present

## 2022-10-26 DIAGNOSIS — I2510 Atherosclerotic heart disease of native coronary artery without angina pectoris: Secondary | ICD-10-CM | POA: Diagnosis not present

## 2022-10-26 NOTE — Patient Instructions (Signed)
Medication Instructions:   Your physician recommends that you continue on your current medications as directed. Please refer to the Current Medication list given to you today.  *If you need a refill on your cardiac medications before your next appointment, please call your pharmacy*    Follow-Up: At [facility], you and your health needs are our priority.  As part of our continuing mission to provide you with exceptional heart care, we have created designated Provider Care Teams.  These Care Teams include your primary Cardiologist (physician) and Advanced Practice Providers (APPs -  Physician Assistants and Nurse Practitioners) who all work together to provide you with the care you need, when you need it.  We recommend signing up for the patient portal called "MyChart".  Sign up information is provided on this After Visit Summary.  MyChart is used to connect with patients for Virtual Visits (Telemedicine).  Patients are able to view lab/test results, encounter notes, upcoming appointments, etc.  Non-urgent messages can be sent to your provider as well.   To learn more about what you can do with MyChart, go to [email or web address].    Your next appointment:   1 year(s)  The format for your next appointment:   In Person  Provider:   Heather E Pemberton, MD       Important Information About Sugar       

## 2022-11-03 ENCOUNTER — Other Ambulatory Visit: Payer: Self-pay

## 2022-11-03 DIAGNOSIS — I10 Essential (primary) hypertension: Secondary | ICD-10-CM

## 2022-11-03 DIAGNOSIS — E785 Hyperlipidemia, unspecified: Secondary | ICD-10-CM

## 2022-11-03 DIAGNOSIS — I119 Hypertensive heart disease without heart failure: Secondary | ICD-10-CM

## 2022-11-03 DIAGNOSIS — R0609 Other forms of dyspnea: Secondary | ICD-10-CM

## 2022-11-03 DIAGNOSIS — I2510 Atherosclerotic heart disease of native coronary artery without angina pectoris: Secondary | ICD-10-CM

## 2022-11-03 MED ORDER — FUROSEMIDE 20 MG PO TABS: ORAL_TABLET | ORAL | 3 refills | Status: DC

## 2022-11-18 ENCOUNTER — Other Ambulatory Visit: Payer: Self-pay

## 2022-11-18 DIAGNOSIS — I10 Essential (primary) hypertension: Secondary | ICD-10-CM

## 2022-11-18 MED ORDER — NIFEDIPINE ER OSMOTIC RELEASE 30 MG PO TB24: 30.0000 mg | ORAL_TABLET | Freq: Every day | ORAL | 0 refills | Status: DC

## 2023-01-25 ENCOUNTER — Other Ambulatory Visit: Payer: Self-pay | Admitting: Family Medicine

## 2023-01-25 DIAGNOSIS — Z8719 Personal history of other diseases of the digestive system: Secondary | ICD-10-CM

## 2023-01-27 NOTE — Telephone Encounter (Signed)
Patient calls nurse line regarding refill request.   Advised that last refill stated that he needed an appointment for further refills. Patient states that he was seen in September and should have received refills for 12 months.   Ka forward to PCP for further advisement.   Thanks.   Talbot Grumbling, RN

## 2023-01-28 ENCOUNTER — Other Ambulatory Visit: Payer: Self-pay | Admitting: Family Medicine

## 2023-01-28 DIAGNOSIS — Z8719 Personal history of other diseases of the digestive system: Secondary | ICD-10-CM

## 2023-01-28 MED ORDER — PANTOPRAZOLE SODIUM 40 MG PO TBEC: 40.0000 mg | DELAYED_RELEASE_TABLET | Freq: Every day | ORAL | 0 refills | Status: DC

## 2023-02-01 ENCOUNTER — Other Ambulatory Visit: Payer: Self-pay | Admitting: Family Medicine

## 2023-02-01 DIAGNOSIS — Z8719 Personal history of other diseases of the digestive system: Secondary | ICD-10-CM

## 2023-02-01 MED ORDER — PANTOPRAZOLE SODIUM 40 MG PO TBEC: 40.0000 mg | DELAYED_RELEASE_TABLET | Freq: Every day | ORAL | 3 refills | Status: DC

## 2023-02-10 NOTE — Progress Notes (Signed)
    SUBJECTIVE:   CHIEF COMPLAINT / HPI: med refill  HTN - Measuring at home says it has been controlled. No chest pain, SOB, no swelling in legs.  States diastolics are in the 0000000 usually at home.  CHF- States he takes Lasix as needed for swelling but only needs once or twice per month.  Sees his cardiologist once per year.  Hx of bladder cancer - Sees [facility].  States he has had blood in his urine for years and multiple TURP procedures.  Still has blood in his urine.  He is not in pain.  Pancreatic mass - Brought up previous discussion discussion about early scanning with MRI.  Patient does not wish to get an MRI at this time.  Stopped smoking in November.   CKD3 - Last saw [facility] in November.  PERTINENT  PMH / PSH: HTN, CAD, CHF, Chronic DVT, COPD, CKD 3, Gallbladder mass, Pancreatic mass   OBJECTIVE:   BP 130/84   Pulse 75   Ht '5\' 9"'$  (1.753 m)   Wt 141 lb (64 kg)   SpO2 98%   BMI 20.82 kg/m   General: NAD Cardiovascular: RRR, no murmurs, no peripheral edema Respiratory: normal WOB on RA, CTAB, no wheezes, ronchi or rales Abdomen: soft, NTTP, no rebound or guarding Extremities: Moving all 4 extremities equally   ASSESSMENT/PLAN:   Primary hypertension Assessment & Plan: Blood pressure elevated today, however diastolic relatively low, and relatively wide pulse pressure.  Trayquan not increase home medications for risk of bottoming diastolic.  Refilled home meds.  Orders: -     NIFEdipine ER Osmotic Release; Take 1 tablet (30 mg total) by mouth daily.  Dispense: 90 tablet; Refill: 2 -     Indapamide; Take 1 tablet (2.5 mg total) by mouth daily.  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 Please make APPT before next refill!  Dispense: 90 tablet; Refill: 1  H/O: upper  GI bleed -     Pantoprazole Sodium; Take 1 tablet (40 mg total) by mouth daily.  Dispense: 90 tablet; Refill: 1  Stage 3b chronic kidney disease ([place]) Assessment & Plan: Last went to nephrologist in November.  Jaysen request records.  Robson check electrolytes and kidney function today with BMP.  Orders: -     Basic metabolic panel  Pancreatic mass Assessment & Plan: Patient continues to refuse yearly surveillance with MRI.  Discussed possible cancer, patient verbalized understanding still refused.   Bladder tumor Assessment & Plan: Patient states he still has hematuria ongoing for several years, since being diagnosed with bladder cancer.  Recommended he follow-up with his urologist who prescribes his finasteride and has performed previous TURBT.  Patient agreeable and verbalized understanding.   Other orders -     Atorvastatin Calcium; Take 1 tablet (20 mg total) by mouth daily.  Dispense: 90 tablet; Refill: 1   Return in about 6 months (around 08/14/2023) for chronic med follow-up.  Salvadore Oxford, MD [place]

## 2023-02-11 ENCOUNTER — Encounter: Payer: Self-pay | Admitting: Family Medicine

## 2023-02-11 ENCOUNTER — Ambulatory Visit (INDEPENDENT_AMBULATORY_CARE_PROVIDER_SITE_OTHER): Payer: 59 | Admitting: Family Medicine

## 2023-02-11 VITALS — BP 130/84 | HR 75 | Ht 69.0 in | Wt 141.0 lb

## 2023-02-11 DIAGNOSIS — K8689 Other specified diseases of pancreas: Secondary | ICD-10-CM

## 2023-02-11 DIAGNOSIS — I10 Essential (primary) hypertension: Secondary | ICD-10-CM | POA: Diagnosis not present

## 2023-02-11 DIAGNOSIS — Z8719 Personal history of other diseases of the digestive system: Secondary | ICD-10-CM | POA: Diagnosis not present

## 2023-02-11 DIAGNOSIS — D494 Neoplasm of unspecified behavior of bladder: Secondary | ICD-10-CM

## 2023-02-11 DIAGNOSIS — I82503 Chronic embolism and thrombosis of unspecified deep veins of lower extremity, bilateral: Secondary | ICD-10-CM | POA: Diagnosis not present

## 2023-02-11 DIAGNOSIS — N1832 Chronic kidney disease, stage 3b: Secondary | ICD-10-CM | POA: Diagnosis not present

## 2023-02-11 DIAGNOSIS — I82403 Acute embolism and thrombosis of unspecified deep veins of lower extremity, bilateral: Secondary | ICD-10-CM

## 2023-02-11 MED ORDER — NIFEDIPINE ER OSMOTIC RELEASE 30 MG PO TB24: 30.0000 mg | ORAL_TABLET | Freq: Every day | ORAL | 2 refills | Status: DC

## 2023-02-11 MED ORDER — ATORVASTATIN CALCIUM 20 MG PO TABS: 20.0000 mg | ORAL_TABLET | Freq: Every day | ORAL | 1 refills | Status: DC

## 2023-02-11 MED ORDER — PANTOPRAZOLE SODIUM 40 MG PO TBEC: 40.0000 mg | DELAYED_RELEASE_TABLET | Freq: Every day | ORAL | 1 refills | Status: DC

## 2023-02-11 MED ORDER — INDAPAMIDE 2.5 MG PO TABS: 2.5000 mg | ORAL_TABLET | Freq: Every day | ORAL | 1 refills | Status: DC

## 2023-02-11 MED ORDER — RIVAROXABAN 20 MG PO TABS: 20.0000 mg | ORAL_TABLET | Freq: Every day | ORAL | 1 refills | Status: DC

## 2023-02-11 NOTE — Assessment & Plan Note (Signed)
Blood pressure elevated today, however diastolic relatively low, and relatively wide pulse pressure.  Jru not increase home medications for risk of bottoming diastolic.  Refilled home meds.

## 2023-02-11 NOTE — Assessment & Plan Note (Signed)
Last went to nephrologist in November.  Berlie request records.  Ryett check electrolytes and kidney function today with BMP.

## 2023-02-11 NOTE — Patient Instructions (Addendum)
It was great to see you! Thank you for allowing me to participate in your care!  I recommend that you always bring your medications to each appointment as this makes it easy to ensure we are on the correct medications and helps [place] not miss when refills are needed.  Our plans for today:  - I have refilled your medications. Please continue to take them as prescribed. - Please make an appointment if you change your mind about MRI, or colonoscopy.    Please arrive 15 minutes PRIOR to your next scheduled appointment time! If you do not, this affects OTHER patients' care.  Take care and seek immediate care sooner if you develop any concerns.   Dr. Salvadore Oxford, MD [place]

## 2023-02-11 NOTE — Assessment & Plan Note (Addendum)
Patient states he still has hematuria ongoing for several years, since being diagnosed with bladder cancer.  Recommended he follow-up with his urologist who prescribes his finasteride and has performed previous TURBT.  Patient agreeable and verbalized understanding.

## 2023-02-11 NOTE — Assessment & Plan Note (Signed)
Patient continues to refuse yearly surveillance with MRI.  Discussed possible cancer, patient verbalized understanding still refused.

## 2023-02-12 ENCOUNTER — Encounter: Payer: Self-pay | Admitting: Family Medicine

## 2023-02-12 LAB — BASIC METABOLIC PANEL
BUN/Creatinine Ratio: 8 — ABNORMAL LOW (ref 10–24)
BUN: 13 mg/dL (ref 8–27)
CO2: 19 mmol/L — ABNORMAL LOW (ref 20–29)
Calcium: 8.8 mg/dL (ref 8.6–10.2)
Chloride: 101 mmol/L (ref 96–106)
Creatinine, Ser: 1.63 mg/dL — ABNORMAL HIGH (ref 0.76–1.27)
Glucose: 85 mg/dL (ref 70–99)
Potassium: 4.8 mmol/L (ref 3.5–5.2)
Sodium: 135 mmol/L (ref 134–144)
eGFR: 42 mL/min/{1.73_m2} — ABNORMAL LOW (ref >59)

## 2023-03-09 ENCOUNTER — Telehealth: Payer: Self-pay | Admitting: Family Medicine

## 2023-03-09 NOTE — Telephone Encounter (Signed)
Called patient to schedule Medicare Annual Wellness Visit (AWV). Left message for patient to call back and schedule Medicare Annual Wellness Visit (AWV).  Last date of AWV:  AWVI eligible as of 12/07/2009  Please schedule an  AWVI appointment at any time with [facility] VISIT.  If any questions, please contact me at [phone].    Thank you,  [place] Direct dial  [phone]

## 2023-07-26 ENCOUNTER — Other Ambulatory Visit: Payer: Self-pay

## 2023-07-26 ENCOUNTER — Ambulatory Visit (INDEPENDENT_AMBULATORY_CARE_PROVIDER_SITE_OTHER): Payer: 59

## 2023-07-26 VITALS — Ht 69.0 in | Wt 141.0 lb

## 2023-07-26 DIAGNOSIS — Z0000 Encounter for general adult medical examination without abnormal findings: Secondary | ICD-10-CM | POA: Diagnosis not present

## 2023-07-26 DIAGNOSIS — E785 Hyperlipidemia, unspecified: Secondary | ICD-10-CM

## 2023-07-26 DIAGNOSIS — R0609 Other forms of dyspnea: Secondary | ICD-10-CM

## 2023-07-26 DIAGNOSIS — I2510 Atherosclerotic heart disease of native coronary artery without angina pectoris: Secondary | ICD-10-CM

## 2023-07-26 DIAGNOSIS — I119 Hypertensive heart disease without heart failure: Secondary | ICD-10-CM

## 2023-07-26 DIAGNOSIS — I10 Essential (primary) hypertension: Secondary | ICD-10-CM

## 2023-07-26 MED ORDER — FUROSEMIDE 20 MG PO TABS: ORAL_TABLET | ORAL | 0 refills | Status: DC

## 2023-07-26 NOTE — Progress Notes (Signed)
Subjective:   Craig Moore is a 82 y.o. male who presents for an Initial Medicare Annual Wellness Visit.  Visit Complete: Virtual  I connected with  Craig Moore on 07/26/23 by a audio enabled telemedicine application and verified that I am speaking with the correct person using two identifiers.  Patient Location: Home  Provider Location: Home Office  I discussed the limitations of evaluation and management by telemedicine. The patient expressed understanding and agreed to proceed.  Vital Signs: Because this visit was a virtual/telehealth visit, some criteria may be missing or patient reported. Any vitals not documented were not able to be obtained and vitals that have been documented are patient reported.   Review of Systems     Cardiac Risk Factors include: advanced age (>20men, >11 women);hypertension;male gender;dyslipidemia     Objective:    Today's Vitals   07/26/23 1335  Weight: 141 lb (64 kg)  Height: 5\' 9"  (1.753 m)   Body mass index is 20.82 kg/m.     07/26/2023    6:38 PM 02/11/2023    3:32 PM 09/03/2022    1:42 PM 08/19/2021    2:58 PM 07/16/2021   11:25 AM 07/15/2021    6:01 PM 07/01/2021   10:17 AM  Advanced Directives  Does Patient Have a Medical Advance Directive? No No No No No No No  Would patient like information on creating a medical advance directive? Yes (MAU/Ambulatory/Procedural Areas - Information given) No - Patient declined No - Patient declined No - Patient declined No - Patient declined  Yes (MAU/Ambulatory/Procedural Areas - Information given)    Current Medications (verified) Outpatient Encounter Medications as of 07/26/2023  Medication Sig   atorvastatin (LIPITOR) 20 MG tablet Take 1 tablet (20 mg total) by mouth daily.   b complex vitamins capsule Take 1 capsule by mouth daily.   docusate sodium (COLACE) 100 MG capsule Take 200 mg by mouth daily as needed for mild constipation.   ergocalciferol (VITAMIN D2) 1.25 MG (50000 UT) capsule Take  50,000 Units by mouth once a week.   finasteride (PROSCAR) 5 MG tablet Take 5 mg by mouth daily.   folic acid (FOLVITE) 1 MG tablet Take 1 mg by mouth daily.   furosemide (LASIX) 20 MG tablet TAKE 1 TAB DAILY AS NEEDED FOR DOE, SOB, EDEMA, OR WEIGHT GAIN >3 POUNDS OVERNIGHT OR 5 POUNDS/WK.   NIFEdipine (PROCARDIA-XL/NIFEDICAL-XL) 30 MG 24 hr tablet Take 1 tablet (30 mg total) by mouth daily.   pantoprazole (PROTONIX) 40 MG tablet Take 1 tablet (40 mg total) by mouth daily.   rivaroxaban (XARELTO) 20 MG TABS tablet Take 1 tablet (20 mg total) by mouth daily with supper. Please make APPT before next refill!   sildenafil (VIAGRA) 100 MG tablet Take 0.5 tablets (50 mg total) by mouth daily as needed for erectile dysfunction.   indapamide (LOZOL) 2.5 MG tablet Take 1 tablet (2.5 mg total) by mouth daily. (Patient not taking: Reported on 07/26/2023)   [DISCONTINUED] furosemide (LASIX) 20 MG tablet TAKE 1 TAB DAILY AS NEEDED FOR DOE, SOB, EDEMA, OR WEIGHT GAIN >3 POUNDS OVERNIGHT OR 5 POUNDS/WK.   No facility-administered encounter medications on file as of 07/26/2023.    Allergies (verified) Patient has no known allergies.   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   CAD (coronary artery disease)    Non-STEMI May, 2006, bare-metal stent  //  nuclear, January, 2012, EF 70%, no ischemia   CHF (congestive heart failure) (HCC)    CKD (chronic kidney disease), stage III (HCC)    see dr Elvis Coil, ckd stage 3 lov dr webb 04-09-2021   Constipation 06/18/2021   COPD (chronic obstructive pulmonary disease) (HCC)    DVT (deep venous thrombosis) (HCC)    December, 2011 and 2016   Ejection fraction    EF 60-65% echo 08-17-2018   H/O: GI bleed 11/2019   History of blood transfusion 11/2019   1 unit given due to gi bleed   Hyperlipidemia    Hypertension    Myocardial infarction (HCC) 2006   Tobacco  abuse    Wears dentures 06/18/2021   full dentures   Past Surgical History:  Procedure Laterality Date   BIOPSY  11/28/2019   Procedure: BIOPSY;  Surgeon: Vida Rigger, MD;  Location: [facility] ENDOSCOPY;  Service: Endoscopy;;   CORONARY ANGIOPLASTY  2006   CYSTOSCOPY W/ RETROGRADES Bilateral 07/01/2021   Procedure: CYSTOSCOPY WITH RETROGRADE PYELOGRAM;  Surgeon: Belva Agee, MD;  Location: [facility];  Service: Urology;  Laterality: Bilateral;   ESOPHAGOGASTRODUODENOSCOPY (EGD) WITH PROPOFOL N/A 11/28/2019   Procedure: ESOPHAGOGASTRODUODENOSCOPY (EGD) WITH PROPOFOL;  Surgeon: Vida Rigger, MD;  Location: [facility]OSCOPY;  Service: Endoscopy;  Laterality: N/A;   EYE SURGERY Right 2018   right eye cataract   MULTIPLE TOOTH EXTRACTIONS     all teeth pulled   TRANSURETHRAL RESECTION OF BLADDER TUMOR N/A 07/01/2021   Procedure: TRANSURETHRAL RESECTION OF BLADDER TUMOR (TURBT);  Surgeon: Belva Agee, MD;  Location: [facility];  Service: Urology;  Laterality: N/A;   Family History  Problem Relation Age of Onset   Cancer Mother    Heart disease Father    Diabetes Father    Stroke Father    Breast cancer Sister    Social History   Socioeconomic History   Marital status: Legally Separated    Spouse name: Not on file   Number of children: Not on file   Years of education: Not on file   Highest education level: Not on file  Occupational History   Not on file  Tobacco Use   Smoking status: Former    Current packs/day: 0.00    Average packs/day: 0.3 packs/day for 55.0 years (13.8 ttl pk-yrs)    Types: Cigarettes    Start date: 10/08/1967    Quit date: 10/07/2022    Years since quitting: 0.8   Smokeless tobacco: Never   Tobacco comments:    Now 1 pack per week ( 2-3 per day) for last year as of 06-18-2021  Vaping Use   Vaping status: Never Used  Substance and Sexual Activity   Alcohol use: Yes    Alcohol/week: 8.0 standard drinks of alcohol    Types:  8 Glasses of wine per week    Comment: 2-3 glasses of wine a day   Drug use: No   Sexual activity: Not Currently  Other Topics Concern   Not on file  Social History Narrative   Not on file   Social Determinants of Health   Financial Resource Strain: Low Risk  (07/26/2023)   Overall Financial Resource Strain (CARDIA)    Difficulty of Paying Living Expenses: Not hard at all  Food Insecurity: No Food Insecurity (07/26/2023)   Hunger Vital Sign    Worried About Running Out of Food in the Last Year: Never true    Ran Out of Food in  the Last Year: Never true  Transportation Needs: No Transportation Needs (07/26/2023)   PRAPARE - Administrator, Civil Service (Medical): No    Lack of Transportation (Non-Medical): No  Physical Activity: Insufficiently Active (07/26/2023)   Exercise Vital Sign    Days of Exercise per Week: 3 days    Minutes of Exercise per Session: 30 min  Stress: No Stress Concern Present (07/26/2023)   [employer] of Occupational Health - Occupational Stress Questionnaire    Feeling of Stress : Not at all  Social Connections: Moderately Isolated (07/26/2023)   Social Connection and Isolation Panel [NHANES]    Frequency of Communication with Friends and Family: More than three times a week    Frequency of Social Gatherings with Friends and Family: Three times a week    Attends Religious Services: 1 to 4 times per year    Active Member of Clubs or Organizations: No    Attends Banker Meetings: Never    Marital Status: Divorced    Tobacco Counseling Counseling given: Not Answered Tobacco comments: Now 1 pack per week ( 2-3 per day) for last year as of 06-18-2021   Clinical Intake:  Pre-visit preparation completed: Yes  Pain : No/denies pain     Diabetes: No  How often do you need to have someone help you when you read instructions, pamphlets, or other written materials from your doctor or pharmacy?: 1 - Never  Interpreter Needed?:  No  Information entered by :: Craig Fantasia LPN   Activities of Daily Living    07/26/2023    6:30 PM  In your present state of health, do you have any difficulty performing the following activities:  Hearing? 0  Vision? 0  Difficulty concentrating or making decisions? 0  Walking or climbing stairs? 0  Dressing or bathing? 0  Doing errands, shopping? 0  Preparing Food and eating ? N  Using the Toilet? N  In the past six months, have you accidently leaked urine? N  Do you have problems with loss of bowel control? N  Managing your Medications? N  Managing your Finances? N  Housekeeping or managing your Housekeeping? N    Patient Care Team: Celine Mans, MD as PCP - General (Family Medicine) Meriam Sprague, MD as PCP - Cardiology (Cardiology) Belva Agee, MD as Consulting Physician (Urology)  Indicate any recent Medical Services you may have received from other than [facility] providers in the past year (date may be approximate).     Assessment:   This is a routine wellness examination for Craig Moore.  Hearing/Vision screen Hearing Screening - Comments:: Denies hearing difficulties   Vision Screening - Comments:: No vision problems; Branch schedule routine eye exam soon    Dietary issues and exercise activities discussed:     Goals Addressed             This Visit's Progress    Remain active and independent         Depression Screen    07/26/2023    6:34 PM 02/11/2023    3:32 PM 09/03/2022    1:41 PM 11/18/2021   11:01 AM 08/19/2021    2:58 PM 07/16/2021   11:25 AM 06/12/2021    1:40 PM  PHQ 2/9 Scores  PHQ - 2 Score 0 0 0 0 0 0 0  PHQ- 9 Score  0 1 2 0 2 0    Fall Risk    07/26/2023    6:39 PM 02/11/2023  3:32 PM 09/03/2022    1:42 PM 08/19/2021    2:58 PM 07/16/2021   11:25 AM  Fall Risk   Falls in the past year? 0 0 0 0 0  Number falls in past yr: 0  0 0 0  Injury with Fall? 0  0  0  Risk for fall due to : No Fall Risks      Follow up Falls  prevention discussed;Education provided;Falls evaluation completed        MEDICARE RISK AT HOME: Medicare Risk at Home Any stairs in or around the home?: No If so, are there any without handrails?: No Home free of loose throw rugs in walkways, pet beds, electrical cords, etc?: Yes Adequate lighting in your home to reduce risk of falls?: Yes Life alert?: No Use of a cane, walker or w/c?: No Grab bars in the bathroom?: Yes Shower chair or bench in shower?: No Elevated toilet seat or a handicapped toilet?: Yes  TIMED UP AND GO:  Was the test performed? No    Cognitive Function:        07/26/2023    6:45 PM  6CIT Screen  What Year? 0 points  What month? 0 points  What time? 0 points  Count back from 20 0 points  Months in reverse 2 points  Repeat phrase 0 points  Total Score 2 points    Immunizations Immunization History  Administered Date(s) Administered   Fluad Quad(high Dose 65+) 10/26/2019   Influenza Inj Mdck Quad With Preservative 11/12/2019   Influenza, High Dose Seasonal PF 09/02/2020   Influenza,inj,Quad PF,6+ Mos 08/25/2018   Influenza-Unspecified 10/07/2021   PFIZER(Purple Top)SARS-COV-2 Vaccination 01/20/2020, 02/11/2020   Pneumococcal Conjugate-13 03/23/2014   Pneumococcal Polysaccharide-23 08/25/2018    TDAP status: Due, Education has been provided regarding the importance of this vaccine. Advised may receive this vaccine at local pharmacy or Health Dept. Aware to provide a copy of the vaccination record if obtained from local pharmacy or Health Dept. Verbalized acceptance and understanding.  Flu Vaccine status: Due, Education has been provided regarding the importance of this vaccine. Advised may receive this vaccine at local pharmacy or Health Dept. Aware to provide a copy of the vaccination record if obtained from local pharmacy or Health Dept. Verbalized acceptance and understanding.  Pneumococcal vaccine status: Up to date  Covid-19 vaccine status:  Information provided on how to obtain vaccines.   Qualifies for Shingles Vaccine? Yes   Zostavax completed No   Shingrix Completed?: No.    Education has been provided regarding the importance of this vaccine. Patient has been advised to call insurance company to determine out of pocket expense if they have not yet received this vaccine. Advised may also receive vaccine at local pharmacy or Health Dept. Verbalized acceptance and understanding.  Screening Tests Health Maintenance  Topic Date Due   DTaP/Tdap/Td (1 - Tdap) Never done   Zoster Vaccines- Shingrix (1 of 2) Never done   COVID-19 Vaccine (3 - 2023-24 season) 08/07/2022   INFLUENZA VACCINE  07/08/2023   Medicare Annual Wellness (AWV)  07/25/2024   Pneumonia Vaccine 37+ Years old  Completed   HPV VACCINES  Aged Out   Hepatitis C Screening  Discontinued    Health Maintenance  Health Maintenance Due  Topic Date Due   DTaP/Tdap/Td (1 - Tdap) Never done   Zoster Vaccines- Shingrix (1 of 2) Never done   COVID-19 Vaccine (3 - 2023-24 season) 08/07/2022   INFLUENZA VACCINE  07/08/2023  Colorectal cancer screening: No longer required.   Lung Cancer Screening: (Low Dose CT Chest recommended if Age 110-80 years, 20 pack-year currently smoking OR have quit w/in 15years.) does not qualify.   Lung Cancer Screening Referral: n/a  Additional Screening:  Hepatitis C Screening: does not qualify;  Vision Screening: Recommended annual ophthalmology exams for early detection of glaucoma and other disorders of the eye. Is the patient up to date with their annual eye exam?  No  Who is the provider or what is the name of the office in which the patient attends annual eye exams? none If pt is not established with a provider, would they like to be referred to a provider to establish care? No .   Dental Screening: Recommended annual dental exams for proper oral hygiene  Community Resource Referral / Chronic Care Management: CRR required  this visit?  No   CCM required this visit?  No    Plan:     I have personally reviewed and noted the following in the patient's chart:   Medical and social history Use of alcohol, tobacco or illicit drugs  Current medications and supplements including opioid prescriptions. Patient is not currently taking opioid prescriptions. Functional ability and status Nutritional status Physical activity Advanced directives List of other physicians Hospitalizations, surgeries, and ER visits in previous 12 months Vitals Screenings to include cognitive, depression, and falls Referrals and appointments  In addition, I have reviewed and discussed with patient certain preventive protocols, quality metrics, and best practice recommendations. A written personalized care plan for preventive services as well as general preventive health recommendations were provided to patient.     Craig Moore, [place]   1[birth year]   After Visit Summary: (Mail) Due to this being a telephonic visit, the after visit summary with patients personalized plan was offered to patient via mail   Nurse Notes: Patient is asking if PCP Craig Moore take over rx for Finasteride that was previously prescribed by urology.

## 2023-07-26 NOTE — Patient Instructions (Signed)
Mr. Craig Moore , Thank you for taking time to come for your Medicare Wellness Visit. I appreciate your ongoing commitment to your health goals. Please review the following plan we discussed and let me know if I can assist you in the future.   Referrals/Orders/Follow-Ups/Clinician Recommendations: Aim for 30 minutes of exercise or brisk walking, 6-8 glasses of water, and 5 servings of fruits and vegetables each day.  This is a list of the screening recommended for you and due dates:  Health Maintenance  Topic Date Due   DTaP/Tdap/Td vaccine (1 - Tdap) Never done   Zoster (Shingles) Vaccine (1 of 2) Never done   COVID-19 Vaccine (3 - 2023-24 season) 08/07/2022   Flu Shot  07/08/2023   Medicare Annual Wellness Visit  07/25/2024   Pneumonia Vaccine  Completed   HPV Vaccine  Aged Out   Hepatitis C Screening  Discontinued    Advanced directives: (ACP Link)Information on Advanced Care Planning can be found at [facility] Advance Health Care Directives Advance Health Care Directives ([email or web address]   Next Medicare Annual Wellness Visit scheduled for next year: Yes  Preventive Care 65 Years and Older, Male  Preventive care refers to lifestyle choices and visits with your health care provider that can promote health and wellness. What does preventive care include? A yearly physical exam. This is also called an annual well check. Dental exams once or twice a year. Routine eye exams. Ask your health care provider how often you should have your eyes checked. Personal lifestyle choices, including: Daily care of your teeth and gums. Regular physical activity. Eating a healthy diet. Avoiding tobacco and drug use. Limiting alcohol use. Practicing safe sex. Taking low doses of aspirin every day. Taking vitamin and mineral supplements as recommended by your health care provider. What happens during an annual well check? The services and screenings done by your health care provider  during your annual well check Deondra depend on your age, overall health, lifestyle risk factors, and family history of disease. Counseling  Your health care provider may ask you questions about your: Alcohol use. Tobacco use. Drug use. Emotional well-being. Home and relationship well-being. Sexual activity. Eating habits. History of falls. Memory and ability to understand (cognition). Work and work Astronomer. Screening  You may have the following tests or measurements: Height, weight, and BMI. Blood pressure. Lipid and cholesterol levels. These may be checked every 5 years, or more frequently if you are over 74 years old. Skin check. Lung cancer screening. You may have this screening every year starting at age 71 if you have a 30-pack-year history of smoking and currently smoke or have quit within the past 15 years. Fecal occult blood test (FOBT) of the stool. You may have this test every year starting at age 59. Flexible sigmoidoscopy or colonoscopy. You may have a sigmoidoscopy every 5 years or a colonoscopy every 10 years starting at age 37. Prostate cancer screening. Recommendations Torre vary depending on your family history and other risks. Hepatitis C blood test. Hepatitis B blood test. Sexually transmitted disease (STD) testing. Diabetes screening. This is done by checking your blood sugar (glucose) after you have not eaten for a while (fasting). You may have this done every 1-3 years. Abdominal aortic aneurysm (AAA) screening. You may need this if you are a current or former smoker. Osteoporosis. You may be screened starting at age 54 if you are at high risk. Talk with your health care provider about your test results, treatment options,  and if necessary, the need for more tests. Vaccines  Your health care provider may recommend certain vaccines, such as: Influenza vaccine. This is recommended every year. Tetanus, diphtheria, and acellular pertussis (Tdap, Td) vaccine. You  may need a Td booster every 10 years. Zoster vaccine. You may need this after age 49. Pneumococcal 13-valent conjugate (PCV13) vaccine. One dose is recommended after age 18. Pneumococcal polysaccharide (PPSV23) vaccine. One dose is recommended after age 52. Talk to your health care provider about which screenings and vaccines you need and how often you need them. This information is not intended to replace advice given to you by your health care provider. Make sure you discuss any questions you have with your health care provider. Document Released: 12/20/2015 Document Revised: 08/12/2016 Document Reviewed: 09/24/2015 Elsevier Interactive Patient Education  2017 [employer].  Fall Prevention in the Home Falls can cause injuries. They can happen to people of all ages. There are many things you can do to make your home safe and to help prevent falls. What can I do on the outside of my home? Regularly fix the edges of walkways and driveways and fix any cracks. Remove anything that might make you trip as you walk through a door, such as a raised step or threshold. Trim any bushes or trees on the path to your home. Use bright outdoor lighting. Clear any walking paths of anything that might make someone trip, such as rocks or tools. Regularly check to see if handrails are loose or broken. Make sure that both sides of any steps have handrails. Any raised decks and porches should have guardrails on the edges. Have any leaves, snow, or ice cleared regularly. Use sand or salt on walking paths during winter. Clean up any spills in your garage right away. This includes oil or grease spills. What can I do in the bathroom? Use night lights. Install grab bars by the toilet and in the tub and shower. Do not use towel bars as grab bars. Use non-skid mats or decals in the tub or shower. If you need to sit down in the shower, use a plastic, non-slip stool. Keep the floor dry. Clean up any water that spills  on the floor as soon as it happens. Remove soap buildup in the tub or shower regularly. Attach bath mats securely with double-sided non-slip rug tape. Do not have throw rugs and other things on the floor that can make you trip. What can I do in the bedroom? Use night lights. Make sure that you have a light by your bed that is easy to reach. Do not use any sheets or blankets that are too big for your bed. They should not hang down onto the floor. Have a firm chair that has side arms. You can use this for support while you get dressed. Do not have throw rugs and other things on the floor that can make you trip. What can I do in the kitchen? Clean up any spills right away. Avoid walking on wet floors. Keep items that you use a lot in easy-to-reach places. If you need to reach something above you, use a strong step stool that has a grab bar. Keep electrical cords out of the way. Do not use floor polish or wax that makes floors slippery. If you must use wax, use non-skid floor wax. Do not have throw rugs and other things on the floor that can make you trip. What can I do with my stairs? Do not leave  any items on the stairs. Make sure that there are handrails on both sides of the stairs and use them. Fix handrails that are broken or loose. Make sure that handrails are as long as the stairways. Check any carpeting to make sure that it is firmly attached to the stairs. Fix any carpet that is loose or worn. Avoid having throw rugs at the top or bottom of the stairs. If you do have throw rugs, attach them to the floor with carpet tape. Make sure that you have a light switch at the top of the stairs and the bottom of the stairs. If you do not have them, ask someone to add them for you. What else can I do to help prevent falls? Wear shoes that: Do not have high heels. Have rubber bottoms. Are comfortable and fit you well. Are closed at the toe. Do not wear sandals. If you use a stepladder: Make  sure that it is fully opened. Do not climb a closed stepladder. Make sure that both sides of the stepladder are locked into place. Ask someone to hold it for you, if possible. Clearly mark and make sure that you can see: Any grab bars or handrails. First and last steps. Where the edge of each step is. Use tools that help you move around (mobility aids) if they are needed. These include: Canes. Walkers. Scooters. Crutches. Turn on the lights when you go into a dark area. Replace any light bulbs as soon as they burn out. Set up your furniture so you have a clear path. Avoid moving your furniture around. If any of your floors are uneven, fix them. If there are any pets around you, be aware of where they are. Review your medicines with your doctor. Some medicines can make you feel dizzy. This can increase your chance of falling. Ask your doctor what other things that you can do to help prevent falls. This information is not intended to replace advice given to you by your health care provider. Make sure you discuss any questions you have with your health care provider. Document Released: 09/19/2009 Document Revised: 04/30/2016 Document Reviewed: 12/28/2014 Elsevier Interactive Patient Education  2017 [employer].

## 2023-08-03 ENCOUNTER — Other Ambulatory Visit: Payer: Self-pay | Admitting: Family Medicine

## 2023-08-03 DIAGNOSIS — Z8719 Personal history of other diseases of the digestive system: Secondary | ICD-10-CM

## 2023-08-03 DIAGNOSIS — I82503 Chronic embolism and thrombosis of unspecified deep veins of lower extremity, bilateral: Secondary | ICD-10-CM

## 2023-08-07 ENCOUNTER — Other Ambulatory Visit: Payer: Self-pay | Admitting: Family Medicine

## 2023-08-07 DIAGNOSIS — I10 Essential (primary) hypertension: Secondary | ICD-10-CM

## 2023-08-08 ENCOUNTER — Other Ambulatory Visit: Payer: Self-pay | Admitting: Family Medicine

## 2023-08-08 DIAGNOSIS — I10 Essential (primary) hypertension: Secondary | ICD-10-CM

## 2023-08-30 ENCOUNTER — Other Ambulatory Visit: Payer: Self-pay | Admitting: Family Medicine

## 2023-12-27 ENCOUNTER — Other Ambulatory Visit: Payer: Self-pay | Admitting: Physician Assistant

## 2023-12-27 ENCOUNTER — Other Ambulatory Visit: Payer: Self-pay | Admitting: Family Medicine

## 2023-12-27 DIAGNOSIS — R0609 Other forms of dyspnea: Secondary | ICD-10-CM

## 2023-12-27 DIAGNOSIS — I10 Essential (primary) hypertension: Secondary | ICD-10-CM

## 2023-12-27 DIAGNOSIS — I2510 Atherosclerotic heart disease of native coronary artery without angina pectoris: Secondary | ICD-10-CM

## 2023-12-27 DIAGNOSIS — E785 Hyperlipidemia, unspecified: Secondary | ICD-10-CM

## 2023-12-27 DIAGNOSIS — I119 Hypertensive heart disease without heart failure: Secondary | ICD-10-CM

## 2024-01-21 ENCOUNTER — Other Ambulatory Visit: Payer: Self-pay | Admitting: Physician Assistant

## 2024-01-21 DIAGNOSIS — I2510 Atherosclerotic heart disease of native coronary artery without angina pectoris: Secondary | ICD-10-CM

## 2024-01-21 DIAGNOSIS — E785 Hyperlipidemia, unspecified: Secondary | ICD-10-CM

## 2024-01-21 DIAGNOSIS — I10 Essential (primary) hypertension: Secondary | ICD-10-CM

## 2024-01-21 DIAGNOSIS — I119 Hypertensive heart disease without heart failure: Secondary | ICD-10-CM

## 2024-01-21 DIAGNOSIS — R0609 Other forms of dyspnea: Secondary | ICD-10-CM

## 2024-01-25 ENCOUNTER — Other Ambulatory Visit: Payer: Self-pay | Admitting: Physician Assistant

## 2024-01-25 DIAGNOSIS — I2510 Atherosclerotic heart disease of native coronary artery without angina pectoris: Secondary | ICD-10-CM

## 2024-01-25 DIAGNOSIS — E785 Hyperlipidemia, unspecified: Secondary | ICD-10-CM

## 2024-01-25 DIAGNOSIS — I10 Essential (primary) hypertension: Secondary | ICD-10-CM

## 2024-01-25 DIAGNOSIS — I119 Hypertensive heart disease without heart failure: Secondary | ICD-10-CM

## 2024-01-25 DIAGNOSIS — R0609 Other forms of dyspnea: Secondary | ICD-10-CM

## 2024-02-04 ENCOUNTER — Other Ambulatory Visit: Payer: Self-pay | Admitting: Physician Assistant

## 2024-02-04 DIAGNOSIS — R0609 Other forms of dyspnea: Secondary | ICD-10-CM

## 2024-02-04 DIAGNOSIS — E785 Hyperlipidemia, unspecified: Secondary | ICD-10-CM

## 2024-02-04 DIAGNOSIS — I2510 Atherosclerotic heart disease of native coronary artery without angina pectoris: Secondary | ICD-10-CM

## 2024-02-04 DIAGNOSIS — I119 Hypertensive heart disease without heart failure: Secondary | ICD-10-CM

## 2024-02-04 DIAGNOSIS — I10 Essential (primary) hypertension: Secondary | ICD-10-CM

## 2024-02-13 ENCOUNTER — Other Ambulatory Visit: Payer: Self-pay | Admitting: Family Medicine

## 2024-02-13 DIAGNOSIS — I82503 Chronic embolism and thrombosis of unspecified deep veins of lower extremity, bilateral: Secondary | ICD-10-CM

## 2024-03-06 NOTE — Telephone Encounter (Signed)
 Marland Kitchen

## 2024-04-10 ENCOUNTER — Other Ambulatory Visit: Payer: Self-pay | Admitting: Family Medicine

## 2024-04-10 DIAGNOSIS — Z8719 Personal history of other diseases of the digestive system: Secondary | ICD-10-CM

## 2024-05-12 ENCOUNTER — Other Ambulatory Visit: Payer: Self-pay

## 2024-05-12 DIAGNOSIS — I82503 Chronic embolism and thrombosis of unspecified deep veins of lower extremity, bilateral: Secondary | ICD-10-CM

## 2024-05-15 MED ORDER — RIVAROXABAN 20 MG PO TABS: 20.0000 mg | ORAL_TABLET | Freq: Every day | ORAL | 0 refills | Status: DC

## 2024-05-30 ENCOUNTER — Ambulatory Visit: Admitting: Family Medicine

## 2024-05-30 VITALS — BP 151/69 | HR 97 | Ht 69.0 in | Wt 143.8 lb

## 2024-05-30 DIAGNOSIS — N1832 Chronic kidney disease, stage 3b: Secondary | ICD-10-CM

## 2024-05-30 DIAGNOSIS — E785 Hyperlipidemia, unspecified: Secondary | ICD-10-CM | POA: Diagnosis not present

## 2024-05-30 DIAGNOSIS — I82503 Chronic embolism and thrombosis of unspecified deep veins of lower extremity, bilateral: Secondary | ICD-10-CM

## 2024-05-30 DIAGNOSIS — K8689 Other specified diseases of pancreas: Secondary | ICD-10-CM | POA: Diagnosis not present

## 2024-05-30 DIAGNOSIS — I10 Essential (primary) hypertension: Secondary | ICD-10-CM | POA: Diagnosis not present

## 2024-05-30 MED ORDER — RIVAROXABAN 20 MG PO TABS: 20.0000 mg | ORAL_TABLET | Freq: Every day | ORAL | 1 refills | Status: DC

## 2024-05-30 MED ORDER — INDAPAMIDE 2.5 MG PO TABS: 2.5000 mg | ORAL_TABLET | Freq: Every day | ORAL | 1 refills | Status: DC

## 2024-05-30 NOTE — Assessment & Plan Note (Signed)
Repeat CMP today 

## 2024-05-30 NOTE — Assessment & Plan Note (Signed)
 Repeat lipid panel today, continue atorvastatin .

## 2024-05-30 NOTE — Assessment & Plan Note (Signed)
 Patient has declined MRI surveillance up until today.  MRI with and without contrast of the abdomen ordered.

## 2024-05-30 NOTE — Patient Instructions (Addendum)
 It was great to see you! Thank you for allowing me to participate in your care!  Our plans for today:  - You should get a call to schedule your MRI of your abdomen. - I Undra let you know the results of labs. - Please continue take your Xarelto  I have refilled this. - Please continue taking indapamide . - Please let me know if you require any refills.   Please arrive 15 minutes PRIOR to your next scheduled appointment time! If you do not, this affects OTHER patients' care.  Take care and seek immediate care sooner if you develop any concerns.   Ozell Provencal, MD, PGY-2 [facility] 2:08 PM 05/30/2024  [facility]

## 2024-05-30 NOTE — Assessment & Plan Note (Signed)
 Systolically mildly elevated.  Patient currently taking indapamide .  Patient requested to continue blood pressure log at home prior to starting new medicine.  Would start start low-dose amlodipine  2.5 mg follow-up as he has had hypotensive episodes in the past.

## 2024-05-30 NOTE — Progress Notes (Signed)
    SUBJECTIVE:   CHIEF COMPLAINT / HPI: check-up  States he is here for check-up. Needs refill of check-up.  Med Rec - Taking everything except for taking Nifedepine.  Bladder cancer - [facility] Last visit 03/28. Cystoscopy. Was told bladder looked good. Still sometimes has blood in urine.  PERTINENT  PMH / PSH: Hypertension, CAD, history of repeat DVTs, CHF, COPD, CKD3  OBJECTIVE:   BP (!) 152/84   Pulse 97   Ht 5' 9 (1.753 m)   Wt 143 lb 12.8 oz (65.2 kg)   SpO2 99%   BMI 21.24 kg/m   General: NAD, well appearing Neuro: A&O Cardiovascular: RRR, no murmurs, no peripheral edema Respiratory: normal WOB on RA, CTAB, no wheezes, ronchi or rales Abdomen: soft, NTTP, no rebound or guarding Extremities: Moving all 4 extremities equally   ASSESSMENT/PLAN:   Assessment & Plan Pancreatic mass Patient has declined MRI surveillance up until today.  MRI with and without contrast of the abdomen ordered. 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  CBC today. Primary hypertension Systolically mildly elevated.  Patient currently taking indapamide .  Patient requested to continue blood pressure log at home prior to starting new medicine.  Would start start low-dose amlodipine  2.5 mg follow-up as he has had hypotensive episodes in the past. Dyslipidemia Repeat lipid panel today, continue atorvastatin . Stage 3b chronic kidney disease (HCC) Repeat CMP today.  Return in about 6 months (around 11/29/2024) for Follow-up.  Craig Provencal, MD [facility]

## 2024-05-30 NOTE — Assessment & Plan Note (Signed)
 Refilled Xarelto , no symptoms of current DVT.  CBC today.

## 2024-05-31 ENCOUNTER — Telehealth: Payer: Self-pay

## 2024-05-31 ENCOUNTER — Ambulatory Visit: Payer: Self-pay | Admitting: Family Medicine

## 2024-05-31 DIAGNOSIS — K8689 Other specified diseases of pancreas: Secondary | ICD-10-CM

## 2024-05-31 LAB — COMPREHENSIVE METABOLIC PANEL WITH GFR
ALT: 18 IU/L (ref 0–44)
AST: 24 IU/L (ref 0–40)
Albumin: 4.3 g/dL (ref 3.7–4.7)
Alkaline Phosphatase: 169 IU/L — ABNORMAL HIGH (ref 44–121)
BUN/Creatinine Ratio: 9 — ABNORMAL LOW (ref 10–24)
BUN: 15 mg/dL (ref 8–27)
Bilirubin Total: 0.6 mg/dL (ref 0.0–1.2)
CO2: 21 mmol/L (ref 20–29)
Calcium: 9.3 mg/dL (ref 8.6–10.2)
Chloride: 98 mmol/L (ref 96–106)
Creatinine, Ser: 1.74 mg/dL — ABNORMAL HIGH (ref 0.76–1.27)
Globulin, Total: 3 g/dL (ref 1.5–4.5)
Glucose: 101 mg/dL — ABNORMAL HIGH (ref 70–99)
Potassium: 5.1 mmol/L (ref 3.5–5.2)
Sodium: 136 mmol/L (ref 134–144)
Total Protein: 7.3 g/dL (ref 6.0–8.5)
eGFR: 39 mL/min/{1.73_m2} — ABNORMAL LOW (ref >59)

## 2024-05-31 LAB — CBC WITH DIFFERENTIAL/PLATELET
Basophils Absolute: 0 10*3/uL (ref 0.0–0.2)
Basos: 1 %
EOS (ABSOLUTE): 0.2 10*3/uL (ref 0.0–0.4)
Eos: 3 %
Hematocrit: 43.8 % (ref 37.5–51.0)
Hemoglobin: 14.8 g/dL (ref 13.0–17.7)
Immature Grans (Abs): 0 10*3/uL (ref 0.0–0.1)
Immature Granulocytes: 0 %
Lymphocytes Absolute: 0.9 10*3/uL (ref 0.7–3.1)
Lymphs: 17 %
MCH: 34.9 pg — ABNORMAL HIGH (ref 26.6–33.0)
MCHC: 33.8 g/dL (ref 31.5–35.7)
MCV: 103 fL — ABNORMAL HIGH (ref 79–97)
Monocytes Absolute: 0.5 10*3/uL (ref 0.1–0.9)
Monocytes: 9 %
Neutrophils Absolute: 3.7 10*3/uL (ref 1.4–7.0)
Neutrophils: 70 %
Platelets: 246 10*3/uL (ref 150–450)
RBC: 4.24 x10E6/uL (ref 4.14–5.80)
RDW: 12 % (ref 11.6–15.4)
WBC: 5.3 10*3/uL (ref 3.4–10.8)

## 2024-05-31 LAB — LIPID PANEL
Chol/HDL Ratio: 1.8 ratio (ref 0.0–5.0)
Cholesterol, Total: 181 mg/dL (ref 100–199)
HDL: 101 mg/dL (ref >39)
LDL Chol Calc (NIH): 66 mg/dL (ref 0–99)
Triglycerides: 77 mg/dL (ref 0–149)
VLDL Cholesterol Cal: 14 mg/dL (ref 5–40)

## 2024-05-31 NOTE — Telephone Encounter (Signed)
-----   Message from [facility] Reena S sent at 05/31/2024 11:17 AM EDT ----- Ok to schedule MRI at [facility]

## 2024-05-31 NOTE — Telephone Encounter (Signed)
 Spoke with patient. Informed of MRI at [facility] July 2nd at 1:45. Patient is not to have any food or drink 4 hours before appointment. Patient understood. Nelson Land, CMA

## 2024-06-07 ENCOUNTER — Ambulatory Visit (HOSPITAL_COMMUNITY)
Admission: RE | Admit: 2024-06-07 | Discharge: 2024-06-07 | Disposition: A | Discharge status: Home / Self Care | Source: Ambulatory Visit | Attending: Family Medicine | Admitting: Family Medicine

## 2024-06-07 DIAGNOSIS — R1900 Intra-abdominal and pelvic swelling, mass and lump, unspecified site: Secondary | ICD-10-CM | POA: Diagnosis present

## 2024-06-07 DIAGNOSIS — K8689 Other specified diseases of pancreas: Secondary | ICD-10-CM | POA: Insufficient documentation

## 2024-06-07 MED ORDER — GADOBUTROL 1 MMOL/ML IV SOLN
6.5000 mL | Freq: Once | INTRAVENOUS | Status: AC | PRN
  Administered 2024-06-07: 6.5 mL via INTRAVENOUS

## 2024-06-08 ENCOUNTER — Other Ambulatory Visit: Payer: Self-pay | Admitting: Family Medicine

## 2024-06-08 DIAGNOSIS — K8689 Other specified diseases of pancreas: Secondary | ICD-10-CM

## 2024-06-08 DIAGNOSIS — R1900 Intra-abdominal and pelvic swelling, mass and lump, unspecified site: Secondary | ICD-10-CM

## 2024-06-15 NOTE — Telephone Encounter (Signed)
 Called patient regarding MRI abdomen results.  Unchanged pancreatic nodule since prior MRI in 2021.  No further follow-up is recommended for pancreatic nodules given patient age and lack of symptoms and no change over nearly 10 years.  Discussed this with patient, noted to let us  know if he has any new GI symptoms.

## 2024-06-29 ENCOUNTER — Other Ambulatory Visit: Payer: Self-pay | Admitting: Physician Assistant

## 2024-06-29 DIAGNOSIS — I119 Hypertensive heart disease without heart failure: Secondary | ICD-10-CM

## 2024-06-29 DIAGNOSIS — E785 Hyperlipidemia, unspecified: Secondary | ICD-10-CM

## 2024-06-29 DIAGNOSIS — R0609 Other forms of dyspnea: Secondary | ICD-10-CM

## 2024-06-29 DIAGNOSIS — I2510 Atherosclerotic heart disease of native coronary artery without angina pectoris: Secondary | ICD-10-CM

## 2024-06-29 DIAGNOSIS — I10 Essential (primary) hypertension: Secondary | ICD-10-CM

## 2024-07-24 ENCOUNTER — Other Ambulatory Visit: Payer: Self-pay | Admitting: Family Medicine

## 2024-07-24 DIAGNOSIS — I10 Essential (primary) hypertension: Secondary | ICD-10-CM

## 2024-07-31 ENCOUNTER — Ambulatory Visit (INDEPENDENT_AMBULATORY_CARE_PROVIDER_SITE_OTHER): Payer: 59

## 2024-07-31 VITALS — Ht 69.0 in | Wt 145.0 lb

## 2024-07-31 DIAGNOSIS — Z0000 Encounter for general adult medical examination without abnormal findings: Secondary | ICD-10-CM

## 2024-07-31 NOTE — Patient Instructions (Addendum)
 Mr. Craig Moore , Thank you for taking time out of your busy schedule to complete your Annual Wellness Visit with me. I enjoyed our conversation and look forward to speaking with you again next year. I, as well as your care team,  appreciate your ongoing commitment to your health goals. Please review the following plan we discussed and let me know if I can assist you in the future. Your Game plan/ To Do List    Referrals: If you haven't heard from the office you've been referred to, please reach out to them at the phone provided.   Follow up Visits: We [place] see or speak with you next year for your Next Medicare AWV with our clinical staff Have you seen your provider in the last 6 months (3 months if uncontrolled diabetes)? Yes  Clinician Recommendations:  Aim for 30 minutes of exercise or brisk walking, 6-8 glasses of water , and 5 servings of fruits and vegetables each day.       This is a list of the screenings recommended for you:  Health Maintenance  Topic Date Due   DTaP/Tdap/Td vaccine (1 - Tdap) Never done   Zoster (Shingles) Vaccine (2 of 2) 06/13/2014   COVID-19 Vaccine (3 - 2024-25 season) 08/08/2023   Flu Shot  07/07/2024   Medicare Annual Wellness Visit  07/31/2025   Pneumococcal Vaccine for age over 88  Completed   HPV Vaccine  Aged Out   Meningitis B Vaccine  Aged Out   Hepatitis C Screening  Discontinued    Advanced directives: (Declined) Advance directive discussed with you today. Even though you declined this today, please call our office should you change your mind, and we can give you the proper paperwork for you to fill out. Advance Care Planning is important because it:  [x]  Makes sure you receive the medical care that is consistent with your values, goals, and preferences  [x]  It provides guidance to your family and loved ones and reduces their decisional burden about whether or not they are making the right decisions based on your wishes.  Follow the link provided  in your after visit summary or read over the paperwork we have mailed to you to help you started getting your Advance Directives in place. If you need assistance in completing these, please reach out to us  so that we can help you!  See attachments for Preventive Care and Fall Prevention Tips.

## 2024-07-31 NOTE — Progress Notes (Signed)
 Because this visit was a virtual/telehealth visit,  certain criteria was not obtained, such a blood pressure, CBG if applicable, and timed get up and go. Any medications not marked as taking were not mentioned during the medication reconciliation part of the visit. Any vitals not documented were not able to be obtained due to this being a telehealth visit or patient was unable to self-report a recent blood pressure reading due to a lack of equipment at home via telehealth. Vitals that have been documented are verbally provided by the patient.   Subjective:   Craig Moore is a 83 y.o. who presents for a Medicare Wellness preventive visit.  As a reminder, Annual Wellness Visits don't include a physical exam, and some assessments may be limited, especially if this visit is performed virtually. We may recommend an in-person follow-up visit with your provider if needed.  Visit Complete: Virtual I connected with  Alquan Min on 07/31/24 by a audio enabled telemedicine application and verified that I am speaking with the correct person using two identifiers.  Patient Location: Home  Provider Location: Home Office  I discussed the limitations of evaluation and management by telemedicine. The patient expressed understanding and agreed to proceed.  Vital Signs: Because this visit was a virtual/telehealth visit, some criteria may be missing or patient reported. Any vitals not documented were not able to be obtained and vitals that have been documented are patient reported.  VideoDeclined- This patient declined Librarian, academic. Therefore the visit was completed with audio only.  Persons Participating in Visit: Patient.  AWV Questionnaire: No: Patient Medicare AWV questionnaire was not completed prior to this visit.  Cardiac Risk Factors include: advanced age (>58men, >28 women);sedentary lifestyle;dyslipidemia;family history of premature cardiovascular  disease;hypertension;male gender     Objective:    Today's Vitals   07/31/24 1423  Weight: 145 lb (65.8 kg)  Height: 5' 9 (1.753 m)  PainSc: 0-No pain   Body mass index is 21.41 kg/m.     07/31/2024    2:24 PM 05/30/2024    1:58 PM 07/26/2023    6:38 PM 02/11/2023    3:32 PM 09/03/2022    1:42 PM 08/19/2021    2:58 PM 07/16/2021   11:25 AM  Advanced Directives  Does Patient Have a Medical Advance Directive? No No No No No No No  Would patient like information on creating a medical advance directive? No - Patient declined No - Patient declined Yes (MAU/Ambulatory/Procedural Areas - Information given) No - Patient declined No - Patient declined No - Patient declined No - Patient declined    Current Medications (verified) Outpatient Encounter Medications as of 07/31/2024  Medication Sig   atorvastatin  (LIPITOR) 20 MG tablet TAKE 1 TABLET BY MOUTH EVERY DAY   b complex vitamins capsule Take 1 capsule by mouth daily.   docusate sodium (COLACE) 100 MG capsule Take 200 mg by mouth daily as needed for mild constipation.   ergocalciferol (VITAMIN D2) 1.25 MG (50000 UT) capsule Take 50,000 Units by mouth once a week.   finasteride (PROSCAR) 5 MG tablet Take 5 mg by mouth daily.   folic acid  (FOLVITE ) 1 MG tablet Take 1 mg by mouth daily.   furosemide  (LASIX ) 20 MG tablet TAKE 1 TAB DAILY AS NEEDED FOR DOE, SOB, EDEMA, OR WEIGHT GAIN >3 POUNDS OVERNIGHT OR 5 POUNDS/WK.   indapamide  (LOZOL ) 2.5 MG tablet Take 1 tablet (2.5 mg total) by mouth daily.   pantoprazole  (PROTONIX ) 40 MG tablet TAKE 1  TABLET BY MOUTH EVERY DAY   rivaroxaban  (XARELTO ) 20 MG TABS tablet Take 1 tablet (20 mg total) by mouth daily with supper.   sildenafil  (VIAGRA ) 100 MG tablet Take 0.5 tablets (50 mg total) by mouth daily as needed for erectile dysfunction.   No facility-administered encounter medications on file as of 07/31/2024.    Allergies (verified) Patient has no known allergies.   History: Past Medical  History:  Diagnosis Date   Arthritis    left shoulder   Bilateral renal cysts 01/05/2020   Bladder tumor 06/18/2021   CAD (coronary artery disease)    Non-STEMI May, 2006, bare-metal stent  //   nuclear, January, 2012, EF 70%, no ischemia   CHF (congestive heart failure) (HCC)    CKD (chronic kidney disease), stage III (HCC)    see dr gladis webb, ckd stage 3 lov dr webb 04-09-2021   Constipation 06/18/2021   COPD (chronic obstructive pulmonary disease) (HCC)    DVT (deep venous thrombosis) (HCC)    December, 2011 and 2016   Ejection fraction    EF 60-65% echo 08-17-2018   H/O: GI bleed 11/2019   History of blood transfusion 11/2019   1 unit given due to gi bleed   Hyperlipidemia    Hypertension    Myocardial infarction (HCC) 2006   Tobacco abuse    Wears dentures 06/18/2021   full dentures   Past Surgical History:  Procedure Laterality Date   BIOPSY  11/28/2019   Procedure: BIOPSY;  Surgeon: Rosalie Kitchens, MD;  Location: [facility] ENDOSCOPY;  Service: Endoscopy;;   CORONARY ANGIOPLASTY  2006   CYSTOSCOPY W/ RETROGRADES Bilateral 07/01/2021   Procedure: CYSTOSCOPY WITH RETROGRADE PYELOGRAM;  Surgeon: Rosalind Zachary NOVAK, MD;  Location: [facility];  Service: Urology;  Laterality: Bilateral;   ESOPHAGOGASTRODUODENOSCOPY (EGD) WITH PROPOFOL  N/A 11/28/2019   Procedure: ESOPHAGOGASTRODUODENOSCOPY (EGD) WITH PROPOFOL ;  Surgeon: Rosalie Kitchens, MD;  Location: [facility] ENDOSCOPY;  Service: Endoscopy;  Laterality: N/A;   EYE SURGERY Right 2018   right eye cataract   MULTIPLE TOOTH EXTRACTIONS     all teeth pulled   TRANSURETHRAL RESECTION OF BLADDER TUMOR N/A 07/01/2021   Procedure: TRANSURETHRAL RESECTION OF BLADDER TUMOR (TURBT);  Surgeon: Rosalind Zachary NOVAK, MD;  Location: [facility];  Service: Urology;  Laterality: N/A;   Family History  Problem Relation Age of Onset   Cancer Mother    Heart disease Father    Diabetes Father    Stroke Father    Breast cancer Sister     Social History   Socioeconomic History   Marital status: Legally Separated    Spouse name: Not on file   Number of children: Not on file   Years of education: Not on file   Highest education level: Not on file  Occupational History   Not on file  Tobacco Use   Smoking status: Former    Current packs/day: 0.00    Average packs/day: 0.3 packs/day for 55.0 years (13.8 ttl pk-yrs)    Types: Cigarettes    Start date: 10/08/1967    Quit date: 10/07/2022    Years since quitting: 1.8   Smokeless tobacco: Never   Tobacco comments:    Now 1 pack per week ( 2-3 per day) for last year as of 06-18-2021  Vaping Use   Vaping status: Never Used  Substance and Sexual Activity   Alcohol use: Yes    Alcohol/week: 8.0 standard drinks of alcohol    Types: 8 Glasses  of wine per week    Comment: 2-3 glasses of wine a day   Drug use: No   Sexual activity: Not Currently  Other Topics Concern   Not on file  Social History Narrative   Not on file   Social Drivers of Health   Financial Resource Strain: Low Risk  (07/31/2024)   Overall Financial Resource Strain (CARDIA)    Difficulty of Paying Living Expenses: Not hard at all  Food Insecurity: No Food Insecurity (07/31/2024)   Hunger Vital Sign    Worried About Running Out of Food in the Last Year: Never true    Ran Out of Food in the Last Year: Never true  Transportation Needs: No Transportation Needs (07/31/2024)   PRAPARE - Administrator, Civil Service (Medical): No    Lack of Transportation (Non-Medical): No  Physical Activity: Insufficiently Active (07/31/2024)   Exercise Vital Sign    Days of Exercise per Week: 3 days    Minutes of Exercise per Session: 30 min  Stress: No Stress Concern Present (07/31/2024)   [employer] of Occupational Health - Occupational Stress Questionnaire    Feeling of Stress: Not at all  Social Connections: Moderately Isolated (07/31/2024)   Social Connection and Isolation Panel    Frequency of  Communication with Friends and Family: More than three times a week    Frequency of Social Gatherings with Friends and Family: Three times a week    Attends Religious Services: 1 to 4 times per year    Active Member of Clubs or Organizations: No    Attends Banker Meetings: Never    Marital Status: Divorced    Tobacco Counseling Counseling given: Not Answered Tobacco comments: Now 1 pack per week ( 2-3 per day) for last year as of 06-18-2021    Clinical Intake:  Pre-visit preparation completed: Yes  Pain : No/denies pain Pain Score: 0-No pain     BMI - recorded: 21.41 Nutritional Status: BMI of 19-24  Normal Nutritional Risks: None Diabetes: No  Lab Results  Component Value Date   HGBA1C 5.3 11/08/2017   HGBA1C 4.7 08/19/2015     How often do you need to have someone help you when you read instructions, pamphlets, or other written materials from your doctor or pharmacy?: 1 - Never  Interpreter Needed?: No  Information entered by :: Sherran Margolis N. Russell Engelstad, LPN.   Activities of Daily Living     07/31/2024    2:26 PM  In your present state of health, do you have any difficulty performing the following activities:  Hearing? 0  Vision? 0  Difficulty concentrating or making decisions? 0  Walking or climbing stairs? 0  Dressing or bathing? 0  Doing errands, shopping? 0  Preparing Food and eating ? N  Using the Toilet? N  In the past six months, have you accidently leaked urine? N  Do you have problems with loss of bowel control? N  Managing your Medications? N  Managing your Finances? N  Housekeeping or managing your Housekeeping? N    Patient Care Team: Alba Sharper, MD as PCP - General (Family Medicine) Hobart Powell BRAVO, MD (Inactive) as PCP - Cardiology (Cardiology) Rosalind Zachary NOVAK, MD (Inactive) as Consulting Physician (Urology)  I have updated your Care Teams any recent Medical Services you may have received from other providers in the  past year.     Assessment:   This is a routine wellness examination for Durrell.  Hearing/Vision screen Hearing  Screening - Comments:: Adequate hearing. Vision Screening - Comments:: Adequate vision.  Wears reading glasses only. No vision in left eye, 20/20 in right eye.   Goals Addressed             This Visit's Progress    07/31/24: To increase physical activity to improve my golf game.         Depression Screen     07/31/2024    2:25 PM 05/30/2024    1:54 PM 07/26/2023    6:34 PM 02/11/2023    3:32 PM 09/03/2022    1:41 PM 11/18/2021   11:01 AM 08/19/2021    2:58 PM  PHQ 2/9 Scores  PHQ - 2 Score 0 0 0 0 0 0 0  PHQ- 9 Score 0 2  0 1 2 0    Fall Risk     07/31/2024    2:25 PM 05/30/2024    1:54 PM 07/26/2023    6:39 PM 02/11/2023    3:32 PM 09/03/2022    1:42 PM  Fall Risk   Falls in the past year? 0 0 0 0 0  Number falls in past yr: 0 0 0  0  Injury with Fall? 0 0 0  0  Risk for fall due to : No Fall Risks  No Fall Risks    Follow up Falls evaluation completed  Falls prevention discussed;Education provided;Falls evaluation completed      MEDICARE RISK AT HOME:  Medicare Risk at Home Any stairs in or around the home?: No If so, are there any without handrails?: No Home free of loose throw rugs in walkways, pet beds, electrical cords, etc?: Yes Adequate lighting in your home to reduce risk of falls?: Yes Life alert?: No Use of a cane, walker or w/c?: No Grab bars in the bathroom?: Yes Shower chair or bench in shower?: No Elevated toilet seat or a handicapped toilet?: Yes  TIMED UP AND GO:  Was the test performed?  No  Cognitive Function: Declined/Normal: No cognitive concerns noted by patient or family. Patient alert, oriented, able to answer questions appropriately and recall recent events. No signs of memory loss or confusion.    07/31/2024    2:25 PM  MMSE - Mini Mental State Exam  Not completed: Unable to complete        07/31/2024    2:25 PM 07/26/2023     6:45 PM  6CIT Screen  What Year? 0 points 0 points  What month? 0 points 0 points  What time? 0 points 0 points  Count back from 20 0 points 0 points  Months in reverse 0 points 2 points  Repeat phrase 0 points 0 points  Total Score 0 points 2 points    Immunizations Immunization History  Administered Date(s) Administered   Fluad Quad(high Dose 65+) 10/26/2019   INFLUENZA, HIGH DOSE SEASONAL PF 09/02/2020   Influenza Inj Mdck Quad With Preservative 11/12/2019   Influenza,inj,Quad PF,6+ Mos 08/25/2018   Influenza-Unspecified 10/07/2021   PFIZER(Purple Top)SARS-COV-2 Vaccination 01/20/2020, 02/11/2020   Pneumococcal Conjugate-13 03/23/2014   Pneumococcal Polysaccharide-23 08/25/2018   Zoster Recombinant(Shingrix) 04/18/2014    Screening Tests Health Maintenance  Topic Date Due   DTaP/Tdap/Td (1 - Tdap) Never done   Zoster Vaccines- Shingrix (2 of 2) 06/13/2014   COVID-19 Vaccine (3 - 2024-25 season) 08/08/2023   INFLUENZA VACCINE  07/07/2024   Medicare Annual Wellness (AWV)  07/31/2025   Pneumococcal Vaccine: 50+ Years  Completed   HPV VACCINES  Aged  Out   Meningococcal B Vaccine  Aged Out   Hepatitis C Screening  Discontinued    Health Maintenance  Health Maintenance Due  Topic Date Due   DTaP/Tdap/Td (1 - Tdap) Never done   Zoster Vaccines- Shingrix (2 of 2) 06/13/2014   COVID-19 Vaccine (3 - 2024-25 season) 08/08/2023   INFLUENZA VACCINE  07/07/2024   Health Maintenance Items Addressed: Yes Patient is due for the following care gaps: Immunizations for Covid-19, Dtap, Flu and Shingrix.  Additional Screening:  Vision Screening: Recommended annual ophthalmology exams for early detection of glaucoma and other disorders of the eye. Would you like a referral to an eye doctor? No    Dental Screening: Recommended annual dental exams for proper oral hygiene  Community Resource Referral / Chronic Care Management: CRR required this visit?  No   CCM required this  visit?  No   Plan:    I have personally reviewed and noted the following in the patient's chart:   Medical and social history Use of alcohol, tobacco or illicit drugs  Current medications and supplements including opioid prescriptions. Patient is not currently taking opioid prescriptions. Functional ability and status Nutritional status Physical activity Advanced directives List of other physicians Hospitalizations, surgeries, and ER visits in previous 12 months Vitals Screenings to include cognitive, depression, and falls Referrals and appointments  In addition, I have reviewed and discussed with patient certain preventive protocols, quality metrics, and best practice recommendations. A written personalized care plan for preventive services as well as general preventive health recommendations were provided to patient.   Roz LOISE Fuller, LPN   [id number]/[id number]   After Visit Summary: (Declined) Due to this being a telephonic visit, with patients personalized plan was offered to patient but patient Declined AVS at this time   Notes: Patient is due for the following care gaps: Immunizations for Covid-19, Dtap, Flu and Shingrix.

## 2024-08-06 ENCOUNTER — Other Ambulatory Visit: Payer: Self-pay | Admitting: Family Medicine

## 2024-08-21 ENCOUNTER — Other Ambulatory Visit: Payer: Self-pay | Admitting: Physician Assistant

## 2024-08-21 ENCOUNTER — Other Ambulatory Visit: Payer: Self-pay | Admitting: Family Medicine

## 2024-08-21 DIAGNOSIS — I2510 Atherosclerotic heart disease of native coronary artery without angina pectoris: Secondary | ICD-10-CM

## 2024-08-21 DIAGNOSIS — I119 Hypertensive heart disease without heart failure: Secondary | ICD-10-CM

## 2024-08-21 DIAGNOSIS — E785 Hyperlipidemia, unspecified: Secondary | ICD-10-CM

## 2024-08-21 DIAGNOSIS — I10 Essential (primary) hypertension: Secondary | ICD-10-CM

## 2024-08-21 DIAGNOSIS — R0609 Other forms of dyspnea: Secondary | ICD-10-CM

## 2024-08-21 DIAGNOSIS — Z8719 Personal history of other diseases of the digestive system: Secondary | ICD-10-CM

## 2024-08-30 ENCOUNTER — Other Ambulatory Visit: Payer: Self-pay | Admitting: Physician Assistant

## 2024-08-30 DIAGNOSIS — I2510 Atherosclerotic heart disease of native coronary artery without angina pectoris: Secondary | ICD-10-CM

## 2024-08-30 DIAGNOSIS — I119 Hypertensive heart disease without heart failure: Secondary | ICD-10-CM

## 2024-08-30 DIAGNOSIS — R0609 Other forms of dyspnea: Secondary | ICD-10-CM

## 2024-08-30 DIAGNOSIS — I10 Essential (primary) hypertension: Secondary | ICD-10-CM

## 2024-08-30 DIAGNOSIS — E785 Hyperlipidemia, unspecified: Secondary | ICD-10-CM

## 2024-09-29 ENCOUNTER — Other Ambulatory Visit: Payer: Self-pay | Admitting: Physician Assistant

## 2024-09-29 DIAGNOSIS — I119 Hypertensive heart disease without heart failure: Secondary | ICD-10-CM

## 2024-09-29 DIAGNOSIS — I10 Essential (primary) hypertension: Secondary | ICD-10-CM

## 2024-09-29 DIAGNOSIS — I2510 Atherosclerotic heart disease of native coronary artery without angina pectoris: Secondary | ICD-10-CM

## 2024-09-29 DIAGNOSIS — E785 Hyperlipidemia, unspecified: Secondary | ICD-10-CM

## 2024-09-29 DIAGNOSIS — R0609 Other forms of dyspnea: Secondary | ICD-10-CM

## 2024-10-04 ENCOUNTER — Other Ambulatory Visit: Payer: Self-pay | Admitting: Family Medicine

## 2024-10-04 DIAGNOSIS — I10 Essential (primary) hypertension: Secondary | ICD-10-CM

## 2024-10-12 ENCOUNTER — Other Ambulatory Visit: Payer: Self-pay | Admitting: Physician Assistant

## 2024-10-12 DIAGNOSIS — I119 Hypertensive heart disease without heart failure: Secondary | ICD-10-CM

## 2024-10-12 DIAGNOSIS — I2510 Atherosclerotic heart disease of native coronary artery without angina pectoris: Secondary | ICD-10-CM

## 2024-10-12 DIAGNOSIS — R0609 Other forms of dyspnea: Secondary | ICD-10-CM

## 2024-10-12 DIAGNOSIS — E785 Hyperlipidemia, unspecified: Secondary | ICD-10-CM

## 2024-10-12 DIAGNOSIS — I10 Essential (primary) hypertension: Secondary | ICD-10-CM

## 2024-10-23 ENCOUNTER — Telehealth: Payer: Self-pay

## 2024-10-23 NOTE — Telephone Encounter (Signed)
 Patient calls nurse line regarding concerns for blood in his stool.   He reports that for the last few weeks, after having BM, he notices small amount of blood (few drops) on top of stool. Denies abdominal pain or any large amounts of rectal bleeding.   He also reports that a few weeks ago he has a small amount of blood tinged sputum. This has since resolved.   Scheduled patient for tomorrow morning with Dr. Alba.   Patient is asking if he should hold his Xarelto  given concern. He has not yet taken his daily dose.   Please advise.   *ED precautions discussed.   Chiquita JAYSON English, RN

## 2024-10-23 NOTE — Telephone Encounter (Signed)
 Spoke with precepting physician, Dr. Rumball. She advised that patient could continue his Xarelto  for today and we could re evaluate tomorrow.   When speaking with patient, he voices that he is not able to come in tomorrow morning and is requesting PM appointment. Advised that Dr. Alba does not have PM availability. Patient is agreeable to seeing another provider. Scheduled with Dr. Diona tomorrow afternoon.   Patient appreciative.   Chiquita JAYSON English, RN

## 2024-10-24 ENCOUNTER — Ambulatory Visit (INDEPENDENT_AMBULATORY_CARE_PROVIDER_SITE_OTHER): Admitting: Family Medicine

## 2024-10-24 ENCOUNTER — Ambulatory Visit: Admitting: Family Medicine

## 2024-10-24 ENCOUNTER — Encounter: Payer: Self-pay | Admitting: Family Medicine

## 2024-10-24 VITALS — BP 145/82 | HR 93 | Ht 69.0 in | Wt 141.8 lb

## 2024-10-24 DIAGNOSIS — R195 Other fecal abnormalities: Secondary | ICD-10-CM | POA: Diagnosis not present

## 2024-10-24 DIAGNOSIS — Z7901 Long term (current) use of anticoagulants: Secondary | ICD-10-CM | POA: Diagnosis not present

## 2024-10-24 NOTE — Progress Notes (Unsigned)
    SUBJECTIVE:   CHIEF COMPLAINT / HPI:   Bleeding per rectum  On lovenox  Dark spots in stools x3 weeks - no blood on TP  Chronic years of bloody urine  No abdominal pain  No N/V  Couple weeks ago, coughing blood for 3-4 days , no fevers - red sputum, smaller  than a quarter - in hte morning - feels sore then better after morning   Some specks a few times since   No dizziness pre syncoper fatigue   PERTINENT  PMH / PSH: ***  OBJECTIVE:   There were no vitals taken for this visit.  ***  ASSESSMENT/PLAN:   Assessment & Plan      Craig Cassis, MD [facility]

## 2024-10-24 NOTE — Patient Instructions (Addendum)
 Thank you for visiting clinic today and allowing us  to participate in your care!  Today we discussed your cough and stool symptoms. We are checking in on your blood work today and Rodel keep you updated. Please continue taking all of your medications as prescribed in the meantime.   Consider using a humidifier when sleeping at night.   Please schedule an appointment with your PCP in 2 weeks or sooner if your symptoms are worsening.   Reach out any time with any questions or concerns you may have - we are here for you!  Damien Cassis, MD [facility] [phone]

## 2024-10-25 LAB — CBC
Hematocrit: 44.1 % (ref 37.5–51.0)
Hemoglobin: 14.6 g/dL (ref 13.0–17.7)
MCH: 34 pg — ABNORMAL HIGH (ref 26.6–33.0)
MCHC: 33.1 g/dL (ref 31.5–35.7)
MCV: 103 fL — ABNORMAL HIGH (ref 79–97)
Platelets: 242 x10E3/uL (ref 150–450)
RBC: 4.3 x10E6/uL (ref 4.14–5.80)
RDW: 11.8 % (ref 11.6–15.4)
WBC: 5.9 x10E3/uL (ref 3.4–10.8)

## 2024-10-25 LAB — FERRITIN: Ferritin: 63 ng/mL (ref 30–400)

## 2024-10-27 ENCOUNTER — Ambulatory Visit: Payer: Self-pay | Admitting: Family Medicine

## 2024-10-27 MED ORDER — POLYETHYLENE GLYCOL 3350 17 GM/SCOOP PO POWD: 17.0000 g | Freq: Every day | ORAL | 0 refills | Status: AC | PRN

## 2024-10-27 MED ORDER — IRON 325 (65 FE) MG PO TABS: 1.0000 | ORAL_TABLET | ORAL | 2 refills | Status: AC

## 2024-11-16 ENCOUNTER — Other Ambulatory Visit: Payer: Self-pay

## 2024-11-16 DIAGNOSIS — I10 Essential (primary) hypertension: Secondary | ICD-10-CM

## 2024-12-29 ENCOUNTER — Other Ambulatory Visit: Payer: Self-pay | Admitting: Family Medicine

## 2024-12-29 DIAGNOSIS — I82503 Chronic embolism and thrombosis of unspecified deep veins of lower extremity, bilateral: Secondary | ICD-10-CM
